# Patient Record
Sex: Male | Born: 1935 | Race: White | Hispanic: No | Marital: Married | Smoking: Former smoker
Health system: Southern US, Community
[De-identification: ages and names within clinical notes are randomized; demographics above are authoritative.]

## PROBLEM LIST (undated history)

## (undated) DIAGNOSIS — R569 Unspecified convulsions: Secondary | ICD-10-CM

## (undated) DIAGNOSIS — I10 Essential (primary) hypertension: Secondary | ICD-10-CM

## (undated) DIAGNOSIS — K5731 Diverticulosis of large intestine without perforation or abscess with bleeding: Secondary | ICD-10-CM

## (undated) DIAGNOSIS — C4490 Unspecified malignant neoplasm of skin, unspecified: Secondary | ICD-10-CM

## (undated) DIAGNOSIS — K5909 Other constipation: Secondary | ICD-10-CM

## (undated) DIAGNOSIS — N200 Calculus of kidney: Secondary | ICD-10-CM

## (undated) DIAGNOSIS — G454 Transient global amnesia: Secondary | ICD-10-CM

## (undated) DIAGNOSIS — K649 Unspecified hemorrhoids: Secondary | ICD-10-CM

## (undated) DIAGNOSIS — J189 Pneumonia, unspecified organism: Secondary | ICD-10-CM

## (undated) DIAGNOSIS — N400 Enlarged prostate without lower urinary tract symptoms: Secondary | ICD-10-CM

## (undated) DIAGNOSIS — F0390 Unspecified dementia without behavioral disturbance: Secondary | ICD-10-CM

## (undated) DIAGNOSIS — R350 Frequency of micturition: Secondary | ICD-10-CM

## (undated) DIAGNOSIS — F419 Anxiety disorder, unspecified: Secondary | ICD-10-CM

## (undated) DIAGNOSIS — I2699 Other pulmonary embolism without acute cor pulmonale: Secondary | ICD-10-CM

## (undated) HISTORY — PX: BLADDER SURGERY: SHX569

## (undated) HISTORY — PX: HEMORRHOID SURGERY: SHX153

## (undated) HISTORY — PX: SKIN CANCER EXCISION: SHX779

---

## 1973-10-16 HISTORY — PX: SUPRAPUBIC PROSTATECTOMY: SUR1056

## 1999-02-22 ENCOUNTER — Ambulatory Visit (HOSPITAL_COMMUNITY): Admission: RE | Admit: 1999-02-22 | Discharge: 1999-02-22 | Payer: Self-pay | Admitting: General Surgery

## 1999-02-22 ENCOUNTER — Encounter: Payer: Self-pay | Admitting: General Surgery

## 2001-02-15 ENCOUNTER — Encounter: Payer: Self-pay | Admitting: General Surgery

## 2001-02-15 ENCOUNTER — Encounter: Admission: RE | Admit: 2001-02-15 | Discharge: 2001-02-15 | Payer: Self-pay | Admitting: General Surgery

## 2001-02-28 ENCOUNTER — Inpatient Hospital Stay (HOSPITAL_COMMUNITY): Admission: RE | Admit: 2001-02-28 | Discharge: 2001-03-01 | Payer: Self-pay | Admitting: General Surgery

## 2001-06-22 ENCOUNTER — Inpatient Hospital Stay (HOSPITAL_COMMUNITY): Admission: EM | Admit: 2001-06-22 | Discharge: 2001-06-24 | Payer: Self-pay | Admitting: Urology

## 2001-06-22 ENCOUNTER — Encounter: Payer: Self-pay | Admitting: Urology

## 2008-02-16 ENCOUNTER — Emergency Department (HOSPITAL_COMMUNITY): Admission: EM | Admit: 2008-02-16 | Discharge: 2008-02-16 | Payer: Self-pay | Admitting: Emergency Medicine

## 2011-03-03 NOTE — Op Note (Signed)
[facility]  Patient:    [name], [name]                        MRN: [id number] Proc. Date: 02/28/01 Adm. Date:  [id number] Attending:  Janalyn Rouse                           Operative Report  PREOPERATIVE DIAGNOSES: 1. Left-sided spigelian hernia. 2. Chronic anal stricture.  POSTOPERATIVE DIAGNOSES: 1. Left-sided spigelian hernia. 2. Chronic anal stricture.  OPERATION: 1. Repair of spigelian hernia. 2. Left lateral internal sphincterotomy.  SURGEON:  Rose Phi. Maple Hudson, M.D.  ASSISTANT:  Andrey Spearman, M.D.  ANESTHESIA:  General.  DESCRIPTION OF PROCEDURE:  After suitable general anesthesia was induced, the patient was placed in a supine positive and abdomen prepped and draped in the usual fashion.  The bulge in the left side of his abdomen had been previously marked and with him prepped and draped, we made a transverse incision overlying this marked area.  I dissected through the subcutaneous tissue down to the external oblique fascia.  I then incised the lateral portion of the rectus fascia and then the external oblique fascia and then under this, we found the clear-cut defect representing the spigelian hernia.  It was not incarcerated.  Hernia was reduced, and he had good fascial and muscle margins which I then repaired with interrupted 2-0 Prolene sutures.  We then closed the external oblique fascia over this with 2-0 Prolene and closed the anterior fascia of the rectus muscle with similar sutures.  Then 3-0 Vicryl was used in the subcutaneous tissue and staples for the skin.  We then put him up in the lithotomy position and prepped and draped out the perineum.  He had a very tight anal sphincter and with some time and gentle dilation, I was able to get the little finger in followed by the index finger and then was able to get the large bullet rectal retractor in.  With him dilated like this I then, using a #11 knife blade, did a  lateral internal sphincterotomy.  Pressure controlled the bleeding.  I was able actually to put two fingers in following this.  I think we helped relieve the stricture.  A small dressing was applied, and the patient was transferred to the recovery room in satisfactory condition having tolerated the procedure well. DD:  02/28/01 TD:  02/28/01 Job: [id number]

## 2011-03-03 NOTE — H&P (Signed)
[facility]  Patient:    [name], [name] Visit Number: [id number] MRN: [id number]          Service Type: MED Location: 3W 0343 02 Attending Physician:  Londell Moh Dictated by:   Jamison Neighbor, M.D. Admit Date:  06/22/2001   CC:         Miguel Aschoff, M.D.   History and Physical  ADMITTING DIAGNOSES: 1. Dehydration. 2. Nausea. 3. Possible kidney stone.  HISTORY:  This 77 year old male passed his first kidney stone Wednesday of this past week.  The patient passed this without much difficulty, but has had some problems with intermittent nausea and pain since that time.  The patient presented to the [facility] outpatient clinic at [facility], where he was seen by Dr. Anner Crete.  She felt that the patient was dehydrated and had some flank pain and was concerned that perhaps the patient had another stone.  She obtained a KUB which did not show a stone.  She discussed this with the patients attending physician, Dr. Miguel Aschoff, who felt that the patient should be admitted by urology.  We made arrangements for the patient to be a direct admit to the [facility] for IV fluids and further evaluation.  At the patients initial assessment he did complain of mild nausea.  He had no hypertension and his urine, _________ , while microscopically bloody, was grossly normal.  PAST MEDICAL HISTORY:  Quite unremarkable.  The patient does have some chronic constipation and takes Miralax regularly.  He also takes Ultram p.r.n. for pain.  ALLERGIES:  He has an allergy to MORPHINE, _________ , INDOCIN, CODEINE, ATIVAN, and a medication that he called OMNICEN, which may be a penicillin.  Remarkable for hospitalization at age 88 for 13 days with pneumonia.  He had tonsils removed in 1946, hemorrhoids in 1969, suprapubic prostatectomy by Dr. Retia Passe, Sr. in 1975, eye surgery by Dr. Cecilie Kicks in 1989, history of a diverticulitis in 1994.   He underwent sphincterotomy in 1995.  He was admitted for an episode of transient global amnesia in 1996 and earlier this year, he had an abdominal hernia repair, as well as an internal sphincterotomy performed.  PHYSICAL EXAMINATION:  GENERAL:  On examination he is well-developed, well-nourished male in no acute distress.  HEENT:  Normocephalic, atraumatic.  Cranial nerves 2-12 appear grossly intact.  NECK:  Supple, no adenopathy or thyromegaly.  LUNGS:  Clear.  HEART:  Heart had a regular rate and rhythm.  No murmurs, thrills, gallops, rubs, or heaves.  ABDOMEN:  Soft, nontender, with no palpable masses, rebound, or guarding.  GENITALIA:  Testicles are normal size, shape, consistency.  EXTREMITIES:  No cyanosis, clubbing, or edema.  NEUROLOGIC:  Appears grossly intact.  LABORATORY:  CT scan has been ordered.  Laboratory studies have been ordered.  IMPRESSION: 1. Possible kidney stones. 2. Microscopic hematuria secondary to past kidney stone. 3. Residual flank pain and nausea secondary to passage of stone. Dictated by:   Jamison Neighbor, M.D. Attending Physician:  Londell Moh DD:  06/23/01 TD:  06/23/01 Job: [id number]

## 2011-03-03 NOTE — Discharge Summary (Signed)
[facility]  Patient:    [name], STILLE Visit Number: [id number] MRN: [id number]          Service Type: MED Location: 3W 0343 02 Attending Physician:  Londell Moh Dictated by:   Jamison Neighbor, M.D. Admit Date:  06/22/2001 Discharge Date: 06/24/2001   CC:         Charles A. Tenny Craw, M.D., [facility]   Discharge Summary  SERVICE:  Urology  DISCHARGE DIAGNOSES: 1. Dehydration. 2. Nausea. 3. Ureteral calculus.  HISTORY:  This 77 year old male passed the first kidney stone on Wednesday of the week of admission.  The patient has had problems with intermittent nausea and pain since that time.  Patient was seen by Dr. Anner Crete at the [facility] who felt that he was dehydrated.  She spoke with his attending physician, Dr. Bing Neighbors. Ross, who thought that he should be admitted by urology.  The patient was a direct admission to the hospital for IV fluids and further evaluation.  Patient had no hypertension and did have some mild nausea at the time of admission.  His urine was grossly normal but microscopically, he did have positive rbcs.  HOSPITAL COURSE:  Patient was admitted and started on IV fluids.  A CT scan was obtained which did show a stone in the distal ureter.  Patient did not have a lot of pain and elected to try and pass the stone on his own.  The patient was sent home to try and pass the stone without intervention at this time.  He does know that if he does not pass the stone in the next week or two, he will require ureteroscopic examination.  The patient was completely asymptomatic.  FOLLOWUP:  The patient will return to the office in one to two weeks time for followup.Dictated by:   Jamison Neighbor, M.D. Attending Physician:  Londell Moh DD:  07/02/01 TD:  07/02/01 Job: [id number]

## 2011-03-03 NOTE — Discharge Summary (Signed)
[facility]  Patient:    [name], [name]                        MRN: [id number] Adm. Date:  [id number] Disc. Date: [id number] Attending:  Janalyn Rouse                           Discharge Summary  HISTORY OF PRESENT ILLNESS:  This 77 year old male had a long history of chronic anal fissures with strictures secondary to hemorrhoidectomy done a long time ago.  On periodic occasions we have had to do sphincterotomies.  In addition, he has developed what appears to be a left spigelian hernia.  We have scheduled him now for repair of that and sphincterotomy.  There were no other physical findings.  His laboratory data was all within normal limits.  HOSPITAL COURSE:  Shortly after admission, he was taken to the operating room where a repair of a spigelian hernia on the left side was carried out and a lateral internal sphincterotomy.  Postoperatively, he did very well and was discharged the following day as an outpatient.  DISCHARGE DIAGNOSES: 1. Spigelian hernia. 2. Chronic anal stricture.  PROCEDURES: 1. Spigelian hernia repair. 2. Lateral internal sphincterotomy.  COMPLICATIONS:  None.  CONDITION ON DISCHARGE:  Improved.  DISCHARGE MEDICATIONS:  None.  DIET:  Regular.  ACTIVITY:  Limited.  FOLLOWUP:  To be seen in one week in the office. DD:  03/14/01 TD:  03/14/01 Job: [id number]

## 2012-09-19 ENCOUNTER — Emergency Department (HOSPITAL_COMMUNITY)
Admission: EM | Admit: 2012-09-19 | Discharge: 2012-09-19 | Discharge status: Against Medical Advice | Payer: Medicare Other | Attending: Emergency Medicine | Admitting: Emergency Medicine

## 2012-09-19 ENCOUNTER — Encounter (HOSPITAL_COMMUNITY): Payer: Self-pay | Admitting: *Deleted

## 2012-09-19 DIAGNOSIS — K6289 Other specified diseases of anus and rectum: Secondary | ICD-10-CM | POA: Insufficient documentation

## 2012-09-19 NOTE — ED Notes (Signed)
Pt signed discharge papers to accompany wife home.

## 2012-09-19 NOTE — ED Notes (Signed)
Pt c/o rectal pain that started "a good while ago but its getting worse and worse lately." pt denies blood in stool, reports pain with BM.

## 2012-09-27 ENCOUNTER — Emergency Department (HOSPITAL_COMMUNITY)
Admission: EM | Admit: 2012-09-27 | Discharge: 2012-09-27 | Disposition: A | Discharge status: Home / Self Care | Payer: Medicare Other | Attending: Emergency Medicine | Admitting: Emergency Medicine

## 2012-09-27 ENCOUNTER — Emergency Department (HOSPITAL_COMMUNITY): Payer: Medicare Other

## 2012-09-27 ENCOUNTER — Encounter (HOSPITAL_COMMUNITY): Payer: Self-pay | Admitting: Emergency Medicine

## 2012-09-27 DIAGNOSIS — R300 Dysuria: Secondary | ICD-10-CM | POA: Insufficient documentation

## 2012-09-27 DIAGNOSIS — Z79899 Other long term (current) drug therapy: Secondary | ICD-10-CM | POA: Insufficient documentation

## 2012-09-27 DIAGNOSIS — N390 Urinary tract infection, site not specified: Secondary | ICD-10-CM | POA: Insufficient documentation

## 2012-09-27 DIAGNOSIS — H9190 Unspecified hearing loss, unspecified ear: Secondary | ICD-10-CM | POA: Insufficient documentation

## 2012-09-27 DIAGNOSIS — R110 Nausea: Secondary | ICD-10-CM | POA: Insufficient documentation

## 2012-09-27 DIAGNOSIS — I10 Essential (primary) hypertension: Secondary | ICD-10-CM | POA: Insufficient documentation

## 2012-09-27 DIAGNOSIS — Z87448 Personal history of other diseases of urinary system: Secondary | ICD-10-CM | POA: Insufficient documentation

## 2012-09-27 DIAGNOSIS — F411 Generalized anxiety disorder: Secondary | ICD-10-CM | POA: Insufficient documentation

## 2012-09-27 HISTORY — DX: Essential (primary) hypertension: I10

## 2012-09-27 HISTORY — DX: Anxiety disorder, unspecified: F41.9

## 2012-09-27 LAB — URINALYSIS, ROUTINE W REFLEX MICROSCOPIC
Nitrite: NEGATIVE
Specific Gravity, Urine: 1.018 (ref 1.005–1.030)
Urobilinogen, UA: 0.2 mg/dL (ref 0.0–1.0)

## 2012-09-27 LAB — OCCULT BLOOD, POC DEVICE: Fecal Occult Bld: NEGATIVE

## 2012-09-27 LAB — COMPREHENSIVE METABOLIC PANEL
ALT: 15 U/L (ref 0–53)
Albumin: 3.5 g/dL (ref 3.5–5.2)
Alkaline Phosphatase: 34 U/L — ABNORMAL LOW (ref 39–117)
Potassium: 3.6 mEq/L (ref 3.5–5.1)
Sodium: 138 mEq/L (ref 135–145)
Total Protein: 5.9 g/dL — ABNORMAL LOW (ref 6.0–8.3)

## 2012-09-27 LAB — CBC WITH DIFFERENTIAL/PLATELET
Basophils Absolute: 0 10*3/uL (ref 0.0–0.1)
Basophils Relative: 0 % (ref 0–1)
Hemoglobin: 15 g/dL (ref 13.0–17.0)
MCHC: 33.1 g/dL (ref 30.0–36.0)
Neutro Abs: 6.6 10*3/uL (ref 1.7–7.7)
Neutrophils Relative %: 82 % — ABNORMAL HIGH (ref 43–77)
RDW: 14.5 % (ref 11.5–15.5)

## 2012-09-27 LAB — URINE MICROSCOPIC-ADD ON

## 2012-09-27 LAB — GLUCOSE, CAPILLARY: Glucose-Capillary: 87 mg/dL (ref 70–99)

## 2012-09-27 MED ORDER — SODIUM CHLORIDE 0.9 % IV BOLUS (SEPSIS)
1000.0000 mL | Freq: Once | INTRAVENOUS | Status: AC
  Administered 2012-09-27: 1000 mL via INTRAVENOUS

## 2012-09-27 MED ORDER — CEFUROXIME AXETIL 500 MG PO TABS: 500.0000 mg | ORAL_TABLET | Freq: Two times a day (BID) | ORAL | Status: DC

## 2012-09-27 MED ORDER — CEFTRIAXONE SODIUM 1 G IJ SOLR
1.0000 g | INTRAMUSCULAR | Status: DC
  Administered 2012-09-27: 1 g via INTRAVENOUS
  Filled 2012-09-27: qty 10

## 2012-09-27 NOTE — ED Notes (Signed)
Per EMS, patients states generalized weakness for a couple of days-"just doesn't feel well"

## 2012-09-27 NOTE — ED Notes (Signed)
CBG registered 87 on ED Glucometer 

## 2012-09-27 NOTE — ED Notes (Signed)
Pt aware of the need for a urine sample. Urinal at bedside. 

## 2012-09-27 NOTE — ED Provider Notes (Signed)
Complains of generalized weakness and "I feel sick" since 6 PM yesterday he denies chest pain denies cough denies fever denies abdominal pain denies pain anywhere no treatment prior to coming here bowel movements normal nothing makes symptoms better or worse. On exam pleasant cooperative Glasgow Coma Score 15 nontoxic-appearing HEENT exam mucous membranes dry. Eyes extraocular muscles intact no facial asymmetry neck supple no bruit lungs clear auscultation heart regular rate and no murmurs abdomen nondistended nontender all 4 extremities are redness or tenderness neurovascular intact neurologic cranial nerves II through XII grossly intact Glasgow Coma Score 15 moves all extremities gait normal. Becomes lightheaded upon standing  Doug Sou, MD 09/27/12 1139

## 2012-09-27 NOTE — ED Provider Notes (Signed)
History     CSN: [id number]  Arrival date & time 09/27/12  1039   First MD Initiated Contact with Patient 09/27/12 1052      Chief Complaint  Patient presents with  . Weakness    (Consider location/radiation/quality/duration/timing/severity/associated sxs/prior treatment) Patient is a 78 y.o. male presenting with weakness. The history is provided by the patient. No language interpreter was used.  Weakness The primary symptoms include nausea. Primary symptoms do not include headaches, loss of consciousness, seizures, dizziness, visual change, fever or vomiting.  Additional symptoms include weakness and hearing loss. Additional symptoms do not include pain or lower back pain. Medical issues do not include cancer or diabetes.   78 yo male with pmh of prostate problems, anxiety, and hypertension here via EMS from home with c/o generalized weakness since yesterday when he felt nauseated for a short while.  States that he thinks he has pneumonia.  Denies fever, SOB or cough, pain, blood in stool.   He takes care of his wife who had pagets disease and demnetia.  The neighbor came over to take care of her so he could come to the hospital.  He has a daughter in [place] who will come as soon as she can. He has an unsteady gait moving from EMS stretcher but does not use cane or walker at home.  Remote hx of pneumonia at 79 yo.   Normally has difficulty urinating.    Past Medical History  Diagnosis Date  . Hypertension   . Anxiety     No past surgical history on file.  No family history on file.  History  Substance Use Topics  . Smoking status: Not on file  . Smokeless tobacco: Not on file  . Alcohol Use: No      Review of Systems  Constitutional: Negative for fever.  HENT: Positive for hearing loss. Negative for congestion, sore throat, rhinorrhea, sneezing, trouble swallowing, neck pain and postnasal drip.   Eyes: Negative.   Respiratory: Negative.  Negative for cough and  shortness of breath.   Cardiovascular: Negative.  Negative for chest pain and leg swelling.  Gastrointestinal: Positive for nausea. Negative for vomiting, abdominal pain and blood in stool.  Genitourinary: Positive for difficulty urinating. Negative for dysuria, hematuria and testicular pain.  Neurological: Positive for weakness. Negative for dizziness, seizures, loss of consciousness, syncope, light-headedness and headaches.  Psychiatric/Behavioral: Negative.   All other systems reviewed and are negative.    Allergies  Review of patient's allergies indicates no known allergies.  Home Medications   Current Outpatient Rx  Name  Route  Sig  Dispense  Refill  . ALPRAZOLAM 0.5 MG PO TABS   Oral   Take 0.5 mg by mouth at bedtime as needed. ANXIETY/SLEEP         . AMLODIPINE BESYLATE 5 MG PO TABS   Oral   Take 5 mg by mouth daily.         Marland Kitchen LOSARTAN POTASSIUM 100 MG PO TABS   Oral   Take 100 mg by mouth daily.         Marland Kitchen TAMSULOSIN HCL 0.4 MG PO CAPS   Oral   Take 0.4 mg by mouth daily.         . TELMISARTAN 80 MG PO TABS   Oral   Take 80 mg by mouth daily.         . TRAMADOL HCL 50 MG PO TABS   Oral   Take 100 mg by mouth  daily.           BP 164/75  Pulse 69  Temp 98 F (36.7 C) (Oral)  Resp 18  SpO2 98%  Physical Exam  Nursing note and vitals reviewed. Constitutional: He is oriented to person, place, and time. He appears well-developed and well-nourished.  HENT:  Head: Normocephalic.  Eyes: Conjunctivae normal and EOM are normal. Pupils are equal, round, and reactive to light.  Neck: Normal range of motion. Neck supple. No tracheal deviation present. No thyromegaly present.  Cardiovascular: Normal rate.   Pulmonary/Chest: Effort normal and breath sounds normal. No respiratory distress. He has no wheezes. He has no rales.  Abdominal: Soft. Bowel sounds are normal. He exhibits no distension. There is no tenderness.  Musculoskeletal: Normal range of  motion.  Neurological: He is alert and oriented to person, place, and time.  Skin: Skin is warm and dry.  Psychiatric: He has a normal mood and affect.    ED Course  Procedures (including critical care time)   Labs Reviewed  GLUCOSE, CAPILLARY  CBC WITH DIFFERENTIAL  URINALYSIS, ROUTINE W REFLEX MICROSCOPIC  COMPREHENSIVE METABOLIC PANEL   No results found.   No diagnosis found.    MDM   Date: 09/27/2012  Rate: 69  Rhythm: normal sinus rhythm  QRS Axis: right  Intervals: normal  ST/T Wave abnormalities: normal  Conduction Disutrbances:first-degree A-V block   Narrative Interpretation:   Old EKG Reviewed: changes noted   Labs Reviewed  CBC WITH DIFFERENTIAL - Abnormal; Notable for the following:    Neutrophils Relative 82 (*)     Lymphocytes Relative 10 (*)     All other components within normal limits  COMPREHENSIVE METABOLIC PANEL - Abnormal; Notable for the following:    Total Protein 5.9 (*)     Alkaline Phosphatase 34 (*)     GFR calc non Af Amer 73 (*)     GFR calc Af Amer 85 (*)     All other components within normal limits  GLUCOSE, CAPILLARY  TROPONIN I  URINALYSIS, ROUTINE W REFLEX MICROSCOPIC  OCCULT BLOOD X 1 CARD TO LAB, STOOL   78 yo male with c/o "feeling weak".  + UTI with 7-10 and hematuria. Urine culture pending.  1gm IV rocephen in the er.  rx for ceftin.  He will follow up with Dr. Tenny Craw this week.  He understands to return to ER for fever, nausea and vomiting.   EKG and troponin unremarkable.  Chest x-ray unremarkable and reviewed by myself.  WBC 8.   Shared visit with Dr. Rennis Chris.   Labs Reviewed  CBC WITH DIFFERENTIAL - Abnormal; Notable for the following:    Neutrophils Relative 82 (*)     Lymphocytes Relative 10 (*)     All other components within normal limits  URINALYSIS, ROUTINE W REFLEX MICROSCOPIC - Abnormal; Notable for the following:    APPearance CLOUDY (*)     Hgb urine dipstick LARGE (*)     Ketones, ur TRACE (*)      Leukocytes, UA SMALL (*)     All other components within normal limits  COMPREHENSIVE METABOLIC PANEL - Abnormal; Notable for the following:    Total Protein 5.9 (*)     Alkaline Phosphatase 34 (*)     GFR calc non Af Amer 73 (*)     GFR calc Af Amer 85 (*)     All other components within normal limits  URINE MICROSCOPIC-ADD ON - Abnormal; Notable for the following:  Squamous Epithelial / LPF FEW (*)     Bacteria, UA FEW (*)     All other components within normal limits  GLUCOSE, CAPILLARY  TROPONIN I  OCCULT BLOOD, POC DEVICE  OCCULT BLOOD X 1 CARD TO LAB, STOOL  URINE CULTURE          Remi Haggard, NP 09/27/12 1903

## 2012-09-27 NOTE — ED Notes (Signed)
[facility] called  

## 2012-09-28 LAB — URINE CULTURE: Colony Count: 25000

## 2012-10-16 DIAGNOSIS — K5731 Diverticulosis of large intestine without perforation or abscess with bleeding: Secondary | ICD-10-CM

## 2012-10-16 HISTORY — DX: Diverticulosis of large intestine without perforation or abscess with bleeding: K57.31

## 2012-10-31 NOTE — ED Provider Notes (Signed)
Medical screening examination/treatment/procedure(s) were conducted as a shared visit with non-physician practitioner(s) and myself.  I personally evaluated the patient during the encounter  Doug Sou, MD 10/31/12 [phone]

## 2013-01-26 ENCOUNTER — Inpatient Hospital Stay (HOSPITAL_COMMUNITY)
Admission: EM | Admit: 2013-01-26 | Discharge: 2013-01-30 | DRG: 378 | Disposition: A | Discharge status: Home Health | Payer: Medicare Other | Attending: Internal Medicine | Admitting: Internal Medicine

## 2013-01-26 ENCOUNTER — Encounter (HOSPITAL_COMMUNITY): Payer: Self-pay | Admitting: Emergency Medicine

## 2013-01-26 DIAGNOSIS — L8991 Pressure ulcer of unspecified site, stage 1: Secondary | ICD-10-CM | POA: Diagnosis present

## 2013-01-26 DIAGNOSIS — D375 Neoplasm of uncertain behavior of rectum: Secondary | ICD-10-CM | POA: Diagnosis present

## 2013-01-26 DIAGNOSIS — D371 Neoplasm of uncertain behavior of stomach: Secondary | ICD-10-CM | POA: Diagnosis present

## 2013-01-26 DIAGNOSIS — K624 Stenosis of anus and rectum: Secondary | ICD-10-CM

## 2013-01-26 DIAGNOSIS — D62 Acute posthemorrhagic anemia: Secondary | ICD-10-CM | POA: Diagnosis present

## 2013-01-26 DIAGNOSIS — F0390 Unspecified dementia without behavioral disturbance: Secondary | ICD-10-CM | POA: Diagnosis present

## 2013-01-26 DIAGNOSIS — K5731 Diverticulosis of large intestine without perforation or abscess with bleeding: Secondary | ICD-10-CM

## 2013-01-26 DIAGNOSIS — L89899 Pressure ulcer of other site, unspecified stage: Secondary | ICD-10-CM | POA: Diagnosis present

## 2013-01-26 DIAGNOSIS — R5381 Other malaise: Secondary | ICD-10-CM | POA: Diagnosis present

## 2013-01-26 DIAGNOSIS — K5733 Diverticulitis of large intestine without perforation or abscess with bleeding: Principal | ICD-10-CM | POA: Diagnosis present

## 2013-01-26 DIAGNOSIS — K5900 Constipation, unspecified: Secondary | ICD-10-CM | POA: Diagnosis present

## 2013-01-26 DIAGNOSIS — I10 Essential (primary) hypertension: Secondary | ICD-10-CM | POA: Diagnosis present

## 2013-01-26 DIAGNOSIS — N138 Other obstructive and reflux uropathy: Secondary | ICD-10-CM | POA: Diagnosis present

## 2013-01-26 DIAGNOSIS — L89109 Pressure ulcer of unspecified part of back, unspecified stage: Secondary | ICD-10-CM | POA: Diagnosis present

## 2013-01-26 DIAGNOSIS — H9190 Unspecified hearing loss, unspecified ear: Secondary | ICD-10-CM | POA: Diagnosis present

## 2013-01-26 DIAGNOSIS — K922 Gastrointestinal hemorrhage, unspecified: Secondary | ICD-10-CM

## 2013-01-26 DIAGNOSIS — N401 Enlarged prostate with lower urinary tract symptoms: Secondary | ICD-10-CM | POA: Diagnosis present

## 2013-01-26 DIAGNOSIS — K5909 Other constipation: Secondary | ICD-10-CM | POA: Diagnosis present

## 2013-01-26 DIAGNOSIS — Z681 Body mass index (BMI) 19 or less, adult: Secondary | ICD-10-CM

## 2013-01-26 DIAGNOSIS — I9589 Other hypotension: Secondary | ICD-10-CM | POA: Diagnosis present

## 2013-01-26 HISTORY — DX: Benign prostatic hyperplasia without lower urinary tract symptoms: N40.0

## 2013-01-26 HISTORY — DX: Other constipation: K59.09

## 2013-01-26 LAB — CBC
HCT: 27.9 % — ABNORMAL LOW (ref 39.0–52.0)
Hemoglobin: 8.9 g/dL — ABNORMAL LOW (ref 13.0–17.0)
Hemoglobin: 9.4 g/dL — ABNORMAL LOW (ref 13.0–17.0)
MCH: 28.9 pg (ref 26.0–34.0)
MCH: 28.3 pg (ref 26.0–34.0)
MCH: 28.5 pg (ref 26.0–34.0)
MCHC: 32.2 g/dL (ref 30.0–36.0)
MCV: 90.6 fL (ref 78.0–100.0)
MCV: 87.5 fL (ref 78.0–100.0)
Platelets: 136 10*3/uL — ABNORMAL LOW (ref 150–400)
Platelets: 126 10*3/uL — ABNORMAL LOW (ref 150–400)
RBC: 3.08 MIL/uL — ABNORMAL LOW (ref 4.22–5.81)
RBC: 3.12 MIL/uL — ABNORMAL LOW (ref 4.22–5.81)
RDW: 15.7 % — ABNORMAL HIGH (ref 11.5–15.5)
RDW: 15.8 % — ABNORMAL HIGH (ref 11.5–15.5)
WBC: 7.6 10*3/uL (ref 4.0–10.5)
WBC: 8.1 10*3/uL (ref 4.0–10.5)

## 2013-01-26 LAB — COMPREHENSIVE METABOLIC PANEL
ALT: 8 U/L (ref 0–53)
AST: 13 U/L (ref 0–37)
CO2: 27 mEq/L (ref 19–32)
Calcium: 8.2 mg/dL — ABNORMAL LOW (ref 8.4–10.5)
Chloride: 110 mEq/L (ref 96–112)
Creatinine, Ser: 1.07 mg/dL (ref 0.50–1.35)
GFR calc Af Amer: 69 mL/min — ABNORMAL LOW (ref >90)
GFR calc non Af Amer: 59 mL/min — ABNORMAL LOW (ref >90)
Glucose, Bld: 113 mg/dL — ABNORMAL HIGH (ref 70–99)
Total Bilirubin: 0.2 mg/dL — ABNORMAL LOW (ref 0.3–1.2)

## 2013-01-26 LAB — PREPARE RBC (CROSSMATCH)

## 2013-01-26 LAB — MRSA PCR SCREENING: MRSA by PCR: NEGATIVE

## 2013-01-26 LAB — TSH: TSH: 1.081 u[IU]/mL (ref 0.350–4.500)

## 2013-01-26 MED ORDER — ONDANSETRON HCL 4 MG/2ML IJ SOLN: 4.0000 mg | Freq: Four times a day (QID) | INTRAMUSCULAR | Status: DC | PRN

## 2013-01-26 MED ORDER — ONDANSETRON HCL 4 MG PO TABS: 4.0000 mg | ORAL_TABLET | Freq: Four times a day (QID) | ORAL | Status: DC | PRN

## 2013-01-26 MED ORDER — DEXTROSE-NACL 5-0.9 % IV SOLN
INTRAVENOUS | Status: DC
  Administered 2013-01-26 – 2013-01-30 (×4): via INTRAVENOUS

## 2013-01-26 MED ORDER — TAMSULOSIN HCL 0.4 MG PO CAPS
0.4000 mg | ORAL_CAPSULE | Freq: Every day | ORAL | Status: DC
  Administered 2013-01-26 – 2013-01-30 (×5): 0.4 mg via ORAL
  Filled 2013-01-26 (×5): qty 1

## 2013-01-26 MED ORDER — LACTATED RINGERS IV BOLUS (SEPSIS)
1000.0000 mL | Freq: Once | INTRAVENOUS | Status: AC
  Administered 2013-01-26: 1000 mL via INTRAVENOUS

## 2013-01-26 MED ORDER — IRBESARTAN 300 MG PO TABS
300.0000 mg | ORAL_TABLET | Freq: Every day | ORAL | Status: DC
  Administered 2013-01-26 – 2013-01-27 (×2): 300 mg via ORAL
  Filled 2013-01-26 (×2): qty 1

## 2013-01-26 MED ORDER — TRAMADOL HCL 50 MG PO TABS
50.0000 mg | ORAL_TABLET | Freq: Three times a day (TID) | ORAL | Status: DC | PRN
Start: 2013-01-26 — End: 2013-01-30
  Administered 2013-01-26 – 2013-01-29 (×4): 50 mg via ORAL
  Filled 2013-01-26 (×5): qty 1

## 2013-01-26 MED ORDER — SODIUM CHLORIDE 0.9 % IJ SOLN
3.0000 mL | Freq: Two times a day (BID) | INTRAMUSCULAR | Status: DC
  Administered 2013-01-26 – 2013-01-27 (×2): 3 mL via INTRAVENOUS
  Administered 2013-01-27: 09:00:00 via INTRAVENOUS
  Administered 2013-01-28 – 2013-01-29 (×2): 3 mL via INTRAVENOUS

## 2013-01-26 MED ORDER — LOSARTAN POTASSIUM 50 MG PO TABS
100.0000 mg | ORAL_TABLET | Freq: Every day | ORAL | Status: DC
  Administered 2013-01-26 – 2013-01-30 (×5): 100 mg via ORAL
  Filled 2013-01-26 (×5): qty 2

## 2013-01-26 MED ORDER — AMLODIPINE BESYLATE 5 MG PO TABS
5.0000 mg | ORAL_TABLET | Freq: Every day | ORAL | Status: DC
  Administered 2013-01-26: 5 mg via ORAL
  Filled 2013-01-26 (×2): qty 1

## 2013-01-26 MED ORDER — ALPRAZOLAM 0.5 MG PO TABS
0.5000 mg | ORAL_TABLET | Freq: Three times a day (TID) | ORAL | Status: DC | PRN
  Administered 2013-01-26 – 2013-01-29 (×4): 0.5 mg via ORAL
  Filled 2013-01-26 (×4): qty 1

## 2013-01-26 NOTE — Consult Note (Signed)
Consult for [facility]  Reason for Consult:Hematochezia and Anemia Referring Physician: [facility]  Donia Ast HPI: This is an 79 year old male with complaints of hematochezia.  The bleeding was painless and he denied any issues with melena.  His HGB dropped from the 15 range in 09/2012 down to 8.9 on admission.  The bleeding started at 8 PM yesterday and it continues to this time.  In fact, he reports having a bloody bowel movement upon his arrival into his room a few minutes ago.  A few years ago he reports having a similar episode, but he does not recall the work up or any other details associated with the bleeding event.  In the past he reports having a negative colonoscopy, but he cannot recall the time or indication for the procedure.  Past Medical History  Diagnosis Date  . Hypertension   . Anxiety   . BPH (benign prostatic hypertrophy)   . Constipation, chronic     Past Surgical History  Procedure Laterality Date  . Prostate surgery    . Tonsilectomy/adenoidectomy with myringotomy      No family history on file.  Social History:  reports that he has never smoked. He has never used smokeless tobacco. He reports that he does not drink alcohol or use illicit drugs.  Allergies:  Allergies  Allergen Reactions  . Morphine And Related Nausea And Vomiting    Medications:  Scheduled: . amLODipine  5 mg Oral Daily  . irbesartan  300 mg Oral Daily  . losartan  100 mg Oral Daily  . sodium chloride  3 mL Intravenous Q12H  . tamsulosin  0.4 mg Oral Daily   Continuous: . dextrose 5 % and 0.9% NaCl      Results for orders placed during the hospital encounter of 01/26/13 (from the past 24 hour(s))  CBC     Status: Abnormal   Collection Time    01/26/13  3:30 AM      Result Value Range   WBC 7.6  4.0 - 10.5 K/uL   RBC 3.08 (*) 4.22 - 5.81 MIL/uL   Hemoglobin 8.9 (*) 13.0 - 17.0 g/dL   HCT 16.1 (*) 09.6 - 04.5 %   MCV 90.6  78.0 - 100.0 fL   MCH 28.9  26.0 - 34.0  pg   MCHC 31.9  30.0 - 36.0 g/dL   RDW 40.9  81.1 - 91.4 %   Platelets 136 (*) 150 - 400 K/uL  COMPREHENSIVE METABOLIC PANEL     Status: Abnormal   Collection Time    01/26/13  3:30 AM      Result Value Range   Sodium 143  135 - 145 mEq/L   Potassium 4.3  3.5 - 5.1 mEq/L   Chloride 110  96 - 112 mEq/L   CO2 27  19 - 32 mEq/L   Glucose, Bld 113 (*) 70 - 99 mg/dL   BUN 30 (*) 6 - 23 mg/dL   Creatinine, Ser 7.82  0.50 - 1.35 mg/dL   Calcium 8.2 (*) 8.4 - 10.5 mg/dL   Total Protein 4.9 (*) 6.0 - 8.3 g/dL   Albumin 2.9 (*) 3.5 - 5.2 g/dL   AST 13  0 - 37 U/L   ALT 8  0 - 53 U/L   Alkaline Phosphatase 33 (*) 39 - 117 U/L   Total Bilirubin 0.2 (*) 0.3 - 1.2 mg/dL   GFR calc non Af Amer 59 (*) >90 mL/min   GFR  calc Af Amer 69 (*) >90 mL/min  TYPE AND SCREEN     Status: None   Collection Time    01/26/13  3:30 AM      Result Value Range   ABO/RH(D) A POS     Antibody Screen NEG     Sample Expiration 01/29/2013     Unit Number [id number]     Blood Component Type RED CELLS,LR     Unit division 00     Status of Unit ALLOCATED     Transfusion Status OK TO TRANSFUSE     Crossmatch Result Compatible     Unit Number [id number]     Blood Component Type RED CELLS,LR     Unit division 00     Status of Unit ISSUED     Transfusion Status OK TO TRANSFUSE     Crossmatch Result Compatible    ABO/RH     Status: None   Collection Time    01/26/13  3:37 AM      Result Value Range   ABO/RH(D) A POS    PREPARE RBC (CROSSMATCH)     Status: None   Collection Time    01/26/13  5:00 AM      Result Value Range   Order Confirmation ORDER PROCESSED BY BLOOD BANK       No results found.  ROS:  As stated above in the HPI otherwise negative.  Blood pressure 163/74, pulse 64, temperature 98 F (36.7 C), temperature source Oral, resp. rate 24, height 5\' 11"  (1.803 m), weight 133 lb 6.1 oz (60.5 kg), SpO2 100.00%.    PE: Gen: NAD, Alert and Oriented HEENT:  [place]/AT, EOMI Neck: Supple, no  LAD Lungs: CTA Bilaterally CV: RRR without M/G/R ABM: Soft, NTND, +BS Ext: No C/C/E  Assessment/Plan: 1) Probable diverticular bleed. 2) Constipation. 3) HTN.   The patient's clinical presentation is consistent with a diverticular bleed.  I do not feel he needs an endoscopic evaluation at this time.  In 2002 his CT scan did reveal sigmoid diverticula.  Plan: 1) Follow HGB. 2) Transfuse if necessary.  Madiline Saffran D 01/26/2013, 9:02 AM

## 2013-01-26 NOTE — Progress Notes (Signed)
Subjective: Pt recently transferred to Dr Alphonsus Sias are in our practice in early FEB. Excellent eval by Dr Conley Rolls is appreciated. We are glad to take over care  Still passing blood, painlessly. No abd pain. No nausea. BP has been stable No CP or SOB. Not yet seen by GI. Hgb better after 1 unit transfusion   FROM OUR RECORDS: Past History Past Medical History (reviewed - no changes required): unstable gait  pneumonia  hemorrhoids ??  BPH rectal pain  Dementia Hypertension Recurrent UTI Colonoscopy History:        Results:  Has had 30 yrs ago   Td Vaccine: Done (05/15/1992 1:30:42 PM) Pneumovax: 2012 (10/17/2010 1:53:46 PM) Zostavax: Unsure (10/17/2010 1:53:46 PM)  Surgical History (reviewed - no changes required): hemorrhoidectomy ?? bladder surgery for bladder diverticulum 1975 suprapubic prostatectomy for BPH 1975  Family History (reviewed - no changes required): siblings: stomach CA, DM  Social History (reviewed - no changes required): married to helene Babe. retired Electronics engineer. college education  COLACE 50 MG CAPS (DOCUSATE SODIUM) take 1 daily  ALIGN 4 MG CAPS (PROBIOTIC PRODUCT) take 1 cap daily        ANUSOL-HC SUPP (HYDROCORTISONE ACETATE SUPP) BID        ALPRAZOLAM 0.5 MG TABS (ALPRAZOLAM) take 1 tab qhs prn  ANUSOL-HC 2.5 % CREA (HYDROCORTISONE) apply BID prn        LOSARTAN POTASSIUM 100 MG TABS (LOSARTAN POTASSIUM) take 1 tab daily  AMLODIPINE BESYLATE 5 MG TABS (AMLODIPINE BESYLATE) take 1 tab daily  FLOMAX 0.4 MG CAPS (TAMSULOSIN HCL) take 1 cap qhs  Objective: Vital signs in last 24 hours: Temp:  [97.5 F (36.4 C)-98.2 F (36.8 C)] 98 F (36.7 C) (04/13 0841) Pulse Rate:  [63-73] 64 (04/13 0841) Resp:  [14-24] 24 (04/13 0720) BP: (105-163)/(56-74) 163/74 mmHg (04/13 0841) SpO2:  [99 %-100 %] 100 % (04/13 0841) Weight:  [60.5 kg (133 lb 6.1 oz)] 60.5 kg (133 lb 6.1 oz) (04/13 0841)  Intake/Output from previous day: 04/12 0701 - 04/13 0700 In:  162.5 [Blood:162.5] Out: -  Intake/Output this shift: Total I/O In: 150 [Blood:150] Out: 201 [Urine:200; Stool:1]  Elderly WM sitting up in no distress. Sclera anicteric, face symmetric, neck supple. Lung clear no wheeze, no accessory ms in use. Ht regular, with murmur. abd soft non distended, sl overactive BS's. Awake, alert, clear speech  Lab Results   Recent Labs  01/26/13 0330  WBC 7.6  RBC 3.08*  HGB 8.9*  HCT 27.9*  MCV 90.6  MCH 28.9  RDW 14.5  PLT 136*    Recent Labs  01/26/13 0330  NA 143  K 4.3  CL 110  CO2 27  GLUCOSE 113*  BUN 30*  CREATININE 1.07  CALCIUM 8.2*    Studies/Results: No results found.  Scheduled Meds: . amLODipine  5 mg Oral Daily  . irbesartan  300 mg Oral Daily  . losartan  100 mg Oral Daily  . sodium chloride  3 mL Intravenous Q12H  . tamsulosin  0.4 mg Oral Daily   Continuous Infusions: . dextrose 5 % and 0.9% NaCl     PRN Meds:ALPRAZolam, ondansetron (ZOFRAN) IV, ondansetron  Assessment/Plan: Patient Active Problem List  Diagnosis  . Lower GI bleed: suspect this is diverticular. GI to see. hemodynamicaly stable but still bleeding. No pain.   Marland Kitchen HTN (hypertension): BP is holding OK   Dementia: seems fairly mild on brief exam. Paranoia noted in our records CODE STATUS: living will noted in  our records. FULL CODE per discussion with Dr. Conley Rolls      LOS: 0 days   Julian Hy 01/26/2013, 8:48 AM

## 2013-01-26 NOTE — ED Notes (Signed)
Attempted to call report, RN unable, to call back in about 10 minutes.

## 2013-01-26 NOTE — ED Notes (Signed)
Blood completed, no S/S of reaction.

## 2013-01-26 NOTE — Progress Notes (Signed)
Per Aundra Millet at [facility], Pt is in the independent living section of their facility.  Pt will not need CSW assistance unless he requires a higher level of care, such as SNF, upon d/c.  Please re-consult CSW, should CSW needs arise.  Providence Crosby, LCSWA Clinical Social Work [phone]

## 2013-01-26 NOTE — ED Notes (Signed)
[id number]<AV> Expected date:<BR> Expected time:<BR> Means of arrival:<BR> Comments:<BR> EMS/79 yo with rectal bleeding for the past 7 hours

## 2013-01-26 NOTE — ED Notes (Signed)
Spoke with pt's daughter, Sheppard Evens. She stated that the pt had a recent episode similar to this a few months ago and it was ruled as a hemorrhoid bleed. Daughter believes it may have been more. Also stated that pt has a new PCP, Ilean Skill with [facility] is faxing over info from the pt's recent physical. Asked daughter about pt's rxn with morphine. She stated that the pt gets very ill when he takes it. Contact info is as follows: C [phone] and H [phone].

## 2013-01-26 NOTE — Progress Notes (Signed)
Patient continues to have very bloody and black stools and starting to have blood clots in stool as well. Hgb dropped from 9.4 after 1st unit of blood today to 8.9 at 1434. Also, patient reported that he sticks something ("flusher") up his rectum at home to help himself have a bowel movement.  Paged Dr. Evlyn Kanner with this information. Given order for 2 units PRBCs, SCDS, place coude catheter, and for PT/OT eval and treat. Erskin Burnet RN

## 2013-01-26 NOTE — H&P (Signed)
[facility] History and Physical  [name]:[id number] DOB: [birth year]    PCP:   No primary provider on file.   Chief Complaint: Rectal bleeding.  HPI: [name] is an 79 y.o. male with hx of HTN, anxiety, chronic constipation, presents to the ER with several episodes of BRBPR.  He denied black stool.  He has no abdominal cramps or pain, nausea, vomiting, shortness of breath or chest pain.   He had another episode 2 years ago, but he didn't get colonoscopy at that time. His last lower endoscopy was many years ago and he didn't recall if anything was found. Evaluation in the ER showed Hb of 8.9 g/DL, BUN 30, normal renal fx tests, and Platelet count of 136K.  Hospitalist was asked to admit him for GI bleed, likely lower GI bleed.  He is getting one unit of PRBC.  Rewiew of Systems:  Constitutional: Negative for malaise, fever and chills. No significant weight loss or weight gain Eyes: Negative for eye pain, redness and discharge, diplopia, visual changes, or flashes of light. ENMT: Negative for ear pain, hoarseness, nasal congestion, sinus pressure and sore throat. No headaches; tinnitus, drooling, or problem swallowing. Cardiovascular: Negative for chest pain, palpitations, diaphoresis, dyspnea and peripheral edema. ; No orthopnea, PND Respiratory: Negative for cough, hemoptysis, wheezing and stridor. No pleuritic chestpain. Gastrointestinal: Negative for nausea, vomiting, diarrhea, constipation, abdominal pain, melena,  hematemesis, jaundice and rectal bleeding.    Genitourinary: Negative for frequency, dysuria, incontinence,flank pain and hematuria; Musculoskeletal: Negative for back pain and neck pain. Negative for swelling and trauma.;  Skin: . Negative for pruritus, rash, abrasions, bruising and skin lesion.; ulcerations Neuro: Negative for headache, lightheadedness and neck stiffness. Negative for weakness, altered level of consciousness , altered mental status,  extremity weakness, burning feet, involuntary movement, seizure and syncope.  Psych: negative for anxiety, depression, insomnia, tearfulness, panic attacks, hallucinations, paranoia, suicidal or homicidal ideation    Past Medical History  Diagnosis Date  . Hypertension   . Anxiety   . BPH (benign prostatic hypertrophy)   . Constipation, chronic     Past Surgical History  Procedure Laterality Date  . Prostate surgery    . Tonsilectomy/adenoidectomy with myringotomy      Medications:  HOME MEDS: Prior to Admission medications   Medication Sig Start Date End Date Taking? Authorizing Provider  ALPRAZolam Prudy Feeler) 0.5 MG tablet Take 0.5 mg by mouth at bedtime as needed. ANXIETY/SLEEP    Historical Provider, MD  amLODipine (NORVASC) 5 MG tablet Take 5 mg by mouth daily.    Historical Provider, MD  cefUROXime (CEFTIN) 500 MG tablet Take 1 tablet (500 mg total) by mouth 2 (two) times daily. 09/27/12   Remi Haggard, NP  losartan (COZAAR) 100 MG tablet Take 100 mg by mouth daily.    Historical Provider, MD  Tamsulosin HCl (FLOMAX) 0.4 MG CAPS Take 0.4 mg by mouth daily.    Historical Provider, MD  telmisartan (MICARDIS) 80 MG tablet Take 80 mg by mouth daily.    Historical Provider, MD  traMADol (ULTRAM) 50 MG tablet Take 100 mg by mouth daily.    Historical Provider, MD     Allergies:  Allergies  Allergen Reactions  . Morphine And Related     Social History:   reports that he has never smoked. He has never used smokeless tobacco. He reports that he does not drink alcohol or use illicit drugs.  Family History: No family history on file.   Physical Exam:  Filed Vitals:   01/26/13 0334 01/26/13 0505 01/26/13 0520  BP: 105/56 130/56 109/57  Pulse: 73 66 63  Temp: 97.9 F (36.6 C) 97.7 F (36.5 C) 97.5 F (36.4 C)  TempSrc: Oral Oral Oral  Resp: 20 15 14   SpO2: 99%     Blood pressure 109/57, pulse 63, temperature 97.5 F (36.4 C), temperature source Oral, resp. rate 14,  SpO2 99.00%.  GEN:  Pleasant patient lying in the stretcher in no acute distress; cooperative with exam. PSYCH:  alert and oriented x4; does not appear anxious or depressed; affect is appropriate. HEENT: Mucous membranes pink and anicteric; PERRLA; EOM intact; no cervical lymphadenopathy nor thyromegaly or carotid bruit; no JVD; There were no stridor. Neck is very supple. Breasts:: Not examined CHEST WALL: No tenderness CHEST: Normal respiration, clear to auscultation bilaterally.  HEART: Regular rate and rhythm.  There are no murmur, rub, or gallops.   BACK: No kyphosis or scoliosis; no CVA tenderness ABDOMEN: soft and non-tender; no masses, no organomegaly, normal abdominal bowel sounds; no pannus; no intertriginous candida. There is no rebound and no distention. Rectal Exam: Not done EXTREMITIES: No bone or joint deformity; age-appropriate arthropathy of the hands and knees; no edema; no ulcerations.  There is no calf tenderness. Genitalia: not examined PULSES: 2+ and symmetric SKIN: Normal hydration no rash or ulceration CNS: Cranial nerves 2-12 grossly intact no focal lateralizing neurologic deficit.  Speech is fluent; uvula elevated with phonation, facial symmetry and tongue midline. DTR are normal bilaterally, cerebella exam is intact, barbinski is negative and strengths are equaled bilaterally.  No sensory loss.   Labs on Admission:  Basic Metabolic Panel:  Recent Labs Lab 01/26/13 0330  NA 143  K 4.3  CL 110  CO2 27  GLUCOSE 113*  BUN 30*  CREATININE 1.07  CALCIUM 8.2*   Liver Function Tests:  Recent Labs Lab 01/26/13 0330  AST 13  ALT 8  ALKPHOS 33*  BILITOT 0.2*  PROT 4.9*  ALBUMIN 2.9*   No results found for this basename: LIPASE, AMYLASE,  in the last 168 hours No results found for this basename: AMMONIA,  in the last 168 hours CBC:  Recent Labs Lab 01/26/13 0330  WBC 7.6  HGB 8.9*  HCT 27.9*  MCV 90.6  PLT 136*   Cardiac Enzymes: No results  found for this basename: CKTOTAL, CKMB, CKMBINDEX, TROPONINI,  in the last 168 hours  CBG: No results found for this basename: GLUCAP,  in the last 168 hours   Radiological Exams on Admission: No results found.   Assessment/Plan Present on Admission:  . Lower GI bleed . HTN (hypertension) . Chronic constipation  PLAN:  Will admit him for lower GI bleed.  He likely will need lower endoscopy to see the source of his bleeding. This could be diverticular bleed, AVM, or even bleeding from malignancy given he had a 35# weight loss and given his advanced age.  He has been able to maintain hemodynamic stability with good blood pressure, so I have continue with his BP meds.  Will check his H/H every 6 hours.  He was type and cross and is getting one unit of PRBC.   I discussed his code status with him and his wife, and he would like to be a FULL CODE.  We will honor his wishes.  Thank you for allowing me to participate in the care of this very nice patient.  Other plans as per orders.  Code Status: FULL CODE.  Houston Siren, MD. [facility] Pager [phone] 7pm to 7am.  01/26/2013, 5:59 AM

## 2013-01-26 NOTE — ED Notes (Signed)
Per EMS, pt called for EMS at 2030 last night for bright red blood in the toilet and on the sheets. Decided not to come in at that time saying the bleeding had stopped. Called EMS back at 0300 for the same. Pt has hx of chronic constipation and takes rectal suppositories. Pt c/o dizziness when walking. Pt acts "sorta confused" which appeared to be his baseline. Pt's wife and [facility] staff never clarified if he has dementia.

## 2013-01-26 NOTE — ED Provider Notes (Signed)
History     CSN: [id number]  Arrival date & time 01/26/13  0330   First MD Initiated Contact with Patient 01/26/13 [phone]      Chief Complaint  Patient presents with  . Rectal Bleeding    HPI [name] is a 79 y.o. male with a past medical history significant for chronic constipation and hemorrhoids status post hemorrhoidectomy who presents with rectal bleeding. Patient had a couple episodes of bloody stools last night when the toilet and also in his bed. Patient was transported by EMS, complaining about some dizziness prior to transport, he said this was constant, has resolved now was worse at home, is worse when he was ambulating, he denies any chest pain or shortness of breath. Symptoms of bleeding had not occurred like this before, there is severe, intermittent, no alleviating or exacerbating factors. Patient has no rectal pain. No abdominal pain, no nausea vomiting or diarrhea.   Past Medical History  Diagnosis Date  . Hypertension   . Anxiety   . BPH (benign prostatic hypertrophy)   . Constipation, chronic     Past Surgical History  Procedure Laterality Date  . Prostate surgery    . Tonsilectomy/adenoidectomy with myringotomy      No family history on file.  History  Substance Use Topics  . Smoking status: Never Smoker   . Smokeless tobacco: Never Used  . Alcohol Use: No      Review of Systems At least 10pt or greater review of systems completed and are negative except where specified in the HPI.  Allergies  Morphine and related  Home Medications   Current Outpatient Rx  Name  Route  Sig  Dispense  Refill  . ALPRAZolam (XANAX) 0.5 MG tablet   Oral   Take 0.5 mg by mouth at bedtime as needed. ANXIETY/SLEEP         . amLODipine (NORVASC) 5 MG tablet   Oral   Take 5 mg by mouth daily.         . cefUROXime (CEFTIN) 500 MG tablet   Oral   Take 1 tablet (500 mg total) by mouth 2 (two) times daily.   20 tablet   0   . losartan (COZAAR) 100 MG  tablet   Oral   Take 100 mg by mouth daily.         . Tamsulosin HCl (FLOMAX) 0.4 MG CAPS   Oral   Take 0.4 mg by mouth daily.         Marland Kitchen telmisartan (MICARDIS) 80 MG tablet   Oral   Take 80 mg by mouth daily.         . traMADol (ULTRAM) 50 MG tablet   Oral   Take 100 mg by mouth daily.           BP 105/56  Pulse 73  Temp(Src) 97.9 F (36.6 C) (Oral)  Resp 20  SpO2 99%  Physical Exam  Nursing notes reviewed.  Electronic medical record reviewed. VITAL SIGNS:   Filed Vitals:   01/26/13 0520 01/26/13 0620 01/26/13 0621 01/26/13 0720  BP: 109/57 124/60  146/58  Pulse: 63 70  71  Temp: 97.5 F (36.4 C) 97.8 F (36.6 C) 97.8 F (36.6 C) 98.2 F (36.8 C)  TempSrc: Oral Oral Oral Oral  Resp: 14 22  24   SpO2:       CONSTITUTIONAL: Awake, oriented, pale, appears non-toxic, hard of hearing HENT: Atraumatic, normocephalic, oral mucosa pink and moist, airway patent. Nares patent  without drainage. External ears normal. EYES: Conjunctiva clear, EOMI, PERRLA NECK: Trachea midline, non-tender, supple CARDIOVASCULAR: Normal heart rate, Normal rhythm, No murmurs, rubs, gallops PULMONARY/CHEST: Clear to auscultation, no rhonchi, wheezes, or rales. Symmetrical breath sounds. Non-tender. ABDOMINAL: Non-distended, soft, non-tender - no rebound or guarding.  BS normal. RECTAL: Normal sphincter tone, gross dark red blood. NEUROLOGIC: Non-focal, moving all four extremities, no gross sensory or motor deficits. EXTREMITIES: No clubbing, cyanosis, or edema SKIN: Warm, Dry, No erythema, No rash  ED Course  Procedures (including critical care time)  Date: 01/26/2013  Rate: 69  Rhythm: normal sinus rhythm  QRS Axis: normal  Intervals: normal  ST/T Wave abnormalities: Q waves in anterior leads  Conduction Disutrbances: none  Narrative Interpretation: unremarkable - first degree AV block, likely age indeterminate anterior infarct, nothing new on this EKG is acute  Labs Reviewed   CBC - Abnormal; Notable for the following:    RBC 3.08 (*)    Hemoglobin 8.9 (*)    HCT 27.9 (*)    Platelets 136 (*)    All other components within normal limits  COMPREHENSIVE METABOLIC PANEL - Abnormal; Notable for the following:    Glucose, Bld 113 (*)    BUN 30 (*)    Calcium 8.2 (*)    Total Protein 4.9 (*)    Albumin 2.9 (*)    Alkaline Phosphatase 33 (*)    Total Bilirubin 0.2 (*)    GFR calc non Af Amer 59 (*)    GFR calc Af Amer 69 (*)    All other components within normal limits  TROPONIN I  TYPE AND SCREEN  ABO/RH  PREPARE RBC (CROSSMATCH)   No results found.   1. Lower GI bleed   2. Chronic constipation   3. HTN (hypertension)       MDM  [name] is a 79 y.o. male [name] with rectal bleeding. Patient had some soft blood pressures initially, is not a beta blocker, was never tachycardic, given a 1 L fluid bolus with lactated Ringer's, type and screen ordered, CBC found the patient's hemoglobin to be 8.9, I think that is active bleeding this will likely drift lower and I do think a transfusion is indicated at this time, I believe his hemoglobin will fall below 8 and we should not wait until it does - because the patient has been dizzy and has had soft blood pressures on admission.  LFTs c/w perhaps malnourishment? - decreased protein, albumin.  Calcium corrects with albumin to 9.1.  Patient's abdominal exam is benign, I don't see a rush to obtaining a CT at this time, patient may have a diverticular bleed however I do not think he's got diverticulitis, patient will need colonoscopy anyway - decision further imaging to the inpatient treatment team.  Pt stable for admission.  01/26/2013 5:56 AM Discussed with Dr. Conley Rolls for admission. Discussed with Dr. Elnoria Howard of GI who will consult.  Nurse was contacted by the daughter, patient recently switched primary care physicians to Dr. Sharon Mt - contacted [facility], Dr. Evlyn Kanner will see the  patient.       Edward Skene, MD 01/26/13 [phone]

## 2013-01-26 NOTE — Consult Note (Addendum)
Re:   [name] DOB:   02-Aug-1921 MRN:   [id number]  ASSESSMENT AND PLAN: 1.  GI bleed - presumably lower GI - presumably diverticular source.  (AVM or malignancy would other, though lower, possibilities.)  Seen and discussed with Dr. Elnoria Howard.  [facility] will assume consult tomorrow.  Discussed with Dr. Rinaldo Cloud.  I discussed options of treatment with daughter, Denice Bors.  Options: 1) Do nothing but support, 2) Radiology intervention, but I don't think that he is bleeding that briskly.  Could consider blood pool scan to try to identify the source, 3) Colonoscopy - low yield, but if it could prevent surgery may be beneficial, 4) Surgery - probably subtotal colectomy, unless a specific bleeding location was identified.  This is a very high risk operation in an older gentleman with failing health.  The daughter is reasonable, one of her sons is a neurologist and she wants to talk to him.  She is an only child.  I will move the patient to the step down ICU for tonight, start a second IV, and have blood available.  He is starting his 2nd and 3rd unit of blood.  I will also give some FFP.  2.  History of chronic constipation 3.  Hypertension 4.  BPH 5.  History of weight loss  Though since moving to [place], his weight has stabilized. 6.  Heart murmur - not previously characterized. 7.  Thrombocytopenia  Plts - 126,000 - 01/26/2013  Consumption vs chronic source. 8.  Was on aspirin 325 mg daily (possibly taking more according to his daughter) 61.  Very hard of hearing 10.  Probable early dementia 77.  Gait imbalance  Chief Complaint  Patient presents with  . Rectal Bleeding   REFERRING PHYSICIAN:  Dr. Rinaldo Cloud  HISTORY OF PRESENT ILLNESS: [name] is a 79 y.o. (DOB: April 18, 1921)  white  male whose primary care physician is Dr. Link Snuffer and was admitted to [facility] for hematochezia.  The patient had blood in his stools about 2 years ago.  He was at home and never sought medical  attention for this.  About 2 months ago he had some blood in his stools, but never sought medical attention for this.  Then last night, about 10 PM he had a bloody bowel movement and was [facility] to [facility] ER.  He thinks his last colonoscopy was >20 years ago.  He has no history of stomach disease.  No history of liver disease.  No history of gall bladder disease.  No history of pancreas disease.    He did have a bladder diverticula/prostate surgery around 1975 by Dr. Elige Radon.  He has a second left lower transverse scar on his abdomen which is for unknown reasons.   Past Medical History  Diagnosis Date  . Hypertension   . Anxiety   . BPH (benign prostatic hypertrophy)   . Constipation, chronic       Past Surgical History  Procedure Laterality Date  . Prostate surgery    . Tonsilectomy/adenoidectomy with myringotomy        Current Facility-Administered Medications  Medication Dose Route Frequency Provider Last Rate Last Dose  . ALPRAZolam Prudy Feeler) tablet 0.5 mg  0.5 mg Oral TID PRN Houston Siren, MD      . amLODipine (NORVASC) tablet 5 mg  5 mg Oral Daily Houston Siren, MD   5 mg at 01/26/13 0951  . dextrose 5 %-0.9 % sodium chloride infusion   Intravenous Continuous Theron Arista  Conley Rolls, MD 50 mL/hr at 01/26/13 0950    . irbesartan (AVAPRO) tablet 300 mg  300 mg Oral Daily Houston Siren, MD   300 mg at 01/26/13 0951  . losartan (COZAAR) tablet 100 mg  100 mg Oral Daily Houston Siren, MD   100 mg at 01/26/13 0951  . ondansetron (ZOFRAN) tablet 4 mg  4 mg Oral Q6H PRN Houston Siren, MD       Or  . ondansetron [facility]) injection 4 mg  4 mg Intravenous Q6H PRN Houston Siren, MD      . sodium chloride 0.9 % injection 3 mL  3 mL Intravenous Q12H Houston Siren, MD      . tamsulosin [facility]) capsule 0.4 mg  0.4 mg Oral Daily Houston Siren, MD   0.4 mg at 01/26/13 0951      Allergies  Allergen Reactions  . Morphine And Related Nausea And Vomiting    REVIEW OF SYSTEMS: Skin:  No history of rash.  No history of abnormal  moles. Infection:    No history of MRSA. Neurologic:  Early dementia.  His daughter did his taxes for him for the first time this past year.  He's not as organized or remembering things as well.  He has poor hearing and got hearing aids just 3 days ago. Cardiac:  Hypertension.  No history of heart disease.  No history of prior cardiac catheterization.  No history of seeing a cardiologist. Pulmonary:  Does not smoke cigarettes.  No asthma or bronchitis.  No OSA/CPAP.  Endocrine:  No diabetes. No thyroid disease. Gastrointestinal:  See HPI Urologic:  Suprapubic prostatectomy - 1975 and history of surgery for bladder diverticulum Musculoskeletal:  Unsteady gait.  Cause unclear. Hematologic:  On aspirin Psycho-social:  The patient is oriented.   He has poor hearing, which limits some communication.  SOCIAL and FAMILY HISTORY: Married, but wife has alzheimer's disease. They have lived at [facility] since Dec 2013. Daughter, Denice Bors, is in the room.  She is from [place], [place].  She is an only child.  Her phone: [phone].  She has a son who is a Insurance account manager. [I spoke to [employer], neurologist, in [place].  [phone].  He is Mr. Vanness grandson.]  PHYSICAL EXAM: BP 128/51  Pulse 72  Temp(Src) 98.5 F (36.9 C) (Oral)  Resp 18  Ht 5\' 11"  (1.803 m)  Wt 133 lb 6.1 oz (60.5 kg)  BMI 18.61 kg/m2  SpO2 100%  General: Older WM who is alert.  He is hard of hearing. HEENT: Normal. Pupils equal. Neck: Supple. No mass.  No thyroid mass. Lymph Nodes:  No supraclavicular or cervical nodes. Lungs: Clear to auscultation and symmetric breath sounds. Heart:  RRR. He has a 3/6 systolic murmur. Abdomen: Soft. No mass. No tenderness. No hernia. Normal bowel sounds.  He has a lower midline scar and a left lower transverse abdominal scar. Rectal: Not done. Extremities:  Good strength and ROM  in upper and lower extremities. Neurologic:  Grossly intact to motor and sensory function. Psychiatric:   Behavior is normal.   DATA REVIEWED: Reviewed notes in Epic. Discussed with Drs. Hung/South.  Ovidio Kin, MD,  [facility] [address] [place]  [address]   [place], [place]    [address] Phone:  [phone] FAX:  [phone]

## 2013-01-27 DIAGNOSIS — K922 Gastrointestinal hemorrhage, unspecified: Secondary | ICD-10-CM

## 2013-01-27 LAB — CBC WITH DIFFERENTIAL/PLATELET
Basophils Relative: 0 % (ref 0–1)
HCT: 31.8 % — ABNORMAL LOW (ref 39.0–52.0)
Hemoglobin: 10.6 g/dL — ABNORMAL LOW (ref 13.0–17.0)
Lymphocytes Relative: 18 % (ref 12–46)
Lymphs Abs: 1.4 10*3/uL (ref 0.7–4.0)
Monocytes Absolute: 0.6 10*3/uL (ref 0.1–1.0)
Monocytes Relative: 8 % (ref 3–12)
Neutro Abs: 5.5 10*3/uL (ref 1.7–7.7)
Neutrophils Relative %: 73 % (ref 43–77)
RBC: 3.69 MIL/uL — ABNORMAL LOW (ref 4.22–5.81)
WBC: 7.5 10*3/uL (ref 4.0–10.5)

## 2013-01-27 LAB — PROTIME-INR
INR: 1.06 (ref 0.00–1.49)
Prothrombin Time: 13.7 seconds (ref 11.6–15.2)

## 2013-01-27 LAB — BASIC METABOLIC PANEL
CO2: 30 mEq/L (ref 19–32)
Chloride: 107 mEq/L (ref 96–112)
Creatinine, Ser: 0.87 mg/dL (ref 0.50–1.35)
GFR calc Af Amer: 86 mL/min — ABNORMAL LOW (ref >90)
Sodium: 143 mEq/L (ref 135–145)

## 2013-01-27 LAB — HEMOGLOBIN AND HEMATOCRIT, BLOOD: Hemoglobin: 10.7 g/dL — ABNORMAL LOW (ref 13.0–17.0)

## 2013-01-27 MED ORDER — PEG-KCL-NACL-NASULF-NA ASC-C 100 G PO SOLR: 1.0000 | Freq: Once | ORAL | Status: DC

## 2013-01-27 MED ORDER — AMLODIPINE BESYLATE 5 MG PO TABS
5.0000 mg | ORAL_TABLET | Freq: Every day | ORAL | Status: AC
  Administered 2013-01-27: 5 mg via ORAL
  Filled 2013-01-27: qty 1

## 2013-01-27 MED ORDER — BIOTENE DRY MOUTH MT LIQD
15.0000 mL | Freq: Two times a day (BID) | OROMUCOSAL | Status: DC
Start: 2013-01-27 — End: 2013-01-30
  Administered 2013-01-27 – 2013-01-29 (×5): 15 mL via OROMUCOSAL

## 2013-01-27 MED ORDER — CHLORHEXIDINE GLUCONATE 0.12 % MT SOLN
15.0000 mL | Freq: Two times a day (BID) | OROMUCOSAL | Status: DC
  Administered 2013-01-28 – 2013-01-30 (×4): 15 mL via OROMUCOSAL
  Filled 2013-01-27 (×7): qty 15

## 2013-01-27 MED ORDER — LOSARTAN POTASSIUM 50 MG PO TABS
100.0000 mg | ORAL_TABLET | Freq: Every day | ORAL | Status: AC
  Administered 2013-01-27: 100 mg via ORAL
  Filled 2013-01-27: qty 2

## 2013-01-27 MED ORDER — PEG-KCL-NACL-NASULF-NA ASC-C 100 G PO SOLR
0.5000 | Freq: Once | ORAL | Status: DC
  Filled 2013-01-27: qty 1

## 2013-01-27 MED ORDER — PEG-KCL-NACL-NASULF-NA ASC-C 100 G PO SOLR
0.5000 | Freq: Once | ORAL | Status: AC
  Administered 2013-01-28: 50 g via ORAL
  Filled 2013-01-27 (×2): qty 1

## 2013-01-27 MED ORDER — AMLODIPINE BESYLATE 10 MG PO TABS
10.0000 mg | ORAL_TABLET | Freq: Every day | ORAL | Status: DC
  Administered 2013-01-27 – 2013-01-29 (×3): 10 mg via ORAL
  Filled 2013-01-27 (×3): qty 1

## 2013-01-27 MED ORDER — ADULT MULTIVITAMIN W/MINERALS CH
1.0000 | ORAL_TABLET | Freq: Every day | ORAL | Status: DC
  Administered 2013-01-27 – 2013-01-30 (×4): 1 via ORAL
  Filled 2013-01-27 (×4): qty 1

## 2013-01-27 MED ORDER — BOOST / RESOURCE BREEZE PO LIQD
1.0000 | Freq: Two times a day (BID) | ORAL | Status: DC
  Administered 2013-01-27: 1 via ORAL

## 2013-01-27 NOTE — Progress Notes (Signed)
CARE MANAGEMENT NOTE 01/27/2013  Patient:  [name], [name]   Account Number:  [id number]  Date Initiated:  01/27/2013  Documentation initiated by:  DAVIS,RHONDA  Subjective/Objective Assessment:   pt with history and recent occurance of brrb, dropped in hgb by two grams, dropped pressure, multiple medical problems     Action/Plan:   home   Anticipated DC Date:  01/30/2013   Anticipated DC Plan:  HOME/SELF CARE  In-house referral  NA      DC Planning Services  NA      [facility] Choice  NA   Choice offered to / List presented to:  NA   DME arranged  NA      DME agency  NA     HH arranged  NA      HH agency  NA   Status of service:  In process, will continue to follow Medicare Important Message given?  NA - LOS <3 / Initial given by admissions (If response is "NO", the following Medicare IM given date fields will be blank) Date Medicare IM given:   Date Additional Medicare IM given:    Discharge Disposition:    Per UR Regulation:  Reviewed for med. necessity/level of care/duration of stay  If discussed at Long Length of Stay Meetings, dates discussed:    Comments:  [id number] Earlene Plater, RN, BSN, CCM:  CHART REVIEWED AND UPDATED.  Next chart review due on [id number]. NO DISCHARGE NEEDS PRESENT AT THIS TIME. CASE MANAGEMENT [phone]

## 2013-01-27 NOTE — Evaluation (Signed)
Occupational Therapy Evaluation Patient Details Name: [name] MRN: [id number] DOB: Oct 14, 1921 Today's Date: 01/27/2013 Time: 4098-1191 OT Time Calculation (min): 24 min  OT Assessment / Plan / Recommendation Clinical Impression  Pt admitted with GIB.  Moving well but with generalized weakness.  Will follow. Anticipate pt will be able to return home with 24 hour care.    OT Assessment  Patient needs continued OT Services    Follow Up Recommendations  Supervision/Assistance - 24 hour;Home health OT    Barriers to Discharge      Equipment Recommendations  None recommended by OT    Recommendations for Other Services    Frequency  Min 3X/week    Precautions / Restrictions Precautions Precautions: Fall Restrictions Weight Bearing Restrictions: No   Pertinent Vitals/Pain No pain    ADL  Eating/Feeding: Independent Where Assessed - Eating/Feeding: Chair Grooming: Wash/dry hands;Min guard Where Assessed - Grooming: Unsupported standing Upper Body Bathing: Supervision/safety Where Assessed - Upper Body Bathing: Unsupported sitting Lower Body Bathing: Minimal assistance Where Assessed - Lower Body Bathing: Unsupported sitting Upper Body Dressing: Supervision/safety Where Assessed - Upper Body Dressing: Unsupported sitting Lower Body Dressing: Minimal assistance Where Assessed - Lower Body Dressing: Unsupported sitting;Supported sit to stand Toilet Transfer: Minimal assistance (to recliner) Equipment Used: Gait belt;Rolling walker Transfers/Ambulation Related to ADLs: min assist with RW ADL Comments: Able to easily access feet.    OT Diagnosis: Generalized weakness  OT Problem List: Decreased activity tolerance;Impaired balance (sitting and/or standing) OT Treatment Interventions: Self-care/ADL training;Energy conservation;DME and/or AE instruction;Patient/family education   OT Goals Acute Rehab OT Goals OT Goal Formulation: With patient Time For Goal Achievement:  02/03/13 Potential to Achieve Goals: Good ADL Goals Pt Will Perform Grooming: with supervision;Standing at sink (3 activities) ADL Goal: Grooming - Progress: Goal set today Pt Will Perform Lower Body Bathing: with supervision;Sit to stand from bed ADL Goal: Lower Body Bathing - Progress: Goal set today Pt Will Perform Lower Body Dressing: with supervision;Sit to stand from bed ADL Goal: Lower Body Dressing - Progress: Goal set today Pt Will Transfer to Toilet: with supervision;Ambulation;with DME ADL Goal: Toilet Transfer - Progress: Goal set today Pt Will Perform Toileting - Clothing Manipulation: with supervision;Standing ADL Goal: Toileting - Clothing Manipulation - Progress: Goal set today Miscellaneous OT Goals Miscellaneous OT Goal #1: Pt will generalize energy conservation techniques in ADL and mobility. OT Goal: Miscellaneous Goal #1 - Progress: Goal set today  Visit Information  Last OT Received On: 01/27/13 Assistance Needed: +1 PT/OT Co-Evaluation/Treatment: Yes    Subjective Data  Subjective: "It feels good to walk." Patient Stated Goal: Home   Prior Functioning     Home Living Lives With: Spouse (wife has dementia) Available Help at Discharge: Personal care attendant (4-5 hours a day, could have 24 hr) Type of Home: Apartment Home Access: Level entry Home Layout: One level Bathroom Shower/Tub: Health visitor: Handicapped height Bathroom Accessibility: Yes How Accessible: Accessible via walker Home Adaptive Equipment: Shower chair with back;Grab bars around toilet;Grab bars in shower;Walker - rolling Prior Function Level of Independence: Needs assistance Needs Assistance: Light Housekeeping;Meal Prep Meal Prep: Total Light Housekeeping: Total Vocation: Retired Comments: moved to [employer] 4 months ago, use a walker right before admission, was receiving PT with in house therapists Communication Communication: HOH Dominant Hand: Right          Vision/Perception Vision - History Baseline Vision: Wears glasses all the time   Cognition  Cognition Overall Cognitive Status: Appears within functional limits  for tasks assessed/performed Arousal/Alertness: Awake/alert Orientation Level: Appears intact for tasks assessed Behavior During Session: [facility] for tasks performed    Extremity/Trunk Assessment Right Upper Extremity Assessment RUE ROM/Strength/Tone: Within functional levels RUE Coordination: WFL - gross/fine motor Left Upper Extremity Assessment LUE ROM/Strength/Tone: Within functional levels LUE Coordination: WFL - gross/fine motor  Trunk Assessment Trunk Assessment: Kyphotic     Mobility Bed Mobility Bed Mobility: Supine to Sit Supine to Sit: 4: Min guard;HOB flat;With rails Details for Bed Mobility Assistance: Min/guard for safety of trunk getting to EOB.  Min cues for hand placement and technique.  Transfers Transfers: Sit to Stand;Stand to Sit Sit to Stand: 4: Min guard;With upper extremity assist;From bed Stand to Sit: 4: Min guard;With upper extremity assist;With armrests;To chair/3-in-1 Details for Transfer Assistance: Min/guard for safety with cues for hand placement and safety.      Exercise     Balance     End of Session OT - End of Session Activity Tolerance: Patient limited by fatigue Patient left: in chair;with call bell/phone within reach;with family/visitor present  GO     Evern Bio 01/27/2013, 8:49 AM [phone]

## 2013-01-27 NOTE — Evaluation (Signed)
Physical Therapy Evaluation Patient Details Name: [name] MRN: [id number] DOB: [birth year] Today's Date: 01/27/2013 Time: 5621-3086 PT Time Calculation (min): 24 min  PT Assessment / Plan / Recommendation Clinical Impression  Pt presents with lower GIB and is s/p 3 blood transfusions.  It is unclear at this time source of GIB, however they feel it is diverticular in nature.  Tolerated OOB and ambulation in hallway well with RW at min assist for noted imbalances and unsteadiness.  Pt will benefit from skilled PT in acute venue to address deficits.  PT recommends HHPT for follow up with 24/7 care/supervision.      PT Assessment  Patient needs continued PT services    Follow Up Recommendations  Home health PT    Does the patient have the potential to tolerate intense rehabilitation      Barriers to Discharge None      Equipment Recommendations  None recommended by PT    Recommendations for Other Services     Frequency Min 3X/week    Precautions / Restrictions Precautions Precautions: Fall Restrictions Weight Bearing Restrictions: No   Pertinent Vitals/Pain No stated pain      Mobility  Bed Mobility Bed Mobility: Supine to Sit Supine to Sit: 4: Min guard;HOB flat;With rails Details for Bed Mobility Assistance: Min/guard for safety of trunk getting to EOB.  Min cues for hand placement and technique.  Transfers Transfers: Sit to Stand;Stand to Sit Sit to Stand: 4: Min guard;With upper extremity assist;From bed Stand to Sit: 4: Min guard;With upper extremity assist;With armrests;To chair/3-in-1 Details for Transfer Assistance: Min/guard for safety with cues for hand placement and safety.  Ambulation/Gait Ambulation/Gait Assistance: 4: Min assist Ambulation Distance (Feet): 160 Feet Assistive device: Rolling walker Ambulation/Gait Assistance Details: Cues for maintaining position inside of RW and to maintain upright posture throughout.  Gait Pattern: Step-through  pattern;Decreased stride length;Trunk flexed Gait velocity: decreased Stairs: No Wheelchair Mobility Wheelchair Mobility: No    Exercises     PT Diagnosis: Difficulty walking;Generalized weakness;Acute pain  PT Problem List: Decreased strength;Decreased activity tolerance;Decreased balance;Decreased mobility;Decreased knowledge of use of DME PT Treatment Interventions: DME instruction;Gait training;Functional mobility training;Therapeutic activities;Balance training;Therapeutic exercise;Patient/family education   PT Goals Acute Rehab PT Goals PT Goal Formulation: With patient/family Time For Goal Achievement: 02/03/13 Potential to Achieve Goals: Good Pt will go Supine/Side to Sit: with supervision PT Goal: Supine/Side to Sit - Progress: Goal set today Pt will go Sit to Supine/Side: with supervision PT Goal: Sit to Supine/Side - Progress: Goal set today Pt will go Sit to Stand: with supervision PT Goal: Sit to Stand - Progress: Goal set today Pt will go Stand to Sit: with supervision PT Goal: Stand to Sit - Progress: Goal set today Pt will Ambulate: 51 - 150 feet;with supervision;with least restrictive assistive device PT Goal: Ambulate - Progress: Goal set today Pt will Perform Home Exercise Program: with supervision, verbal cues required/provided PT Goal: Perform Home Exercise Program - Progress: Goal set today  Visit Information  Last PT Received On: 01/27/13 Assistance Needed: +1    Subjective Data  Subjective: I'm hungry.  Patient Stated Goal: to get back home to my wife.    Prior Functioning  Home Living Lives With: Spouse (wife has dementia) Available Help at Discharge: Personal care attendant (4-5 hours a day, could have 24 hr) Type of Home: Apartment Home Access: Level entry Home Layout: One level Bathroom Shower/Tub: Health visitor: Handicapped height Bathroom Accessibility: Yes How Accessible: Accessible via walker  Home Adaptive Equipment:  Shower chair with back;Grab bars around toilet;Grab bars in shower;Walker - rolling Prior Function Level of Independence: Needs assistance Needs Assistance: Light Housekeeping;Meal Prep Meal Prep: Total Light Housekeeping: Total Vocation: Retired Comments: moved to [place] 4 months ago, use a walker right before admission, was receiving PT with in house therapists Communication Communication: HOH Dominant Hand: Right    Cognition  Cognition Overall Cognitive Status: Appears within functional limits for tasks assessed/performed Arousal/Alertness: Awake/alert Orientation Level: Appears intact for tasks assessed Behavior During Session: [facility] for tasks performed    Extremity/Trunk Assessment Right Upper Extremity Assessment RUE ROM/Strength/Tone: Within functional levels RUE Coordination: WFL - gross/fine motor Left Upper Extremity Assessment LUE ROM/Strength/Tone: Within functional levels LUE Coordination: WFL - gross/fine motor Right Lower Extremity Assessment RLE ROM/Strength/Tone: Deficits RLE ROM/Strength/Tone Deficits: Grossly WFL per functional assessment RLE Sensation: WFL - Light Touch Left Lower Extremity Assessment LLE ROM/Strength/Tone: Deficits LLE ROM/Strength/Tone Deficits: Grossly WFL per functional assessment.  Trunk Assessment Trunk Assessment: Kyphotic   Balance    End of Session PT - End of Session Equipment Utilized During Treatment: Gait belt Activity Tolerance: Patient limited by fatigue Patient left: in chair;with call bell/phone within reach;with family/visitor present Nurse Communication: Mobility status  GP     Vista Deck 01/27/2013, 8:51 AM

## 2013-01-27 NOTE — Progress Notes (Signed)
Pt BP elevated in 160-170s, with MAPs over 100.  On call MD for [facility]  notified and extra dose 100mg  coazar ordered and given. Next BPs were 130-160s range.

## 2013-01-27 NOTE — Progress Notes (Signed)
Patient ID: [name], male   DOB: 02-Apr-1921, 79 y.o.   MRN: [id number] [facility] Surgery Progress Note:   * No surgery found *  Subjective: Mental status is effected by lack of hearing aid Objective: Vital signs in last 24 hours: Temp:  [94.4 F (34.7 C)-98.9 F (37.2 C)] 98.6 F (37 C) (04/14 0800) Pulse Rate:  [61-81] 73 (04/14 0900) Resp:  [13-26] 26 (04/14 0900) BP: (118-186)/(51-88) 154/56 mmHg (04/14 0900) SpO2:  [97 %-100 %] 99 % (04/14 0900) Weight:  [134 lb 0.6 oz (60.8 kg)] 134 lb 0.6 oz (60.8 kg) (04/13 2140)  Intake/Output from previous day: 04/13 0701 - 04/14 0700 In: 4131.3 [P.O.:1323; I.V.:1408.3; Blood:1400] Out: 3630 [Urine:3630] Intake/Output this shift: Total I/O In: 100 [I.V.:100] Out: 1000 [Urine:1000]  Physical Exam: Work of breathing is normal.  No current bleeding per rectum.    Lab Results:  Results for orders placed during the hospital encounter of 01/26/13 (from the past 48 hour(s))  CBC     Status: Abnormal   Collection Time    01/26/13  3:30 AM      Result Value Range   WBC 7.6  4.0 - 10.5 K/uL   RBC 3.08 (*) 4.22 - 5.81 MIL/uL   Hemoglobin 8.9 (*) 13.0 - 17.0 g/dL   HCT 40.9 (*) 81.1 - 91.4 %   MCV 90.6  78.0 - 100.0 fL   MCH 28.9  26.0 - 34.0 pg   MCHC 31.9  30.0 - 36.0 g/dL   RDW 78.2  95.6 - 21.3 %   Platelets 136 (*) 150 - 400 K/uL  COMPREHENSIVE METABOLIC PANEL     Status: Abnormal   Collection Time    01/26/13  3:30 AM      Result Value Range   Sodium 143  135 - 145 mEq/L   Potassium 4.3  3.5 - 5.1 mEq/L   Chloride 110  96 - 112 mEq/L   CO2 27  19 - 32 mEq/L   Glucose, Bld 113 (*) 70 - 99 mg/dL   BUN 30 (*) 6 - 23 mg/dL   Creatinine, Ser 0.86  0.50 - 1.35 mg/dL   Calcium 8.2 (*) 8.4 - 10.5 mg/dL   Total Protein 4.9 (*) 6.0 - 8.3 g/dL   Albumin 2.9 (*) 3.5 - 5.2 g/dL   AST 13  0 - 37 U/L   ALT 8  0 - 53 U/L   Alkaline Phosphatase 33 (*) 39 - 117 U/L   Total Bilirubin 0.2 (*) 0.3 - 1.2 mg/dL   GFR calc non Af  Amer 59 (*) >90 mL/min   GFR calc Af Amer 69 (*) >90 mL/min   Comment:            The eGFR has been calculated     using the CKD EPI equation.     This calculation has not been     validated in all clinical     situations.     eGFR's persistently     <90 mL/min signify     possible Chronic Kidney Disease.  TYPE AND SCREEN     Status: None   Collection Time    01/26/13  3:30 AM      Result Value Range   ABO/RH(D) A POS     Antibody Screen NEG     Sample Expiration 01/29/2013     Unit Number [id number]     Blood Component Type RED CELLS,LR     Unit division  00     Status of Unit ISSUED,FINAL     Transfusion Status OK TO TRANSFUSE     Crossmatch Result Compatible     Unit Number [id number]     Blood Component Type RED CELLS,LR     Unit division 00     Status of Unit ISSUED,FINAL     Transfusion Status OK TO TRANSFUSE     Crossmatch Result Compatible     Unit Number [id number]     Blood Component Type RED CELLS,LR     Unit division 00     Status of Unit ALLOCATED     Transfusion Status OK TO TRANSFUSE     Crossmatch Result Compatible     Unit Number [id number]     Blood Component Type RED CELLS,LR     Unit division 00     Status of Unit ISSUED,FINAL     Transfusion Status OK TO TRANSFUSE     Crossmatch Result Compatible     Unit Number [id number]     Blood Component Type RED CELLS,LR     Unit division 00     Status of Unit ALLOCATED     Transfusion Status OK TO TRANSFUSE     Crossmatch Result Compatible    ABO/RH     Status: None   Collection Time    01/26/13  3:37 AM      Result Value Range   ABO/RH(D) A POS    PREPARE RBC (CROSSMATCH)     Status: None   Collection Time    01/26/13  5:00 AM      Result Value Range   Order Confirmation ORDER PROCESSED BY BLOOD BANK    TROPONIN I     Status: None   Collection Time    01/26/13  9:05 AM      Result Value Range   Troponin I <0.30  <0.30 ng/mL   Comment:            Due to the release kinetics of  cTnI,     a negative result within the first hours     of the onset of symptoms does not rule out     myocardial infarction with certainty.     If myocardial infarction is still suspected,     repeat the test at appropriate intervals.  TSH     Status: None   Collection Time    01/26/13  9:05 AM      Result Value Range   TSH 1.081  0.350 - 4.500 uIU/mL  CBC     Status: Abnormal   Collection Time    01/26/13  9:05 AM      Result Value Range   WBC 7.0  4.0 - 10.5 K/uL   RBC 3.32 (*) 4.22 - 5.81 MIL/uL   Hemoglobin 9.4 (*) 13.0 - 17.0 g/dL   HCT 34.7 (*) 42.5 - 95.6 %   MCV 88.0  78.0 - 100.0 fL   MCH 28.3  26.0 - 34.0 pg   MCHC 32.2  30.0 - 36.0 g/dL   RDW 38.7 (*) 56.4 - 33.2 %   Platelets 137 (*) 150 - 400 K/uL  CBC     Status: Abnormal   Collection Time    01/26/13  2:34 PM      Result Value Range   WBC 8.1  4.0 - 10.5 K/uL   RBC 3.12 (*) 4.22 - 5.81 MIL/uL   Hemoglobin 8.9 (*) 13.0 - 17.0 g/dL   HCT  27.3 (*) 39.0 - 52.0 %   MCV 87.5  78.0 - 100.0 fL   MCH 28.5  26.0 - 34.0 pg   MCHC 32.6  30.0 - 36.0 g/dL   RDW 09.8 (*) 11.9 - 14.7 %   Platelets 126 (*) 150 - 400 K/uL  PREPARE RBC (CROSSMATCH)     Status: None   Collection Time    01/26/13  5:00 PM      Result Value Range   Order Confirmation ORDER PROCESSED BY BLOOD BANK    MRSA PCR SCREENING     Status: None   Collection Time    01/26/13  9:37 PM      Result Value Range   MRSA by PCR NEGATIVE  NEGATIVE   Comment:            The GeneXpert MRSA Assay (FDA     approved for NASAL specimens     only), is one component of a     comprehensive MRSA colonization     surveillance program. It is not     intended to diagnose MRSA     infection nor to guide or     monitor treatment for     MRSA infections.  PREPARE FRESH FROZEN PLASMA     Status: None   Collection Time    01/26/13 10:00 PM      Result Value Range   Unit Number [id number]     Blood Component Type THAWED PLASMA     Unit division 00     Status of  Unit ISSUED     Transfusion Status OK TO TRANSFUSE     Unit Number [id number]     Blood Component Type THAWED PLASMA     Unit division 00     Status of Unit ISSUED     Transfusion Status OK TO TRANSFUSE    PREPARE RBC (CROSSMATCH)     Status: None   Collection Time    01/26/13 10:00 PM      Result Value Range   Order Confirmation ORDER PROCESSED BY BLOOD BANK    HEMOGLOBIN AND HEMATOCRIT, BLOOD     Status: Abnormal   Collection Time    01/27/13  1:23 AM      Result Value Range   Hemoglobin 10.7 (*) 13.0 - 17.0 g/dL   Comment: POST TRANSFUSION SPECIMEN   HCT 32.3 (*) 39.0 - 52.0 %  CBC WITH DIFFERENTIAL     Status: Abnormal   Collection Time    01/27/13  8:00 AM      Result Value Range   WBC 7.5  4.0 - 10.5 K/uL   RBC 3.69 (*) 4.22 - 5.81 MIL/uL   Hemoglobin 10.6 (*) 13.0 - 17.0 g/dL   HCT 24.4 (*) 01.0 - 27.2 %   MCV 86.2  78.0 - 100.0 fL   MCH 28.7  26.0 - 34.0 pg   MCHC 33.3  30.0 - 36.0 g/dL   RDW 53.6 (*) 64.4 - 03.4 %   Platelets 127 (*) 150 - 400 K/uL   Neutrophils Relative 73  43 - 77 %   Neutro Abs 5.5  1.7 - 7.7 K/uL   Lymphocytes Relative 18  12 - 46 %   Lymphs Abs 1.4  0.7 - 4.0 K/uL   Monocytes Relative 8  3 - 12 %   Monocytes Absolute 0.6  0.1 - 1.0 K/uL   Eosinophils Relative 1  0 - 5 %   Eosinophils Absolute 0.1  0.0 - 0.7 K/uL   Basophils Relative 0  0 - 1 %   Basophils Absolute 0.0  0.0 - 0.1 K/uL  APTT     Status: None   Collection Time    01/27/13  8:00 AM      Result Value Range   aPTT 30  24 - 37 seconds  PROTIME-INR     Status: None   Collection Time    01/27/13  8:00 AM      Result Value Range   Prothrombin Time 13.7  11.6 - 15.2 seconds   INR 1.06  0.00 - 1.49  BASIC METABOLIC PANEL     Status: Abnormal   Collection Time    01/27/13  8:00 AM      Result Value Range   Sodium 143  135 - 145 mEq/L   Potassium 3.4 (*) 3.5 - 5.1 mEq/L   Comment: DELTA CHECK NOTED     REPEATED TO VERIFY   Chloride 107  96 - 112 mEq/L   CO2 30  19 - 32  mEq/L   Glucose, Bld 93  70 - 99 mg/dL   BUN 15  6 - 23 mg/dL   Comment: DELTA CHECK NOTED     REPEATED TO VERIFY   Creatinine, Ser 0.87  0.50 - 1.35 mg/dL   Calcium 8.7  8.4 - 82.9 mg/dL   GFR calc non Af Amer 74 (*) >90 mL/min   GFR calc Af Amer 86 (*) >90 mL/min   Comment:            The eGFR has been calculated     using the CKD EPI equation.     This calculation has not been     validated in all clinical     situations.     eGFR's persistently     <90 mL/min signify     possible Chronic Kidney Disease.    Radiology/Results: No results found.  Anti-infectives: Anti-infectives   None      Assessment/Plan: Problem List: Patient Active Problem List  Diagnosis  . Lower GI bleed  . HTN (hypertension)  . Chronic constipation    Will likely need colonoscopy to rule out colon cancer at some point.  * No surgery found *    LOS: 1 day   Matt B. Daphine Deutscher, MD, [facility] [phone] beeper [phone]  01/27/2013 12:24 PM

## 2013-01-27 NOTE — Progress Notes (Signed)
Subjective: Doing better since transfer here to the unit. No more BM"s. Feels well. No abd pain. Breathing well. no other complaints.    Objective: Vital signs in last 24 hours: Temp:  [94.4 F (34.7 C)-98.9 F (37.2 C)] 97.7 F (36.5 C) (04/14 0512) Pulse Rate:  [61-81] 61 (04/14 0600) Resp:  [13-22] 15 (04/14 0600) BP: (118-186)/(51-88) 166/71 mmHg (04/14 0600) SpO2:  [97 %-100 %] 98 % (04/14 0600) Weight:  [60.5 kg (133 lb 6.1 oz)-60.8 kg (134 lb 0.6 oz)] 60.8 kg (134 lb 0.6 oz) (04/13 2140)  Intake/Output from previous day: 04/13 0701 - 04/14 0700 In: 4131.3 [P.O.:1323; I.V.:1408.3; Blood:1400] Out: 3630 [Urine:3630] Intake/Output this shift:    Lying flat in no distress. Lungs clear, no wheeze, ht regular with murmur. abd soft NT hypoactive BS Awake, a bit confused  Lab Results   Recent Labs  01/26/13 0905 01/26/13 1434 01/27/13 0123  WBC 7.0 8.1  --   RBC 3.32* 3.12*  --   HGB 9.4* 8.9* 10.7*  HCT 29.2* 27.3* 32.3*  MCV 88.0 87.5  --   MCH 28.3 28.5  --   RDW 15.7* 15.8*  --   PLT 137* 126*  --     Recent Labs  01/26/13 0330  NA 143  K 4.3  CL 110  CO2 27  GLUCOSE 113*  BUN 30*  CREATININE 1.07  CALCIUM 8.2*    Studies/Results: No results found.  Scheduled Meds: . amLODipine  5 mg Oral Daily  . antiseptic oral rinse  15 mL Mouth Rinse q12n4p  . [START ON 01/28/2013] chlorhexidine  15 mL Mouth Rinse BID  . irbesartan  300 mg Oral Daily  . losartan  100 mg Oral Daily  . sodium chloride  3 mL Intravenous Q12H  . tamsulosin  0.4 mg Oral Daily   Continuous Infusions: . dextrose 5 % and 0.9% NaCl Stopped (01/26/13 1825)   PRN Meds:ALPRAZolam, ondansetron (ZOFRAN) IV, ondansetron, traMADol  Assessment/Plan:  Lower GI bleed: Doing better. Now at 10.7 with no more bleeding. Suspect diverticular source.  HTN (hypertension): BP is up some, increase Rx  Dementia: seems fairly mild on brief exam. Paranoia noted in our records  CODE STATUS: living  will noted in our records. FULL CODE per discussion with Dr. Conley Rolls    LOS: 1 day   Julian Hy 01/27/2013, 8:13 AM

## 2013-01-27 NOTE — Progress Notes (Signed)
INITIAL NUTRITION ASSESSMENT  DOCUMENTATION CODES Per approved criteria  -Not Applicable   INTERVENTION: Provide Resource Breeze BID until diet advanced Provide Ensure Complete BID when diet advances Provide Multivitamin with minerals daily Diet advancement per MD discretion   NUTRITION DIAGNOSIS: Unintentional wt loss related to medical condition as evidenced by 11% wt loss in less than 5 months.  Goal: Pt to meet >/= 90% of their estimated nutrition needs  Monitor:  PO intake Wt Labs  Reason for Assessment: MST  79 y.o. male  Admitting Dx: Lower GI bleed  ASSESSMENT: 79 y.o. male with hx of HTN, anxiety, chronic constipation, presents to the ER with several episodes of BRBPR Pt reports that his appetite is good/normal. Per pt, he usually weighs 155 lbs and does not know why he has lost weight. Pt reports that he usually eats 2 meals daily at home and denies changes in appetite. Pt reports that he likes milk and ice cream. Encouraged pt to monitor weight and increase food intake as tolerated to prevent further wt loss. Pt couldn't remember if he had any breakfast this morning but, requested coffee and lunch at time of visit stating he was hungry.  Height: Ht Readings from Last 1 Encounters:  01/26/13 5\' 11"  (1.803 m)    Weight: Wt Readings from Last 1 Encounters:  01/26/13 134 lb 0.6 oz (60.8 kg)    Ideal Body Weight: 172 lbs  % Ideal Body Weight: 78%  Wt Readings from Last 10 Encounters:  01/26/13 134 lb 0.6 oz (60.8 kg)  09/19/12 150 lb (68.04 kg)    Usual Body Weight: 155 lbs  % Usual Body Weight: 86%  BMI:  Body mass index is 18.7 kg/(m^2).  Estimated Nutritional Needs: Kcal: 1610-9604 Protein: 73-85 grams Fluid: 1.8 -2.1 L  Skin: Stage 1 pressure ulcer on sacrum; ecchymosis on arms  Diet Order: Clear Liquid  EDUCATION NEEDS: -No education needs identified at this time   Intake/Output Summary (Last 24 hours) at 01/27/13 1314 Last data  filed at 01/27/13 1300  Gross per 24 hour  Intake 3921.33 ml  Output   5630 ml  Net -1708.67 ml    Last BM: 4/13  Labs:   Recent Labs Lab 01/26/13 0330 01/27/13 0800  NA 143 143  K 4.3 3.4*  CL 110 107  CO2 27 30  BUN 30* 15  CREATININE 1.07 0.87  CALCIUM 8.2* 8.7  GLUCOSE 113* 93    CBG (last 3)  No results found for this basename: GLUCAP,  in the last 72 hours  Scheduled Meds: . amLODipine  10 mg Oral Daily  . antiseptic oral rinse  15 mL Mouth Rinse q12n4p  . [START ON 01/28/2013] chlorhexidine  15 mL Mouth Rinse BID  . losartan  100 mg Oral Daily  . sodium chloride  3 mL Intravenous Q12H  . tamsulosin  0.4 mg Oral Daily    Continuous Infusions: . dextrose 5 % and 0.9% NaCl Stopped (01/26/13 1825)    Past Medical History  Diagnosis Date  . Hypertension   . Anxiety   . BPH (benign prostatic hypertrophy)   . Constipation, chronic     Past Surgical History  Procedure Laterality Date  . Prostate surgery    . Tonsilectomy/adenoidectomy with myringotomy      Ian Malkin RD, LDN Inpatient Clinical Dietitian Pager: [phone] After Hours Pager: [phone]

## 2013-01-27 NOTE — Progress Notes (Signed)
Patient seen, examined, and I agree with the above documentation, including the assessment and plan. Pt without further BM today and therefore bleeding has slowed, and possibly stopped.  Presumed diverticular in origin given painless nature and presentation Hgb responded to transfusion. For active rebleeding, would consider tagged scan.   Deferring colonoscopy for now.  Could be considered if bleeding stutters unabated

## 2013-01-27 NOTE — Progress Notes (Signed)
[facility] Progress Note  Subjective:  No further bleeding since yesterday afternoon when he was transferred to the ICU.  Colonoscopy would have been several years ago, if at all.  No complaints.  Objective:  Vital signs in last 24 hours: Temp:  [94.4 F (34.7 C)-98.9 F (37.2 C)] 97.7 F (36.5 C) (04/14 0512) Pulse Rate:  [61-81] 61 (04/14 0600) Resp:  [13-22] 15 (04/14 0600) BP: (118-186)/(51-88) 166/71 mmHg (04/14 0600) SpO2:  [97 %-100 %] 98 % (04/14 0600) Weight:  [133 lb 6.1 oz (60.5 kg)-134 lb 0.6 oz (60.8 kg)] 134 lb 0.6 oz (60.8 kg) (04/13 2140) Last BM Date: 01/26/13 General:   Alert, Well-developed, in NAD; HOH Heart:  Regular rate and rhythm; no murmurs Pulm:  CTAB.  No W/R/R. Abdomen:  Soft, nontender and nondistended. Normal bowel sounds, without guarding, and without rebound.   Extremities:  Without edema. Neurologic:  Alert and  oriented x4;  grossly normal neurologically. Psych:  Alert and cooperative. Normal mood and affect.  Intake/Output from previous day: 04/13 0701 - 04/14 0700 In: 4131.3 [P.O.:1323; I.V.:1408.3; Blood:1400] Out: 3630 [Urine:3630]  Lab Results:  Recent Labs  01/26/13 0330 01/26/13 0905 01/26/13 1434 01/27/13 0123  WBC 7.6 7.0 8.1  --   HGB 8.9* 9.4* 8.9* 10.7*  HCT 27.9* 29.2* 27.3* 32.3*  PLT 136* 137* 126*  --    BMET  Recent Labs  01/26/13 0330  NA 143  K 4.3  CL 110  CO2 27  GLUCOSE 113*  BUN 30*  CREATININE 1.07  CALCIUM 8.2*   LFT  Recent Labs  01/26/13 0330  PROT 4.9*  ALBUMIN 2.9*  AST 13  ALT 8  ALKPHOS 33*  BILITOT 0.2*   Assessment / Plan: -LGIB:  Likely diverticular.  Seems to be resolving.   -ABLA:  S/P 3 units of PRBC's.  Hgb responded appropriately.  *Monitor Hgb. *Will likely defer colonoscopy for now and just continue to monitor.  Bleeding scan should be ordered if he were to re-bleed acutely.    LOS: 1 day   Gabrielle Mester D.  01/27/2013, 8:07 AM  Pager number  [phone]

## 2013-01-28 ENCOUNTER — Encounter (HOSPITAL_COMMUNITY): Payer: Self-pay

## 2013-01-28 ENCOUNTER — Encounter (HOSPITAL_COMMUNITY)
Admission: EM | Disposition: A | Discharge status: Home Health | Payer: Self-pay | Source: Home / Self Care | Attending: Endocrinology

## 2013-01-28 DIAGNOSIS — K624 Stenosis of anus and rectum: Secondary | ICD-10-CM

## 2013-01-28 DIAGNOSIS — K5731 Diverticulosis of large intestine without perforation or abscess with bleeding: Secondary | ICD-10-CM

## 2013-01-28 HISTORY — PX: COLONOSCOPY: SHX5424

## 2013-01-28 LAB — PREPARE FRESH FROZEN PLASMA: Unit division: 0

## 2013-01-28 LAB — BASIC METABOLIC PANEL
Calcium: 8.8 mg/dL (ref 8.4–10.5)
Chloride: 109 mEq/L (ref 96–112)
Creatinine, Ser: 0.87 mg/dL (ref 0.50–1.35)
GFR calc Af Amer: 86 mL/min — ABNORMAL LOW (ref >90)
Sodium: 145 mEq/L (ref 135–145)

## 2013-01-28 LAB — CBC
MCH: 28.5 pg (ref 26.0–34.0)
MCV: 87.4 fL (ref 78.0–100.0)
Platelets: 150 10*3/uL (ref 150–400)
Platelets: 140 10*3/uL — ABNORMAL LOW (ref 150–400)
RDW: 15.5 % (ref 11.5–15.5)
RDW: 15.3 % (ref 11.5–15.5)
WBC: 8.3 10*3/uL (ref 4.0–10.5)
WBC: 9.9 10*3/uL (ref 4.0–10.5)

## 2013-01-28 SURGERY — COLONOSCOPY
Anesthesia: Moderate Sedation

## 2013-01-28 MED ORDER — FENTANYL CITRATE 0.05 MG/ML IJ SOLN
INTRAMUSCULAR | Status: DC | PRN
  Administered 2013-01-28 (×2): 25 ug via INTRAVENOUS

## 2013-01-28 MED ORDER — TRAMADOL HCL 50 MG PO TABS
50.0000 mg | ORAL_TABLET | Freq: Once | ORAL | Status: AC
  Administered 2013-01-28: 50 mg via ORAL

## 2013-01-28 MED ORDER — MIDAZOLAM HCL 5 MG/5ML IJ SOLN
INTRAMUSCULAR | Status: DC | PRN
  Administered 2013-01-28: 1 mg via INTRAVENOUS
  Administered 2013-01-28 (×2): 2 mg via INTRAVENOUS
  Administered 2013-01-28: 1 mg via INTRAVENOUS

## 2013-01-28 MED ORDER — MIDAZOLAM HCL 10 MG/2ML IJ SOLN
INTRAMUSCULAR | Status: AC
  Filled 2013-01-28: qty 2

## 2013-01-28 MED ORDER — DIPHENHYDRAMINE HCL 50 MG/ML IJ SOLN
INTRAMUSCULAR | Status: AC
  Filled 2013-01-28: qty 1

## 2013-01-28 MED ORDER — MIDAZOLAM HCL 10 MG/2ML IJ SOLN
INTRAMUSCULAR | Status: AC
  Filled 2013-01-28: qty 4

## 2013-01-28 MED ORDER — FENTANYL CITRATE 0.05 MG/ML IJ SOLN
INTRAMUSCULAR | Status: AC
  Filled 2013-01-28: qty 4

## 2013-01-28 MED ORDER — FENTANYL CITRATE 0.05 MG/ML IJ SOLN
INTRAMUSCULAR | Status: AC
Start: 2013-01-28 — End: 2013-01-28
  Filled 2013-01-28: qty 2

## 2013-01-28 NOTE — Progress Notes (Signed)
Patient ID: [name], male   DOB: 1921/01/12, 79 y.o.   MRN: [id number] [facility] Surgery Progress Note:   * No surgery date entered *  Subjective: Mental status is clear.  Heading down for colonoscopy Objective: Vital signs in last 24 hours: Temp:  [97.4 F (36.3 C)-98.8 F (37.1 C)] 97.4 F (36.3 C) (04/15 0800) Pulse Rate:  [63-87] 63 (04/15 0400) Resp:  [15-26] 16 (04/15 0400) BP: (116-162)/(55-72) 116/60 mmHg (04/15 0400) SpO2:  [97 %-100 %] 98 % (04/15 0400)  Intake/Output from previous day: 04/14 0701 - 04/15 0700 In: 3310 [P.O.:2160; I.V.:1150] Out: 4500 [Urine:4500] Intake/Output this shift:    Physical Exam: Work of breathing is unchanged.  No abdominal pain.  Did has red blood per rectum with colonscopy prep.    Lab Results:  Results for orders placed during the hospital encounter of 01/26/13 (from the past 48 hour(s))  TROPONIN I     Status: None   Collection Time    01/26/13  9:05 AM      Result Value Range   Troponin I <0.30  <0.30 ng/mL   Comment:            Due to the release kinetics of cTnI,     a negative result within the first hours     of the onset of symptoms does not rule out     myocardial infarction with certainty.     If myocardial infarction is still suspected,     repeat the test at appropriate intervals.  TSH     Status: None   Collection Time    01/26/13  9:05 AM      Result Value Range   TSH 1.081  0.350 - 4.500 uIU/mL  CBC     Status: Abnormal   Collection Time    01/26/13  9:05 AM      Result Value Range   WBC 7.0  4.0 - 10.5 K/uL   RBC 3.32 (*) 4.22 - 5.81 MIL/uL   Hemoglobin 9.4 (*) 13.0 - 17.0 g/dL   HCT 56.2 (*) 13.0 - 86.5 %   MCV 88.0  78.0 - 100.0 fL   MCH 28.3  26.0 - 34.0 pg   MCHC 32.2  30.0 - 36.0 g/dL   RDW 78.4 (*) 69.6 - 29.5 %   Platelets 137 (*) 150 - 400 K/uL  CBC     Status: Abnormal   Collection Time    01/26/13  2:34 PM      Result Value Range   WBC 8.1  4.0 - 10.5 K/uL   RBC 3.12 (*) 4.22 -  5.81 MIL/uL   Hemoglobin 8.9 (*) 13.0 - 17.0 g/dL   HCT 28.4 (*) 13.2 - 44.0 %   MCV 87.5  78.0 - 100.0 fL   MCH 28.5  26.0 - 34.0 pg   MCHC 32.6  30.0 - 36.0 g/dL   RDW 10.2 (*) 72.5 - 36.6 %   Platelets 126 (*) 150 - 400 K/uL  PREPARE RBC (CROSSMATCH)     Status: None   Collection Time    01/26/13  5:00 PM      Result Value Range   Order Confirmation ORDER PROCESSED BY BLOOD BANK    MRSA PCR SCREENING     Status: None   Collection Time    01/26/13  9:37 PM      Result Value Range   MRSA by PCR NEGATIVE  NEGATIVE   Comment:  The GeneXpert MRSA Assay (FDA     approved for NASAL specimens     only), is one component of a     comprehensive MRSA colonization     surveillance program. It is not     intended to diagnose MRSA     infection nor to guide or     monitor treatment for     MRSA infections.  PREPARE FRESH FROZEN PLASMA     Status: None   Collection Time    01/26/13 10:00 PM      Result Value Range   Unit Number [id number]     Blood Component Type THAWED PLASMA     Unit division 00     Status of Unit ISSUED,FINAL     Transfusion Status OK TO TRANSFUSE     Unit Number [id number]     Blood Component Type THAWED PLASMA     Unit division 00     Status of Unit ISSUED,FINAL     Transfusion Status OK TO TRANSFUSE    PREPARE RBC (CROSSMATCH)     Status: None   Collection Time    01/26/13 10:00 PM      Result Value Range   Order Confirmation ORDER PROCESSED BY BLOOD BANK    HEMOGLOBIN AND HEMATOCRIT, BLOOD     Status: Abnormal   Collection Time    01/27/13  1:23 AM      Result Value Range   Hemoglobin 10.7 (*) 13.0 - 17.0 g/dL   Comment: POST TRANSFUSION SPECIMEN   HCT 32.3 (*) 39.0 - 52.0 %  CBC WITH DIFFERENTIAL     Status: Abnormal   Collection Time    01/27/13  8:00 AM      Result Value Range   WBC 7.5  4.0 - 10.5 K/uL   RBC 3.69 (*) 4.22 - 5.81 MIL/uL   Hemoglobin 10.6 (*) 13.0 - 17.0 g/dL   HCT 30.8 (*) 65.7 - 84.6 %   MCV 86.2  78.0 -  100.0 fL   MCH 28.7  26.0 - 34.0 pg   MCHC 33.3  30.0 - 36.0 g/dL   RDW 96.2 (*) 95.2 - 84.1 %   Platelets 127 (*) 150 - 400 K/uL   Neutrophils Relative 73  43 - 77 %   Neutro Abs 5.5  1.7 - 7.7 K/uL   Lymphocytes Relative 18  12 - 46 %   Lymphs Abs 1.4  0.7 - 4.0 K/uL   Monocytes Relative 8  3 - 12 %   Monocytes Absolute 0.6  0.1 - 1.0 K/uL   Eosinophils Relative 1  0 - 5 %   Eosinophils Absolute 0.1  0.0 - 0.7 K/uL   Basophils Relative 0  0 - 1 %   Basophils Absolute 0.0  0.0 - 0.1 K/uL  APTT     Status: None   Collection Time    01/27/13  8:00 AM      Result Value Range   aPTT 30  24 - 37 seconds  PROTIME-INR     Status: None   Collection Time    01/27/13  8:00 AM      Result Value Range   Prothrombin Time 13.7  11.6 - 15.2 seconds   INR 1.06  0.00 - 1.49  BASIC METABOLIC PANEL     Status: Abnormal   Collection Time    01/27/13  8:00 AM      Result Value Range   Sodium 143  135 - 145  mEq/L   Potassium 3.4 (*) 3.5 - 5.1 mEq/L   Comment: DELTA CHECK NOTED     REPEATED TO VERIFY   Chloride 107  96 - 112 mEq/L   CO2 30  19 - 32 mEq/L   Glucose, Bld 93  70 - 99 mg/dL   BUN 15  6 - 23 mg/dL   Comment: DELTA CHECK NOTED     REPEATED TO VERIFY   Creatinine, Ser 0.87  0.50 - 1.35 mg/dL   Calcium 8.7  8.4 - 24.4 mg/dL   GFR calc non Af Amer 74 (*) >90 mL/min   GFR calc Af Amer 86 (*) >90 mL/min   Comment:            The eGFR has been calculated     using the CKD EPI equation.     This calculation has not been     validated in all clinical     situations.     eGFR's persistently     <90 mL/min signify     possible Chronic Kidney Disease.    Radiology/Results: No results found.  Anti-infectives: Anti-infectives   None      Assessment/Plan: Problem List: Patient Active Problem List  Diagnosis  . Lower GI bleed  . HTN (hypertension)  . Chronic constipation    Await colonscopy.  Bleeding pattern may refect colon neoplasm.   * No surgery date entered *     LOS: 2 days   Matt B. Daphine Deutscher, MD, [facility] [phone] beeper [phone]  01/28/2013 8:42 AM

## 2013-01-28 NOTE — Progress Notes (Signed)
Subjective: 6 bloody BM"s over the night. Last was still red Had bowel prep overnight No abd pain   Objective: Vital signs in last 24 hours: Temp:  [98.6 F (37 C)-98.8 F (37.1 C)] 98.7 F (37.1 C) (04/14 1600) Pulse Rate:  [63-87] 63 (04/15 0400) Resp:  [15-26] 16 (04/15 0400) BP: (116-162)/(55-72) 116/60 mmHg (04/15 0400) SpO2:  [97 %-100 %] 98 % (04/15 0400)  Intake/Output from previous day: 04/14 0701 - 04/15 0700 In: 3310 [P.O.:2160; I.V.:1150] Out: 4500 [Urine:4500] Intake/Output this shift:    General: fatigued lying flat lungs clear. Ht IR IR abd nondistended. Good BS's alert awake speech clear  Lab Results   Recent Labs  01/26/13 1434 01/27/13 0123 01/27/13 0800  WBC 8.1  --  7.5  RBC 3.12*  --  3.69*  HGB 8.9* 10.7* 10.6*  HCT 27.3* 32.3* 31.8*  MCV 87.5  --  86.2  MCH 28.5  --  28.7  RDW 15.8*  --  15.8*  PLT 126*  --  127*    Recent Labs  01/26/13 0330 01/27/13 0800  NA 143 143  K 4.3 3.4*  CL 110 107  CO2 27 30  GLUCOSE 113* 93  BUN 30* 15  CREATININE 1.07 0.87  CALCIUM 8.2* 8.7    Studies/Results: No results found.  Scheduled Meds: . amLODipine  10 mg Oral Daily  . antiseptic oral rinse  15 mL Mouth Rinse q12n4p  . chlorhexidine  15 mL Mouth Rinse BID  . feeding supplement  1 Container Oral BID BM  . losartan  100 mg Oral Daily  . multivitamin with minerals  1 tablet Oral Daily  . peg 3350 powder  0.5 kit Oral Once  . sodium chloride  3 mL Intravenous Q12H  . tamsulosin  0.4 mg Oral Daily   Continuous Infusions: . dextrose 5 % and 0.9% NaCl 50 mL/hr at 01/28/13 0722   PRN Meds:ALPRAZolam, ondansetron (ZOFRAN) IV, ondansetron, traMADol  Assessment/Plan:  Lower GI bleed: some bleeding overnight HTN (hypertension):  BP better Dementia: seems fairly mild on brief exam. Paranoia noted in our records  CODE STATUS: living will noted in our records. FULL CODE per discussion with Dr. Conley Rolls     LOS: 2 days   Julian Hy 01/28/2013, 7:50 AM

## 2013-01-28 NOTE — Progress Notes (Signed)
Physical Therapy Treatment Patient Details Name: [name] MRN: [id number] DOB: 1921/06/02 Today's Date: 01/28/2013 Time: 4098-1191 PT Time Calculation (min): 27 min  PT Assessment / Plan / Recommendation Comments on Treatment Session  Pt. reports he has had too much going on today for walking . Has had procedure this AM but was fairly mobile getting to the [facility]. Pt. reports plan to go back to [place].    Follow Up Recommendations  Home health PT     Does the patient have the potential to tolerate intense rehabilitation     Barriers to Discharge        Equipment Recommendations  None recommended by PT    Recommendations for Other Services    Frequency Min 3X/week   Plan Frequency remains appropriate    Precautions / Restrictions Precautions Precautions: Fall Restrictions Weight Bearing Restrictions: No   Pertinent Vitals/Pain     Mobility  Bed Mobility Bed Mobility: Supine to Sit;Sit to Supine Supine to Sit: 4: Min guard;HOB flat;With rails Sit to Supine: 4: Min guard Details for Bed Mobility Assistance: Min/guard for safety of trunk getting to EOB.  Min cues for hand placement and technique.  pt is slightly impulsive. Transfers Sit to Stand: 4: Min guard;With upper extremity assist;From bed Stand to Sit: 4: Min guard;With upper extremity assist;With armrests;To chair/3-in-1 Details for Transfer Assistance: cues for using walker Ambulation/Gait Ambulation/Gait Assistance: Not tested (comment) Ambulation/Gait Assistance Details: Pt. declined to walk today due to having procedure done today.    Exercises     PT Diagnosis:    PT Problem List:   PT Treatment Interventions:     PT Goals Acute Rehab PT Goals Pt will go Supine/Side to Sit: with supervision PT Goal: Supine/Side to Sit - Progress: Progressing toward goal Pt will go Sit to Supine/Side: with supervision PT Goal: Sit to Supine/Side - Progress: Progressing toward goal Pt will go Sit to Stand: with  supervision PT Goal: Sit to Stand - Progress: Progressing toward goal Pt will go Stand to Sit: with supervision PT Goal: Stand to Sit - Progress: Progressing toward goal  Visit Information  Last PT Received On: 01/28/13 Assistance Needed: +1    Subjective Data  Subjective: I need my nurse. I had a lot of laxatives and I need to [facility].   Cognition  Cognition Overall Cognitive Status: Appears within functional limits for tasks assessed/performed Arousal/Alertness: Awake/alert Behavior During Session: [facility] for tasks performed    Balance  Balance Balance Assessed: Yes Static Standing Balance Static Standing - Balance Support: Bilateral upper extremity supported Static Standing - Level of Assistance: 5: Stand by assistance Static Standing - Comment/# of Minutes: standing for getting cleaned up from BM  End of Session PT - End of Session Activity Tolerance: Patient tolerated treatment well;Patient limited by fatigue Patient left: with call bell/phone within reach;in bed Nurse Communication: Mobility status   GP     Rada Hay 01/28/2013, 3:31 PM

## 2013-01-28 NOTE — Progress Notes (Addendum)
Subjective: Pt has remained stable over the day Discussed procedure results and findings and the overall case with Dr Rhea Belton from GI Vitals reviewed remotely.    Objective: Vital signs in last 24 hours: Temp:  [97.1 F (36.2 C)-97.8 F (36.6 C)] 97.1 F (36.2 C) (04/15 1200) Pulse Rate:  [57-87] 79 (04/15 1600) Resp:  [13-65] 19 (04/15 1600) BP: (116-194)/(52-114) 121/52 mmHg (04/15 1600) SpO2:  [97 %-100 %] 99 % (04/15 1600)  Intake/Output from previous day: 04/14 0701 - 04/15 0700 In: 3310 [P.O.:2160; I.V.:1150] Out: 4500 [Urine:4500] Intake/Output this shift: Total I/O In: 431.7 [I.V.:431.7] Out: -     Lab Results   Recent Labs  01/27/13 0800 01/28/13 0834  WBC 7.5 8.3  RBC 3.69* 3.82*  HGB 10.6* 10.9*  HCT 31.8* 33.4*  MCV 86.2 87.4  MCH 28.7 28.5  RDW 15.8* 15.5  PLT 127* 150    Recent Labs  01/27/13 0800 01/28/13 0834  NA 143 145  K 3.4* 3.5  CL 107 109  CO2 30 31  GLUCOSE 93 100*  BUN 15 13  CREATININE 0.87 0.87  CALCIUM 8.7 8.8    Studies/Results: No results found.  Scheduled Meds: . amLODipine  10 mg Oral Daily  . antiseptic oral rinse  15 mL Mouth Rinse q12n4p  . chlorhexidine  15 mL Mouth Rinse BID  . feeding supplement  1 Container Oral BID BM  . losartan  100 mg Oral Daily  . multivitamin with minerals  1 tablet Oral Daily  . peg 3350 powder  0.5 kit Oral Once  . sodium chloride  3 mL Intravenous Q12H  . tamsulosin  0.4 mg Oral Daily   Continuous Infusions: . dextrose 5 % and 0.9% NaCl 50 mL/hr at 01/28/13 0722   PRN Meds:ALPRAZolam, ondansetron (ZOFRAN) IV, ondansetron, traMADol  Assessment/Plan: LOWER GI BLEED: No active bleeding on scope. Hemodynamically stable.HGG better this AM Will send to tele.Will wait until tomorrow to advance diet Check another CBC this PM   LOS: 2 days   [name] [name] 01/28/2013, 5:19 PM

## 2013-01-28 NOTE — Interval H&P Note (Signed)
History and Physical Interval Note: After discussion with Dr. Daphine Deutscher, patient, and daughter ([name]), we decided to pursue colonoscopy to exclude another source for recent hematochezia.  Working diagnosis remains diverticular hemorrhage, but this procedure should help exclude another source. The nature of the procedure, as well as the risks, benefits, and alternatives were carefully and thoroughly reviewed with the patient. Ample time for discussion and questions allowed. The patient understood, was satisfied, and agreed to proceed.     01/28/2013 9:56 AM  [name]  has presented today for surgery, with the diagnosis of LGIB  The various methods of treatment have been discussed with the patient and family. After consideration of risks, benefits and other options for treatment, the patient has consented to  Procedure(s): COLONOSCOPY (N/A) as a surgical intervention .  The patient's history has been reviewed, patient examined, no change in status, stable for surgery.  I have reviewed the patient's chart and labs.  Questions were answered to the patient's satisfaction.     [name]

## 2013-01-28 NOTE — Op Note (Addendum)
[facility] [address] [place], [address]   COLONOSCOPY PROCEDURE REPORT  PATIENT: [name], Edward  MR#: [id number] BIRTHDATE: 02-04-1921 , 89  yrs. old GENDER: Male ENDOSCOPIST: Beverley Fiedler, MD REFERRED XB:JYNWGN, Matthew PROCEDURE DATE:  01/28/2013 PROCEDURE:   Colonoscopy, diagnostic ASA CLASS:   Class III INDICATIONS:hematochezia MEDICATIONS: These medications were titrated to patient response per physician's verbal order, Fentanyl 50 mcg IV, and Versed 6 mg IV  DESCRIPTION OF PROCEDURE:   After the risks benefits and alternatives of the procedure were thoroughly explained, informed consent was obtained.  A digital rectal exam revealed stenosis of the anal canal.   The Colonoscope [id number]  endoscope was introduced through the anus and advanced to the cecum, which was identified by both the appendix and ileocecal valve. No adverse events experienced.   The quality of the prep was good, using MoviPrep The instrument was then slowly withdrawn as the colon was fully examined.   COLON FINDINGS: There was moderate to severe (most severe in sigmoid) diverticulosis noted in the ascending colon, descending colon, and sigmoid colon with associated angulation.  No bleeding was noted from the diverticulosis despite irrigation and lavage. Dilute old blood was noted mostly in the left colon.  No evidence for active bleeding.   A sessile polyp measuring 5 mm in size was found in the transverse colon.   Polypectomy was not attempted given recent active bleeding.  The colonic mucosa appeared otherwise normal throughout the entire examined colon.   Anal stenosis, mild.  The pediatric colonoscopy was able to be introduced without resistance.  At the end of the case digital dilation was performed.  Retroflexed views revealed no abnormalities.        The scope was withdrawn and the procedure completed.  COMPLICATIONS: There were no complications.    ENDOSCOPIC  IMPRESSION: 1.   There was moderate diverticulosis noted in the ascending colon, descending colon, and sigmoid colon; not active bleeding 2.   Sessile polyp measuring 5 mm in size was found in the transverse colon; polypectomy not attempted due to recent bleeding 3.   The colonic mucosa appeared normal throughout the entire examined colon 4.   Mild anal canal stenosis  RECOMMENDATIONS: 1.  Continue observation.  If rebleeding would consider tagged RBC study + angiography for embolization if positive. 2.  Monitor Hgb/HCT, transfuse if necessary 3.  Keep stools soft given stenosis of anal canal.  Dilation with anesthesia could be performed if clinically symptomatic.   eSigned:  Beverley Fiedler, MD 01/28/2013 11:21 AM Revised: 01/28/2013 11:21 AM  cc: The Patient   PATIENT NAME:  Edward, [name] MR#: [id number]

## 2013-01-28 NOTE — Progress Notes (Addendum)
Occupational Therapy Treatment Patient Details Name: [name] MRN: [id number] DOB: 1921-09-21 Today's Date: 01/28/2013 Time: 4098-1191 OT Time Calculation (min): 8 min  OT Assessment / Plan / Recommendation Comments on Treatment Session      Follow Up Recommendations  Supervision/Assistance - 24 hour;Home health OT    Barriers to Discharge       Equipment Recommendations  None recommended by OT    Recommendations for Other Services    Frequency Min 3X/week   Plan      Precautions / Restrictions Restrictions Weight Bearing Restrictions: No   Pertinent Vitals/Pain No c/o pain.  VSS    ADL  Toilet Transfer: Min Pension scheme manager Method: Stand Wellsite geologist: Bedside commode Transfers/Ambulation Related to ADLs: spt with RW--min cues for safety.   ADL Comments: Pt had procedure (colonoscopy) this am.  Assisted with toilet hygiene today--in case there was blood after procedure.    Performed partial bath on commode.  Pt anxious to get back to bed--did stand one more time to change dressing on buttocks as it was soiled.  Pt very HOH.  States the PCA did home management things.  He was independent with ADLs.   OT Diagnosis:    OT Problem List:   OT Treatment Interventions:     OT Goals ADL Goals Pt Will Transfer to Toilet: with supervision;Ambulation;with DME ADL Goal: Toilet Transfer - Progress: Progressing toward goals  Visit Information  Last OT Received On: 01/28/13 Assistance Needed: +1 PT/OT Co-Evaluation/Treatment: Yes    Subjective Data      Prior Functioning       Cognition  Cognition Overall Cognitive Status: Appears within functional limits for tasks assessed/performed Arousal/Alertness: Awake/alert Behavior During Session: [facility] for tasks performed    Mobility  Transfers Sit to Stand: 4: Min guard;With upper extremity assist;From bed Stand to Sit: 4: Min guard;With upper extremity assist;With armrests;To chair/3-in-1 Details  for Transfer Assistance: cues for using walker    Exercises      Balance     End of Session OT - End of Session Activity Tolerance: Patient limited by fatigue Patient left: in bed;with call bell/phone within reach  GO     Zaakirah Kistner 01/28/2013, 1:59 PM Marica Otter, OTR/L [phone] 01/28/2013

## 2013-01-29 ENCOUNTER — Encounter (HOSPITAL_COMMUNITY): Payer: Self-pay | Admitting: Internal Medicine

## 2013-01-29 DIAGNOSIS — K5900 Constipation, unspecified: Secondary | ICD-10-CM

## 2013-01-29 DIAGNOSIS — K922 Gastrointestinal hemorrhage, unspecified: Secondary | ICD-10-CM

## 2013-01-29 LAB — COMPREHENSIVE METABOLIC PANEL
Alkaline Phosphatase: 39 U/L (ref 39–117)
BUN: 12 mg/dL (ref 6–23)
Chloride: 109 mEq/L (ref 96–112)
Creatinine, Ser: 0.84 mg/dL (ref 0.50–1.35)
GFR calc Af Amer: 87 mL/min — ABNORMAL LOW (ref >90)
Glucose, Bld: 91 mg/dL (ref 70–99)
Potassium: 3.4 mEq/L — ABNORMAL LOW (ref 3.5–5.1)
Total Bilirubin: 0.4 mg/dL (ref 0.3–1.2)
Total Protein: 4.6 g/dL — ABNORMAL LOW (ref 6.0–8.3)

## 2013-01-29 LAB — CBC
HCT: 27.8 % — ABNORMAL LOW (ref 39.0–52.0)
Hemoglobin: 9 g/dL — ABNORMAL LOW (ref 13.0–17.0)
Hemoglobin: 9.8 g/dL — ABNORMAL LOW (ref 13.0–17.0)
MCHC: 32.4 g/dL (ref 30.0–36.0)
MCV: 87.4 fL (ref 78.0–100.0)
RBC: 3.46 MIL/uL — ABNORMAL LOW (ref 4.22–5.81)

## 2013-01-29 MED ORDER — POLYETHYLENE GLYCOL 3350 17 G PO PACK
17.0000 g | PACK | Freq: Every day | ORAL | Status: DC | PRN
  Filled 2013-01-29: qty 1

## 2013-01-29 MED ORDER — DOCUSATE SODIUM 100 MG PO CAPS
100.0000 mg | ORAL_CAPSULE | Freq: Every day | ORAL | Status: DC
  Administered 2013-01-29 – 2013-01-30 (×2): 100 mg via ORAL
  Filled 2013-01-29 (×2): qty 1

## 2013-01-29 MED ORDER — HYDROCORTISONE 2.5 % RE CREA
TOPICAL_CREAM | Freq: Two times a day (BID) | RECTAL | Status: DC | PRN
Start: 2013-01-29 — End: 2013-01-30
  Administered 2013-01-29: 10:00:00 via RECTAL
  Filled 2013-01-29: qty 28.35

## 2013-01-29 MED ORDER — AMLODIPINE BESYLATE 5 MG PO TABS
5.0000 mg | ORAL_TABLET | Freq: Every day | ORAL | Status: DC
  Filled 2013-01-29: qty 1

## 2013-01-29 MED ORDER — SENNA 8.6 MG PO TABS
1.0000 | ORAL_TABLET | Freq: Every day | ORAL | Status: DC
  Administered 2013-01-29: 8.6 mg via ORAL
  Filled 2013-01-29: qty 1

## 2013-01-29 MED ORDER — ENSURE COMPLETE PO LIQD
237.0000 mL | Freq: Two times a day (BID) | ORAL | Status: DC
  Administered 2013-01-29 – 2013-01-30 (×3): 237 mL via ORAL

## 2013-01-29 NOTE — Clinical Documentation Improvement (Signed)
Anemia Blood Loss Clarification  THIS DOCUMENT IS NOT A PERMANENT PART OF THE MEDICAL RECORD  RESPOND TO THE THIS QUERY, FOLLOW THE INSTRUCTIONS BELOW:  1. If needed, update documentation for the patient's encounter via the notes activity.  2. Access this query again and click edit on the In [employer].  3. After updating, or not, click F2 to complete all highlighted (required) fields concerning your review. Select "additional documentation in the medical record" OR "no additional documentation provided".  4. Click Sign note button.  5. The deficiency will fall out of your In Basket *Please let us know if you are not able to complete this workflow by phone or e-mail (listed below).        01/29/13  Dear Dr. [facility]  In an effort to better capture your patient's severity of illness, reflect appropriate length of stay and utilization of resources, a review of the patient medical record has revealed the following indicators.    Based on your clinical judgment, please clarify and document in a progress note and/or discharge summary the clinical condition associated with the following supporting information:  In responding to this query please exercise your independent judgment.  The fact that a query is asked, does not imply that any particular answer is desired or expected.  01/29/13 Per progr notes "Anemia" and abnormal labs values noted. For accurate Dx specificity & severity can noted "Anemia" & abn labs values be further specified w/ clinical cond being eval'd, mon'd & tx'd. Thank you  Possible Clinical Conditions?  " Expected Acute Blood Loss Anemia " Acute Blood Loss Anemia " Acute on chronic blood loss anemia " Chronic blood loss anemia " Precipitous drop in Hematocrit  " Other Condition________________ " Cannot Clinically Determine  Supporting Information: Risk Factors:  See Note Below:  Signs and Symptoms: 01/29/13 Progr Note per Surg PA-C.Marland KitchenMarland Kitchen"Lower GI  Bleed: Diverticulosis noted on CSP but no active bleed noted, Hgb dropped 2 gm since yesterday .1. IVF, pain control, liquid diet 2. No source identified, if Hgb continues to fall will need Tagged RBC scan & angiography for embolization if positive"  Diagnostics: 01/26/13 on adm; HGB  8.9 (*)    HCT  27.9 (*)  01/28/13  1744    01/29/13   0458  HGB       10.4*                  9.0*  HCT        31.2*                27.8*  Treatments: Transfusion: 01/26/13 Prbc's transfused, cont'd IV fluids,  Serial H&H monitoring ordered & GI consult w/ colonscopy noted.  Medications:  Reviewed: additional documentation in the medical record  Thank You,  Clinical Documentation Clarification Query left for your review.  If you have any questions, please contact me. Thank you, Toribio Harbour, RN, BSN, CCDS Certified Clinical Documentation Specialist Pager: [phone]  Health Information Management [place]    Note changed to reflect accurately [name]

## 2013-01-29 NOTE — Progress Notes (Addendum)
Subjective: Does not recall that I am his PCP Does not recall whether he got his hearing aids in the past 2 weeks or not, hard of hearing today Is fixated on this pain around his rectum and continually brings this subject up.  Continually asks if I am his surgeon, which I remind him I am not.  No BMs since yesterday AM.   Objective: Vital signs in last 24 hours: Temp:  [97.1 F (36.2 C)-98.4 F (36.9 C)] 97.7 F (36.5 C) (04/16 0654) Pulse Rate:  [57-79] 60 (04/16 0654) Resp:  [13-65] 20 (04/16 0654) BP: (118-194)/(52-114) 118/57 mmHg (04/16 0654) SpO2:  [98 %-100 %] 99 % (04/16 0654) Weight:  [126 lb 15.8 oz (57.6 kg)] 126 lb 15.8 oz (57.6 kg) (04/15 1700)  Intake/Output from previous day: 04/15 0701 - 04/16 0700 In: 781.7 [I.V.:781.7] Out: 1025 [Urine:1025] Intake/Output this shift: Total I/O In: 430 [I.V.:430] Out: -   Gen: elderly male in NAD.  HEENT: hard of hearing. MMM. Trachea midline. EOMI.  Lungs: CTAB. No wheezes or rales  Cardio: RRR, 2/6 SEM  Abd: nontender, no masses, normal BS  Ext: 5/5 UE strength and full ROM, 4/5 LE strength and full ROM w/o pain  Neuro: no focal deficits. Impaired short term memory.   Lab Results   Recent Labs  01/28/13 1744 01/29/13 0458  WBC 9.9 8.8  RBC 3.60* 3.18*  HGB 10.4* 9.0*  HCT 31.2* 27.8*  MCV 86.7 87.4  MCH 28.9 28.3  RDW 15.3 15.1  PLT 140* 142*    Recent Labs  01/28/13 0834 01/29/13 0458  NA 145 141  K 3.5 3.4*  CL 109 109  CO2 31 29  GLUCOSE 100* 91  BUN 13 12  CREATININE 0.87 0.84  CALCIUM 8.8 8.3*    Studies/Results: No results found.  Scheduled Meds: . amLODipine  10 mg Oral Daily  . antiseptic oral rinse  15 mL Mouth Rinse q12n4p  . chlorhexidine  15 mL Mouth Rinse BID  . docusate sodium  100 mg Oral Daily  . feeding supplement  1 Container Oral BID BM  . losartan  100 mg Oral Daily  . multivitamin with minerals  1 tablet Oral Daily  . senna  1 tablet Oral Daily  . sodium chloride  3  mL Intravenous Q12H  . tamsulosin  0.4 mg Oral Daily   Continuous Infusions: . dextrose 5 % and 0.9% NaCl 50 mL/hr at 01/29/13 0736   PRN Meds:ALPRAZolam, hydrocortisone, ondansetron (ZOFRAN) IV, ondansetron, polyethylene glycol, traMADol  Assessment/Plan: // Lower GI bleed w/ acute blood loss anemia: diverticulosis noted on colonoscopy done by Dr Rhea Belton but no active bleeding noted. Hgb dropped 2 gm in past 24 hours. 0 BMs in past 24 hours. Hgb still at goal of > 8. Will recheck CBC this afternoon. GI and Gen Surg still following and appreciate their recs. Continuing liquid diet while his Hgb remains unstable.  // HTN (hypertension):  BP running 110-160s over past 24 hours w/o bradycardia . Continue current management.  //Dementia: short term memory loss present on exam today // Constipation - placing on senna, colace and prn miralax // hemorrhoids - prn anusol cream starting today // PPX - SCDs  // FEN - full liquid diet at this time  //CODE STATUS: living will noted in our records. FULL CODE per discussion with Dr. Conley Rolls  //Ethics: daughter [name] is the HCPOA. Cell [phone] and home [phone]    LOS: 3 days   [name] 01/29/2013,  7:43 AM

## 2013-01-29 NOTE — Progress Notes (Signed)
Occupational Therapy Treatment Patient Details Name: [name] MRN: [id number] DOB: [birth year] Today's Date: 01/29/2013 Time: 8295-6213 OT Time Calculation (min): 18 min  OT Assessment / Plan / Recommendation Comments on Treatment Session Pt did well but does need cueing for safety with hand placement./not to pull up on RW to stand. Discussed all recommendations with pt and daughter who was present.     Follow Up Recommendations  Home health OT;Supervision/Assistance - 24 hour    Barriers to Discharge       Equipment Recommendations  3 in 1 bedside comode    Recommendations for Other Services    Frequency Min 2X/week   Plan Discharge plan remains appropriate    Precautions / Restrictions Precautions Precautions: Fall Restrictions Weight Bearing Restrictions: No        ADL  Grooming: Performed;Wash/dry hands;Teeth care;Min guard Where Assessed - Grooming: Unsupported standing Lower Body Dressing: Supervision/safety;Simulated (simulated to don/doff socks only with supervision) Where Assessed - Lower Body Dressing: Supported sitting Toilet Transfer: Performed;Min guard Statistician Method:  (with RW into bathroom) Acupuncturist: Comfort height toilet;Grab bars Toileting - Clothing Manipulation and Hygiene: Performed;Min guard Where Assessed - Engineer, mining and Hygiene: Sit to stand from 3-in-1 or toilet Equipment Used: Rolling walker ADL Comments: Daughter present in room. Discussed safety considerations for home including using shower stall to do shower rather than stepping over tub initially. He has a shower seat available for shower. Also discussed that since pt tried to pull up on RW to stand from toilet, he may benefit from more UE support at toilet. Daughter isnt sure if there is a grab bar at the toilet at his apt so she will check. She states to go ahead and order a 3in1 to riase toilet up higher as his toilet is a standard and  to  have armrests. Reinforced safety recommendations with pt several times as he is hard of hearing. Emphasized not to pull up on RW to stand.     OT Diagnosis:    OT Problem List:   OT Treatment Interventions:     OT Goals ADL Goals ADL Goal: Grooming - Progress: Progressing toward goals ADL Goal: Lower Body Dressing - Progress: Progressing toward goals ADL Goal: Toilet Transfer - Progress: Progressing toward goals ADL Goal: Toileting - Clothing Manipulation - Progress: Progressing toward goals  Visit Information  Last OT Received On: 01/29/13 Assistance Needed: +1    Subjective Data  Subjective: Is my dental floss here? Patient Stated Goal: agreeable to OT; none stated   Prior Functioning       Cognition  Cognition Arousal/Alertness: Awake/alert Overall Cognitive Status: Within Functional Limits for tasks assessed    Mobility  Transfers Transfers: Sit to Stand;Stand to Sit Sit to Stand: 4: Min guard;With upper extremity assist;From toilet Stand to Sit: 4: Min guard;With upper extremity assist;To chair/3-in-1 Details for Transfer Assistance: min verbal cues for UE placement on grab bar not RW to stand. And to reach back for armrests on recliner to sit.    Exercises      Balance     End of Session OT - End of Session Activity Tolerance: Patient tolerated treatment well Patient left: in chair;with call bell/phone within reach;with chair alarm set;with family/visitor present  GO     Lennox Laity [phone] 01/29/2013, 12:02 PM

## 2013-01-29 NOTE — Progress Notes (Signed)
1 Day Post-Op  Subjective: Pt feels pretty good, denies continued bleeding from rectum since yesterday.  Pt feels good-ambulating through halls.  Tolerating clears and supplements.  Having Bm's and flatus.    Objective: Vital signs in last 24 hours: Temp:  [97.4 F (36.3 C)-98.4 F (36.9 C)] 97.7 F (36.5 C) (04/16 0654) Pulse Rate:  [60-79] 60 (04/16 0654) Resp:  [19-20] 20 (04/16 0654) BP: (118-122)/(52-68) 118/57 mmHg (04/16 0654) SpO2:  [98 %-100 %] 99 % (04/16 0654) Weight:  [126 lb 15.8 oz (57.6 kg)] 126 lb 15.8 oz (57.6 kg) (04/15 1700) Last BM Date: 01/28/13  Intake/Output from previous day: 04/15 0701 - 04/16 0700 In: 781.7 [I.V.:781.7] Out: 1025 [Urine:1025] Intake/Output this shift: Total I/O In: 750 [P.O.:320; I.V.:430] Out: -   PE: Gen:  Alert, NAD, pleasant, HOH, a bit confused Abd: concave, soft, NT/ND, +BS, no HSM   Lab Results:   Recent Labs  01/28/13 1744 01/29/13 0458  WBC 9.9 8.8  HGB 10.4* 9.0*  HCT 31.2* 27.8*  PLT 140* 142*   BMET  Recent Labs  01/28/13 0834 01/29/13 0458  NA 145 141  K 3.5 3.4*  CL 109 109  CO2 31 29  GLUCOSE 100* 91  BUN 13 12  CREATININE 0.87 0.84  CALCIUM 8.8 8.3*   PT/INR  Recent Labs  01/27/13 0800  LABPROT 13.7  INR 1.06   CMP     Component Value Date/Time   NA 141 01/29/2013 0458   K 3.4* 01/29/2013 0458   CL 109 01/29/2013 0458   CO2 29 01/29/2013 0458   GLUCOSE 91 01/29/2013 0458   BUN 12 01/29/2013 0458   CREATININE 0.84 01/29/2013 0458   CALCIUM 8.3* 01/29/2013 0458   PROT 4.6* 01/29/2013 0458   ALBUMIN 2.6* 01/29/2013 0458   AST 12 01/29/2013 0458   ALT 9 01/29/2013 0458   ALKPHOS 39 01/29/2013 0458   BILITOT 0.4 01/29/2013 0458   GFRNONAA 75* 01/29/2013 0458   GFRAA 87* 01/29/2013 0458   Lipase  No results found for this basename: lipase       Studies/Results: No results found.  Anti-infectives: Anti-infectives   None       Assessment/Plan Lower GI Bleed:  Diverticulosis noted  on CSP but no active bleed noted, Hgb dropped 2 gm since yesterday 1.  IVF, pain control, liquid diet 2.  No source identified, if Hgb continues to fall will need Tagged RBC scan & angiography for embolization if positive 3.  Still concerned that he may have some thing in his small bowel bleeding ?cancer?, watch Hgb trends 4.  Will f/u with you  HTN Dementia Constipation Hemorrhoids VTE - scd's    LOS: 3 days    [name], [name] 01/29/2013, 12:57 PM Pager: [phone]

## 2013-01-29 NOTE — Progress Notes (Signed)
Report from [place], [place]. Pt resting in bed, dtr at bedside. Pt denies pain, no c/o at present. Pt assisted oob to chair w/ one assist. Callbell and bedside table in reach. Foley w/ clear, yellow urine. Denies any needs at this time.

## 2013-01-30 DIAGNOSIS — I10 Essential (primary) hypertension: Secondary | ICD-10-CM

## 2013-01-30 DIAGNOSIS — K624 Stenosis of anus and rectum: Secondary | ICD-10-CM

## 2013-01-30 DIAGNOSIS — K922 Gastrointestinal hemorrhage, unspecified: Secondary | ICD-10-CM

## 2013-01-30 LAB — CBC
Hemoglobin: 9 g/dL — ABNORMAL LOW (ref 13.0–17.0)
MCH: 28.8 pg (ref 26.0–34.0)
MCV: 87.9 fL (ref 78.0–100.0)
RBC: 3.13 MIL/uL — ABNORMAL LOW (ref 4.22–5.81)

## 2013-01-30 LAB — TYPE AND SCREEN
Antibody Screen: NEGATIVE
Unit division: 0
Unit division: 0
Unit division: 0

## 2013-01-30 LAB — BASIC METABOLIC PANEL
CO2: 30 mEq/L (ref 19–32)
Chloride: 107 mEq/L (ref 96–112)
Glucose, Bld: 92 mg/dL (ref 70–99)
Potassium: 3.4 mEq/L — ABNORMAL LOW (ref 3.5–5.1)
Sodium: 140 mEq/L (ref 135–145)

## 2013-01-30 MED ORDER — AMLODIPINE BESYLATE 2.5 MG PO TABS: 2.5000 mg | ORAL_TABLET | Freq: Every day | ORAL | Status: DC

## 2013-01-30 MED ORDER — POLYETHYLENE GLYCOL 3350 17 G PO PACK: 17.0000 g | PACK | ORAL | Status: DC

## 2013-01-30 MED ORDER — AMLODIPINE BESYLATE 2.5 MG PO TABS
2.5000 mg | ORAL_TABLET | Freq: Every day | ORAL | Status: DC
Start: 2013-01-30 — End: 2013-01-30
  Administered 2013-01-30: 2.5 mg via ORAL
  Filled 2013-01-30: qty 1

## 2013-01-30 MED ORDER — BISACODYL 10 MG RE SUPP: 10.0000 mg | RECTAL | Status: DC | PRN

## 2013-01-30 MED ORDER — ENSURE COMPLETE PO LIQD: 237.0000 mL | Freq: Two times a day (BID) | ORAL | Status: DC

## 2013-01-30 NOTE — Progress Notes (Signed)
2 Days Post-Op  Subjective: Pt feels great.  Tolerating diet, normal urinating and bm's.  Ambulating well.  No pain or rectal bleeding.  Objective: Vital signs in last 24 hours: Temp:  [97.6 F (36.4 C)-98 F (36.7 C)] 98 F (36.7 C) (04/17 0637) Pulse Rate:  [64-65] 65 (04/17 0637) Resp:  [20] 20 (04/17 0637) BP: (99-124)/(47-61) 115/58 mmHg (04/17 0637) SpO2:  [98 %-100 %] 99 % (04/17 0637) Last BM Date: 01/29/13  Intake/Output from previous day: 04/16 0701 - 04/17 0700 In: 1840 [P.O.:1040; I.V.:800] Out: 725 [Urine:725] Intake/Output this shift:    PE: Gen:  Alert, NAD, pleasant Abd: Soft, NT/ND, +BS, no HSM   Lab Results:   Recent Labs  01/29/13 1646 01/30/13 0505  WBC 10.5 8.2  HGB 9.8* 9.0*  HCT 30.6* 27.5*  PLT 136* 134*   BMET  Recent Labs  01/29/13 0458 01/30/13 0505  NA 141 140  K 3.4* 3.4*  CL 109 107  CO2 29 30  GLUCOSE 91 92  BUN 12 13  CREATININE 0.84 0.89  CALCIUM 8.3* 8.4   PT/INR No results found for this basename: LABPROT, INR,  in the last 72 hours CMP     Component Value Date/Time   NA 140 01/30/2013 0505   K 3.4* 01/30/2013 0505   CL 107 01/30/2013 0505   CO2 30 01/30/2013 0505   GLUCOSE 92 01/30/2013 0505   BUN 13 01/30/2013 0505   CREATININE 0.89 01/30/2013 0505   CALCIUM 8.4 01/30/2013 0505   PROT 4.6* 01/29/2013 0458   ALBUMIN 2.6* 01/29/2013 0458   AST 12 01/29/2013 0458   ALT 9 01/29/2013 0458   ALKPHOS 39 01/29/2013 0458   BILITOT 0.4 01/29/2013 0458   GFRNONAA 74* 01/30/2013 0505   GFRAA 85* 01/30/2013 0505   Lipase  No results found for this basename: lipase       Studies/Results: No results found.  Anti-infectives: Anti-infectives   None       Assessment/Plan Lower GI Bleed: Diverticulosis noted on CSP but no active bleed noted, appears to be more stable, but dropped 0.8gm from yesterday 1. IVF, pain control, regular diet 2. No source identified, if Hgb were to worsen may likely need Tagged RBC scan &  angiography for embolization if positive  3. Still concerned that he may have some thing in his small bowel bleeding ?cancer?, watch Hgb trends  4. Daughter is wanting to send him back to assisted living, so they will continue to monitor Hgb through home health 5. Plan was for d/c today  HTN  Dementia  Constipation  Hemorrhoids  VTE - scd's     LOS: 4 days    DORT, Darianne Muralles 01/30/2013, 10:33 AM Pager: [phone]

## 2013-01-30 NOTE — Progress Notes (Signed)
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

## 2013-01-30 NOTE — Discharge Summary (Signed)
Physician Discharge Summary    [name]  MR#: [id number]  DOB:1921-09-19  Date of Admission: 01/26/2013 Date of Discharge: 01/30/2013  Attending Physician:Saamir Armstrong  Patient's PCP:No primary provider on file.  Consults: Dr Cameron Sprang, GI  [facility]   Discharge Diagnoses: Principal Problem:   Lower GI bleed Active Problems:   HTN (hypertension)   Chronic constipation   Diverticulosis of colon with hemorrhage   Stenosis, anal canal   Discharge Medications:   Medication List    STOP taking these medications       psyllium 95 % Pack  Commonly known as:  HYDROCIL/METAMUCIL      TAKE these medications       ALIGN 4 MG Caps  Take 1 capsule by mouth daily.     amLODipine 2.5 MG tablet  Commonly known as:  NORVASC  Take 1 tablet (2.5 mg total) by mouth daily.     aspirin EC 81 MG tablet  Take 81 mg by mouth daily.     bisacodyl 10 MG suppository  Commonly known as:  DULCOLAX  Place 1 suppository (10 mg total) rectally as needed for constipation (for severe constipation only). Do not use for > 1 week.     docusate sodium 50 MG capsule  Commonly known as:  COLACE  Take by mouth daily.     feeding supplement Liqd  Take 237 mLs by mouth 2 (two) times daily between meals.     hydrocortisone 2.5 % rectal cream  Commonly known as:  ANUSOL-HC  Place 1 application rectally 2 (two) times daily.     hydrocortisone 25 MG suppository  Commonly known as:  ANUSOL-HC  Place 25 mg rectally 2 (two) times daily as needed for hemorrhoids (rectal pain).     losartan 100 MG tablet  Commonly known as:  COZAAR  Take 100 mg by mouth daily.     multivitamin with minerals Tabs  Take 1 tablet by mouth daily.     polyethylene glycol packet  Commonly known as:  MIRALAX / GLYCOLAX  Take 17 g by mouth every other day.     tamsulosin 0.4 MG Caps  Commonly known as:  FLOMAX  Take 0.4 mg by mouth daily.     traMADol 50 MG tablet  Commonly known as:  ULTRAM  Take 50 mg by mouth  2 (two) times daily as needed for pain.        Hospital Procedures: Colonoscopy   History of Present Illness: Pt present for hematochezia   Hospital Course: Hematochezia - Pt was evaluated by GI and CCS. Colonoscopy performed that showed no active bleeding after extensive colonic washing, but did show anal stenosis and moderate diverticular disease. Pt's last bloody BM was during his bowel prep overnight 4/14-15. Since that time he has had a nice sized BM w/o blood. His Hgb has remained stable in the 9-10s x 48 hours. His vitals have remained stable, except for some slight hypotension after his norvasc was doubled from his home dose, which resolved after resuming home dose of norvasc. An extensive conversation was had w/ the patient and daughter. They understand that this could have been a diverticular bleed, but we cannot prove this at this time. They also understand that while he has not bleed in 2 days and the colonoscopy was reassuring in not finding any active bleeding, he still runs the risk of re-bleeding over the next 24-48 hours after discharge. They accept these risks and wish for him to be discharged home  and resume a full diet. They do not wish for surgical expansion of his anal stenosis or any further operations at this time. We will arrange close f/u to recheck Hgb next week at my office   Mild anal canal stenosis - Found during colonoscopy by Dr Rhea Belton. He was able to minimally dilate this stenosis digitally, but further dilation would require general anesthesia surgery. Daughter and patient defer on this at this time. This does explain his rectal pain. We will place patient on daily probiotics and colace w/ every other day miralax to try and ensure soft stools. Will also prescribe dulcolax supp prn in case he does suffer from severe episode of constipation.   HTN - noted to have some hypotension (99/47) on norvasc 10mg  daily, so will decrease this dose back to 2.5mg  daily and monitor  as outpatient.   Deconditioning - Will arrange HHPT   Sacral decub, stage 1 - present on admission. Will arrange for [facility] to assess the wound after discharge   Ethics - had discussion w/ HCPOA, daughter, who wishes to continue w/ full code at this time.     Day of Discharge Exam BP 115/58  Pulse 65  Temp(Src) 98 F (36.7 C) (Oral)  Resp 20  Ht 5\' 11"  (1.803 m)  Wt 126 lb 15.8 oz (57.6 kg)  BMI 17.72 kg/m2  SpO2 99%  Physical Exam: Gen: elderly male in NAD.  HEENT: hard of hearing. MMM. Trachea midline. EOMI.  Lungs: CTAB. No wheezes or rales  Cardio: RRR, 2/6 SEM  Abd: nontender, no masses, normal BS  Ext: 5/5 UE strength and full ROM, 4/5 LE strength and full ROM w/o pain  Neuro: no focal deficits. Impaired short term memory.    Discharge Labs:  Recent Labs  01/29/13 0458 01/30/13 0505  NA 141 140  K 3.4* 3.4*  CL 109 107  CO2 29 30  GLUCOSE 91 92  BUN 12 13  CREATININE 0.84 0.89  CALCIUM 8.3* 8.4    Recent Labs  01/29/13 0458  AST 12  ALT 9  ALKPHOS 39  BILITOT 0.4  PROT 4.6*  ALBUMIN 2.6*    Recent Labs  01/29/13 1646 01/30/13 0505  WBC 10.5 8.2  HGB 9.8* 9.0*  HCT 30.6* 27.5*  MCV 88.4 87.9  PLT 136* 134*   Lab Results  Component Value Date   INR 1.06 01/27/2013   No results found for this basename: CKTOTAL, CKMB, CKMBINDEX, TROPONINI,  in the last 72 hours No results found for this basename: TSH, T4TOTAL, FREET3, T3FREE, THYROIDAB,  in the last 72 hours No results found for this basename: VITAMINB12, FOLATE, FERRITIN, TIBC, IRON, RETICCTPCT,  in the last 72 hours  Discharge instructions:     Discharge Orders   Future Orders Complete By Expires     Call MD for:  As directed     Comments:      Return of rectal bleeding    Diet - low sodium heart healthy  As directed     Discharge instructions  As directed     Comments:      Arranging to have HH PT and SN assess for gait/strength training and sacral decub (stage I) wound care  respectively    Discharge wound care:  As directed     Comments:      Home health skilled nursing to come to home and assess care for the stage I sacral decub.    Driving Restrictions  As directed  Comments:      I recommend against driving if possible.    Increase activity slowly  As directed       01-Home or Self Care   Disposition: [facility] (prior residene)   Follow-up Appts: Follow-up with Dr. Link Snuffer at [facility] in 1 week.  My nurse Sherrilyn Rist will call today for appt arrangement. Please call our office at [phone] if you do not hear from [place] by 3 PM today, as we are closed tomorrow, April 18th for Good Friday.   Condition on Discharge: stable   Tests Needing Follow-up: will repeat CBC next week   Time spent in discharge (includes decision making & examination of pt):  45 minutes    Signed: Elynn Patteson 01/30/2013, 8:50 AM

## 2013-01-30 NOTE — Care Management (Signed)
[facility] accepting this patient for Home Health services following dischg.  Spoke with daughter concerning last episode  of home health services with this patient.  Pt has since changed primary care physician.  Thank You for this referral  Venia Minks, [phone]

## 2013-01-30 NOTE — Progress Notes (Signed)
[facility] called for DME/ 3;1 to be delivered to the patient's room prior to discharge home; [facility] RN,BSN,MHA

## 2013-02-06 ENCOUNTER — Emergency Department (HOSPITAL_COMMUNITY)
Admission: EM | Admit: 2013-02-06 | Discharge: 2013-02-06 | Disposition: A | Discharge status: Home / Self Care | Payer: Medicare Other | Attending: Emergency Medicine | Admitting: Emergency Medicine

## 2013-02-06 ENCOUNTER — Encounter (HOSPITAL_COMMUNITY): Payer: Self-pay | Admitting: Emergency Medicine

## 2013-02-06 DIAGNOSIS — Z9889 Other specified postprocedural states: Secondary | ICD-10-CM | POA: Insufficient documentation

## 2013-02-06 DIAGNOSIS — N400 Enlarged prostate without lower urinary tract symptoms: Secondary | ICD-10-CM | POA: Insufficient documentation

## 2013-02-06 DIAGNOSIS — I10 Essential (primary) hypertension: Secondary | ICD-10-CM | POA: Insufficient documentation

## 2013-02-06 DIAGNOSIS — Z79899 Other long term (current) drug therapy: Secondary | ICD-10-CM | POA: Insufficient documentation

## 2013-02-06 DIAGNOSIS — F411 Generalized anxiety disorder: Secondary | ICD-10-CM | POA: Insufficient documentation

## 2013-02-06 DIAGNOSIS — Z7982 Long term (current) use of aspirin: Secondary | ICD-10-CM | POA: Insufficient documentation

## 2013-02-06 DIAGNOSIS — N390 Urinary tract infection, site not specified: Secondary | ICD-10-CM

## 2013-02-06 DIAGNOSIS — K5900 Constipation, unspecified: Secondary | ICD-10-CM | POA: Insufficient documentation

## 2013-02-06 DIAGNOSIS — R319 Hematuria, unspecified: Secondary | ICD-10-CM | POA: Insufficient documentation

## 2013-02-06 LAB — URINALYSIS, ROUTINE W REFLEX MICROSCOPIC
Bilirubin Urine: NEGATIVE
Glucose, UA: NEGATIVE mg/dL
Protein, ur: 30 mg/dL — AB
Urobilinogen, UA: 0.2 mg/dL (ref 0.0–1.0)

## 2013-02-06 LAB — URINE MICROSCOPIC-ADD ON

## 2013-02-06 MED ORDER — CIPROFLOXACIN HCL 250 MG PO TABS: 250.0000 mg | ORAL_TABLET | Freq: Two times a day (BID) | ORAL | Status: DC

## 2013-02-06 NOTE — ED Provider Notes (Signed)
History     CSN: [id number]  Arrival date & time 02/06/13  1115   First MD Initiated Contact with Patient 02/06/13 1200      Chief Complaint  Patient presents with  . Urinary Frequency    (Consider location/radiation/quality/duration/timing/severity/associated sxs/prior treatment) Patient is a 79 y.o. male presenting with frequency. The history is provided by the patient (pt states he has had some blood in his urine.  sent here from rest home). No language interpreter was used.  Urinary Frequency This is a new problem. The current episode started yesterday. The problem occurs rarely. The problem has not changed since onset.Pertinent negatives include no chest pain, no abdominal pain and no headaches. Nothing aggravates the symptoms. Nothing relieves the symptoms.    Past Medical History  Diagnosis Date  . Hypertension   . Anxiety   . BPH (benign prostatic hypertrophy)   . Constipation, chronic     Past Surgical History  Procedure Laterality Date  . Prostate surgery    . Tonsilectomy/adenoidectomy with myringotomy    . Colonoscopy N/A 01/28/2013    Procedure: COLONOSCOPY;  Surgeon: Beverley Fiedler, MD;  Location: [facility];  Service: Gastroenterology;  Laterality: N/A;    History reviewed. No pertinent family history.  History  Substance Use Topics  . Smoking status: Never Smoker   . Smokeless tobacco: Never Used  . Alcohol Use: No      Review of Systems  Constitutional: Negative for appetite change and fatigue.  HENT: Negative for congestion, sinus pressure and ear discharge.   Eyes: Negative for discharge.  Respiratory: Negative for cough.   Cardiovascular: Negative for chest pain.  Gastrointestinal: Negative for abdominal pain and diarrhea.  Genitourinary: Positive for frequency and hematuria.  Musculoskeletal: Negative for back pain.  Skin: Negative for rash.  Neurological: Negative for seizures and headaches.  Psychiatric/Behavioral: Negative for  hallucinations.    Allergies  Morphine and related  Home Medications   Current Outpatient Rx  Name  Route  Sig  Dispense  Refill  . aspirin EC 81 MG tablet   Oral   Take 81 mg by mouth daily.         Marland Kitchen docusate sodium (COLACE) 50 MG capsule   Oral   Take by mouth daily.         . feeding supplement (ENSURE COMPLETE) LIQD   Oral   Take 237 mLs by mouth 2 (two) times daily between meals.         . hydrocortisone (ANUSOL-HC) 2.5 % rectal cream   Rectal   Place 1 application rectally 2 (two) times daily.         Marland Kitchen losartan (COZAAR) 100 MG tablet   Oral   Take 100 mg by mouth daily.         . Melatonin 5 MG TABS   Oral   Take 1 tablet by mouth at bedtime.         . Multiple Vitamin (MULTIVITAMIN WITH MINERALS) TABS   Oral   Take 1 tablet by mouth daily.         . polyethylene glycol (MIRALAX / GLYCOLAX) packet   Oral   Take 17 g by mouth every other day.   14 each   5   . Probiotic Product (ALIGN) 4 MG CAPS   Oral   Take 1 capsule by mouth daily.         . Tamsulosin HCl (FLOMAX) 0.4 MG CAPS   Oral   Take 0.4  mg by mouth daily.         . traMADol (ULTRAM) 50 MG tablet   Oral   Take 100 mg by mouth daily.            BP 109/55  Pulse 66  Temp(Src) 98.5 F (36.9 C) (Oral)  Resp 20  SpO2 99%  Physical Exam  Constitutional: He is oriented to person, place, and time. He appears well-developed.  HENT:  Head: Normocephalic.  Eyes: Conjunctivae and EOM are normal. No scleral icterus.  Neck: Neck supple. No thyromegaly present.  Cardiovascular: Normal rate and regular rhythm.  Exam reveals no gallop and no friction rub.   No murmur heard. Pulmonary/Chest: No stridor. He has no wheezes. He has no rales. He exhibits no tenderness.  Abdominal: He exhibits no distension. There is no tenderness. There is no rebound.  Musculoskeletal: Normal range of motion. He exhibits no edema.  Lymphadenopathy:    He has no cervical adenopathy.   Neurological: He is oriented to person, place, and time. Coordination normal.  Skin: No rash noted. No erythema.  Psychiatric: He has a normal mood and affect. His behavior is normal.    ED Course  Procedures (including critical care time)  Labs Reviewed  URINALYSIS, ROUTINE W REFLEX MICROSCOPIC   No results found.   No diagnosis found.    MDM          Benny Lennert, MD 02/11/13 [phone]

## 2013-02-06 NOTE — ED Notes (Signed)
YQM:[id number]<ON> Expected date:<BR> Expected time:<BR> Means of arrival:Ambulance<BR> Comments:<BR> ems

## 2013-02-06 NOTE — ED Notes (Signed)
Pt here via ems from [place] at [facility] staff stated he has UTI,pt stated that he saw some blood in urine denies burning or frenq

## 2013-02-06 NOTE — ED Notes (Signed)
Pt unable to void at this time will give fluids

## 2013-02-06 NOTE — ED Notes (Signed)
In and out attempted met with resitance and pain

## 2013-02-08 LAB — URINE CULTURE: Colony Count: >=100000

## 2013-02-09 ENCOUNTER — Telehealth (HOSPITAL_COMMUNITY): Payer: Self-pay | Admitting: Emergency Medicine

## 2013-02-09 NOTE — ED Notes (Signed)
Patient has +Urine culture. °

## 2013-02-09 NOTE — ED Notes (Signed)
+  Urine. Patient treated with Cipro. Sensitive to same. Per protocol MD. °

## 2013-08-25 ENCOUNTER — Ambulatory Visit: Payer: Self-pay | Admitting: Podiatry

## 2014-07-18 ENCOUNTER — Emergency Department (HOSPITAL_COMMUNITY)
Admission: EM | Admit: 2014-07-18 | Discharge: 2014-07-18 | Disposition: A | Discharge status: Home / Self Care | Payer: Medicare Other | Attending: Emergency Medicine | Admitting: Emergency Medicine

## 2014-07-18 ENCOUNTER — Encounter (HOSPITAL_COMMUNITY): Payer: Self-pay | Admitting: Emergency Medicine

## 2014-07-18 ENCOUNTER — Emergency Department (HOSPITAL_COMMUNITY)
Admission: EM | Admit: 2014-07-18 | Discharge: 2014-07-19 | Disposition: A | Discharge status: Home / Self Care | Payer: Medicare Other | Source: Home / Self Care | Attending: Emergency Medicine | Admitting: Emergency Medicine

## 2014-07-18 DIAGNOSIS — Z7982 Long term (current) use of aspirin: Secondary | ICD-10-CM | POA: Insufficient documentation

## 2014-07-18 DIAGNOSIS — R338 Other retention of urine: Secondary | ICD-10-CM

## 2014-07-18 DIAGNOSIS — Z792 Long term (current) use of antibiotics: Secondary | ICD-10-CM | POA: Diagnosis not present

## 2014-07-18 DIAGNOSIS — Z7952 Long term (current) use of systemic steroids: Secondary | ICD-10-CM | POA: Diagnosis not present

## 2014-07-18 DIAGNOSIS — I10 Essential (primary) hypertension: Secondary | ICD-10-CM | POA: Insufficient documentation

## 2014-07-18 DIAGNOSIS — Z79899 Other long term (current) drug therapy: Secondary | ICD-10-CM | POA: Insufficient documentation

## 2014-07-18 DIAGNOSIS — T839XXA Unspecified complication of genitourinary prosthetic device, implant and graft, initial encounter: Secondary | ICD-10-CM

## 2014-07-18 DIAGNOSIS — Z9889 Other specified postprocedural states: Secondary | ICD-10-CM | POA: Insufficient documentation

## 2014-07-18 DIAGNOSIS — Z8659 Personal history of other mental and behavioral disorders: Secondary | ICD-10-CM | POA: Insufficient documentation

## 2014-07-18 DIAGNOSIS — R339 Retention of urine, unspecified: Secondary | ICD-10-CM | POA: Insufficient documentation

## 2014-07-18 DIAGNOSIS — K6289 Other specified diseases of anus and rectum: Secondary | ICD-10-CM | POA: Diagnosis not present

## 2014-07-18 DIAGNOSIS — N401 Enlarged prostate with lower urinary tract symptoms: Secondary | ICD-10-CM

## 2014-07-18 DIAGNOSIS — K5900 Constipation, unspecified: Secondary | ICD-10-CM | POA: Diagnosis not present

## 2014-07-18 LAB — I-STAT CHEM 8, ED
BUN: 32 mg/dL — ABNORMAL HIGH (ref 6–23)
CHLORIDE: 106 meq/L (ref 96–112)
Calcium, Ion: 1.18 mmol/L (ref 1.13–1.30)
Creatinine, Ser: 1.3 mg/dL (ref 0.50–1.35)
Glucose, Bld: 93 mg/dL (ref 70–99)
HEMATOCRIT: 43 % (ref 39.0–52.0)
Hemoglobin: 14.6 g/dL (ref 13.0–17.0)
POTASSIUM: 4 meq/L (ref 3.7–5.3)
SODIUM: 140 meq/L (ref 137–147)
TCO2: 21 mmol/L (ref 0–100)

## 2014-07-18 LAB — URINALYSIS, ROUTINE W REFLEX MICROSCOPIC
BILIRUBIN URINE: NEGATIVE
Glucose, UA: NEGATIVE mg/dL
KETONES UR: NEGATIVE mg/dL
Leukocytes, UA: NEGATIVE
Nitrite: NEGATIVE
PROTEIN: NEGATIVE mg/dL
Specific Gravity, Urine: 1.025 (ref 1.005–1.030)
UROBILINOGEN UA: 0.2 mg/dL (ref 0.0–1.0)
pH: 5.5 (ref 5.0–8.0)

## 2014-07-18 LAB — URINE MICROSCOPIC-ADD ON

## 2014-07-18 MED ORDER — LIDOCAINE HCL 2 % EX GEL
1.0000 "application " | Freq: Once | CUTANEOUS | Status: AC
  Administered 2014-07-18: 1 via URETHRAL
  Filled 2014-07-18: qty 10

## 2014-07-18 MED ORDER — TAMSULOSIN HCL 0.4 MG PO CAPS: 0.4000 mg | ORAL_CAPSULE | Freq: Every day | ORAL | Status: DC

## 2014-07-18 MED ORDER — FINASTERIDE 5 MG PO TABS: 5.0000 mg | ORAL_TABLET | Freq: Every day | ORAL | Status: DC

## 2014-07-18 NOTE — ED Provider Notes (Signed)
CSN: [id number]     Arrival date & time 07/18/14  2245 History   First MD Initiated Contact with Patient 07/18/14 2307     Chief Complaint  Patient presents with  . Penis Pain     (Consider location/radiation/quality/duration/timing/severity/associated sxs/prior Treatment) HPI  Patient to the ED for evaluation of his foley catheter. He has dementia as well as his wife. They live at home with full time caregivers. He was seen by myself this evening around 2107. He says "I touched the tube I wasn't supposed to and it hurt. I do not hurt now." the caregiver says, " he went to the bathroom while I was cooking and I didn't have the chance to remind him not to touch the catheter tube. He did not have bleeding and did not pull it out completely.   The catheter was placed this evening by the Urologist Dr. Wynetta Emery ( on-call resident) for BPH. He was started on Finasteride and Flomax. Pt doing well and denies complaints. No bleeding  Past Medical History  Diagnosis Date  . Hypertension   . Anxiety   . BPH (benign prostatic hypertrophy)   . Constipation, chronic    Past Surgical History  Procedure Laterality Date  . Prostate surgery    . Tonsilectomy/adenoidectomy with myringotomy    . Colonoscopy N/A 01/28/2013    Procedure: COLONOSCOPY;  Surgeon: Jerene Bears, MD;  Location: [facility];  Service: Gastroenterology;  Laterality: N/A;   History reviewed. No pertinent family history. History  Substance Use Topics  . Smoking status: Never Smoker   . Smokeless tobacco: Never Used  . Alcohol Use: No    Review of Systems  All other systems reviewed and are negative.     Allergies  Morphine and related  Home Medications   Prior to Admission medications   Medication Sig Start Date End Date Taking? Authorizing Provider  finasteride (PROSCAR) 5 MG tablet Take 1 tablet (5 mg total) by mouth daily. 07/18/14  Yes Irena Gaydos Marilu Favre, PA-C  Tamsulosin HCl (FLOMAX) 0.4 MG CAPS Take 0.4 mg by  mouth daily.   Yes Historical Provider, MD  aspirin EC 81 MG tablet Take 81 mg by mouth daily.    Historical Provider, MD  ciprofloxacin (CIPRO) 250 MG tablet Take 1 tablet (250 mg total) by mouth every 12 (twelve) hours. 02/06/13   Julianne Rice, MD  docusate sodium (COLACE) 50 MG capsule Take by mouth daily.    Historical Provider, MD  feeding supplement (ENSURE COMPLETE) LIQD Take 237 mLs by mouth 2 (two) times daily between meals. 01/30/13   Velna Hatchet, MD  hydrocortisone (ANUSOL-HC) 2.5 % rectal cream Place 1 application rectally 2 (two) times daily.    Historical Provider, MD  losartan (COZAAR) 100 MG tablet Take 100 mg by mouth daily.    Historical Provider, MD  Melatonin 5 MG TABS Take 1 tablet by mouth at bedtime.    Historical Provider, MD  Multiple Vitamin (MULTIVITAMIN WITH MINERALS) TABS Take 1 tablet by mouth daily.    Historical Provider, MD  polyethylene glycol (MIRALAX / GLYCOLAX) packet Take 17 g by mouth every other day. 01/30/13   Velna Hatchet, MD  Probiotic Product (ALIGN) 4 MG CAPS Take 1 capsule by mouth daily.    Historical Provider, MD  tamsulosin (FLOMAX) 0.4 MG CAPS capsule Take 1 capsule (0.4 mg total) by mouth daily. 07/18/14   Axell Trigueros Marilu Favre, PA-C  traMADol (ULTRAM) 50 MG tablet Take 100 mg by mouth daily.  Historical Provider, MD   BP 165/87  Pulse 84  Temp(Src) 97.8 F (36.6 C) (Oral)  Resp 18  SpO2 99% Physical Exam  Nursing note and vitals reviewed. Constitutional: He appears well-developed and well-nourished. No distress.  HENT:  Head: Normocephalic and atraumatic.  Eyes: Pupils are equal, round, and reactive to light.  Neck: Normal range of motion. Neck supple.  Cardiovascular: Normal rate and regular rhythm.   Pulmonary/Chest: Effort normal.  Abdominal: Soft.  Genitourinary:  Foley catheter in place without signs of leaking, bleeding or blood clots. No tenderness to penis.  Neurological: He is alert.  Skin: Skin is warm and dry.    ED  Course  Procedures (including critical care time) Labs Review Labs Reviewed - No data to display  Imaging Review No results found.   EKG Interpretation None      MDM   Final diagnoses:  Foley catheter problem, initial encounter    00:10 Intake/Output ON Output (mL) - Urine: 40 mL (From irigation: clear, no blood clots noted with output, pt denies pain during irigation procedure.)  Urine Characteristics - Urinary Interventions: Bladder scan ; Bladder Scan Volume (mL): (<10) Oscar, RN  Caregiver will be sure to let all caregivers know to remind him everytime he goes to the restroom not to pull the tube.  Filed Vitals:   07/18/14 2245  BP: 165/87  Pulse: 84  Temp: 97.8 F (36.6 C)  Resp: [address], PA-C 07/19/14 0022

## 2014-07-18 NOTE — Consult Note (Signed)
Urology Consult   Physician requesting consult: Dr. Sharmon Leyden  Reason for consult: Urinary retention, unable to catheterize  History of Present Illness: [name] is a 80 y.o. male with dementia, BPH, and history of an unknown prostate surgery in the past who presents to the ED with no urine output for 24 hours. Bladder scan >1L. ER staff unable to pass a catheter due to resistance at the prostatic urethra. Urology was consulted at this point.  Patient is a difficult historian due to dementia. He is unable to relate any useful history. It appears he had a kidney stone that passed as of 2012. No mention of his prostate surgery at that point.   Past Medical History  Diagnosis Date  . Hypertension   . Anxiety   . BPH (benign prostatic hypertrophy)   . Constipation, chronic     Past Surgical History  Procedure Laterality Date  . Prostate surgery    . Tonsilectomy/adenoidectomy with myringotomy    . Colonoscopy N/A 01/28/2013    Procedure: COLONOSCOPY;  Surgeon: Jerene Bears, MD;  Location: [facility];  Service: Gastroenterology;  Laterality: N/A;    Medications:  Home meds:    Medication List    ASK your doctor about these medications       ALIGN 4 MG Caps  Take 1 capsule by mouth daily.     aspirin EC 81 MG tablet  Take 81 mg by mouth daily.     ciprofloxacin 250 MG tablet  Commonly known as:  CIPRO  Take 1 tablet (250 mg total) by mouth every 12 (twelve) hours.     docusate sodium 50 MG capsule  Commonly known as:  COLACE  Take by mouth daily.     feeding supplement (ENSURE COMPLETE) Liqd  Take 237 mLs by mouth 2 (two) times daily between meals.     hydrocortisone 2.5 % rectal cream  Commonly known as:  ANUSOL-HC  Place 1 application rectally 2 (two) times daily.     losartan 100 MG tablet  Commonly known as:  COZAAR  Take 100 mg by mouth daily.     Melatonin 5 MG Tabs  Take 1 tablet by mouth at bedtime.     multivitamin with minerals Tabs tablet   Take 1 tablet by mouth daily.     polyethylene glycol packet  Commonly known as:  MIRALAX / GLYCOLAX  Take 17 g by mouth every other day.     tamsulosin 0.4 MG Caps capsule  Commonly known as:  FLOMAX  Take 0.4 mg by mouth daily.     traMADol 50 MG tablet  Commonly known as:  ULTRAM  Take 100 mg by mouth daily.        Scheduled Meds: . lidocaine  1 application Urethral Once   Continuous Infusions:  PRN Meds:.  Allergies:  Allergies  Allergen Reactions  . Morphine And Related Nausea And Vomiting    No family history on file.  Social History:  reports that he has never smoked. He has never used smokeless tobacco. He reports that he does not drink alcohol or use illicit drugs.  ROS: A complete review of systems was performed.  All systems are negative except for pertinent findings as noted.  Physical Exam:  Vital signs in last 24 hours: Temp:  [97.9 F (36.6 C)] 97.9 F (36.6 C) (10/03 1846) Pulse Rate:  [80] 80 (10/03 1846) Resp:  [18] 18 (10/03 1846) BP: (157)/(91) 157/91 mmHg (10/03 1846) SpO2:  [100 %]  100 % (10/03 1846) Constitutional:  Alert and oriented, No acute distress Cardiovascular: Regular rate and rhythm, No JVD Respiratory: Normal respiratory effort, Lungs clear bilaterally GI: Abdomen is soft, nontender, nondistended, no abdominal masses Genitourinary: No CVAT. Normal uncircumcised male phallus, testes are descended bilaterally and non-tender and without masses, scrotum is normal in appearance without lesions or masses, perineum is normal on inspection. Lymphatic: No lymphadenopathy Neurologic: Grossly intact, no focal deficits Psychiatric: Normal mood and affect  Laboratory Data:  No results found for this basename: WBC, HGB, HCT, PLT,  in the last 72 hours  No results found for this basename: NA, K, CL, CO3, GLUCOSE, BUN, CALCIUM, CREATININE,  in the last 72 hours   No results found for this or any previous visit (from the past 24  hour(s)). No results found for this or any previous visit (from the past 240 hour(s)).  Renal Function: No results found for this basename: CREATININE,  in the last 168 hours The CrCl is unknown because both a height and weight (above a minimum accepted value) are required for this calculation.  Radiologic Imaging: No results found.  I independently reviewed the above imaging studies.  Procedure: Difficult foley catheter.   Patient was prepped and draped in the usual fashion. Viscious lidocaine was injected into the urethra. An 18Fr coude tip foley was placed with some resistance either at the bladder neck or the prostatic urethra. Clear yellow urine returned. The foley was secured in place.  Impression/Recommendation 24M with history of BPH s/p a "prostate surgery" which was presumably a TURP, but unable to obtain from the patient's history or records. He had 1L in his bladder on bladder scan. Foley was placed without significant difficulty.   -- recommend initiating finasteride 5mg  PO daily and continuing tamsulosin -- urine culture -- will defer dispo and management of possible post-obstructive diuresis to ED team to decide whether he needs to be admitted based on labs, which are not back yet. -- patient will need to follow up with [facility] in 7-10 days for trial of void

## 2014-07-18 NOTE — ED Notes (Signed)
Pt sent from [facility] for urinary retention.  Pt states that he has had to pee all day. Pt's caregiver states that when he tries, he only gets a little bit out and then states that the urge has passed.  Pt also feels like he has to have a BM.  Pt went to [facility] and they found that his bladder was enlarged.  Pt is not in any pain at the moment.

## 2014-07-18 NOTE — ED Provider Notes (Signed)
CSN: [id number]     Arrival date & time 07/18/14  1831 History   First MD Initiated Contact with Patient 07/18/14 1857     Chief Complaint  Patient presents with  . Urinary Retention     (Consider location/radiation/quality/duration/timing/severity/associated sxs/prior Treatment) HPI  Patient who is a [employer] v[employer]  to the ER with complaints or urinary retention. He has been brought in by his caregiver. He lives at home with full time home health, per the caregiver who is present. He has some dementia. His retention has persisted for 24 hours. He is unable to get more urine passed than a few drops. He has a history of a BPH but known past surgical prostate history. The patient is having suprapubic pain. Has not had nausea, vomiting, diarrhea, or weakness.Otherwise, the patient is acting at baseline.  Past Medical History  Diagnosis Date  . Hypertension   . Anxiety   . BPH (benign prostatic hypertrophy)   . Constipation, chronic    Past Surgical History  Procedure Laterality Date  . Prostate surgery    . Tonsilectomy/adenoidectomy with myringotomy    . Colonoscopy N/A 01/28/2013    Procedure: COLONOSCOPY;  Surgeon: Jerene Bears, MD;  Location: [facility];  Service: Gastroenterology;  Laterality: N/A;   No family history on file. History  Substance Use Topics  . Smoking status: Never Smoker   . Smokeless tobacco: Never Used  . Alcohol Use: No    Review of Systems  All other systems reviewed and are negative.     Allergies  Morphine and related  Home Medications   Prior to Admission medications   Medication Sig Start Date End Date Taking? Authorizing Provider  aspirin EC 81 MG tablet Take 81 mg by mouth daily.    Historical Provider, MD  ciprofloxacin (CIPRO) 250 MG tablet Take 1 tablet (250 mg total) by mouth every 12 (twelve) hours. 02/06/13   Julianne Rice, MD  docusate sodium (COLACE) 50 MG capsule Take by mouth daily.    Historical Provider, MD  feeding  supplement (ENSURE COMPLETE) LIQD Take 237 mLs by mouth 2 (two) times daily between meals. 01/30/13   Velna Hatchet, MD  finasteride (PROSCAR) 5 MG tablet Take 1 tablet (5 mg total) by mouth daily. 07/18/14   Damoni Causby Marilu Favre, PA-C  hydrocortisone (ANUSOL-HC) 2.5 % rectal cream Place 1 application rectally 2 (two) times daily.    Historical Provider, MD  losartan (COZAAR) 100 MG tablet Take 100 mg by mouth daily.    Historical Provider, MD  Melatonin 5 MG TABS Take 1 tablet by mouth at bedtime.    Historical Provider, MD  Multiple Vitamin (MULTIVITAMIN WITH MINERALS) TABS Take 1 tablet by mouth daily.    Historical Provider, MD  polyethylene glycol (MIRALAX / GLYCOLAX) packet Take 17 g by mouth every other day. 01/30/13   Velna Hatchet, MD  Probiotic Product (ALIGN) 4 MG CAPS Take 1 capsule by mouth daily.    Historical Provider, MD  tamsulosin (FLOMAX) 0.4 MG CAPS capsule Take 1 capsule (0.4 mg total) by mouth daily. 07/18/14   Rayelynn Loyal Marilu Favre, PA-C  Tamsulosin HCl (FLOMAX) 0.4 MG CAPS Take 0.4 mg by mouth daily.    Historical Provider, MD  traMADol (ULTRAM) 50 MG tablet Take 100 mg by mouth daily.     Historical Provider, MD   BP 157/91  Pulse 80  Temp(Src) 97.9 F (36.6 C) (Oral)  Resp 18  SpO2 100% Physical Exam  Nursing note and vitals reviewed.  Constitutional: He appears well-developed and well-nourished. No distress.  HENT:  Head: Normocephalic and atraumatic.  Eyes: Pupils are equal, round, and reactive to light.  Neck: Normal range of motion. Neck supple.  Cardiovascular: Normal rate and regular rhythm.   Pulmonary/Chest: Effort normal.  Abdominal: Soft. He exhibits distension. There is tenderness in the suprapubic area. There is no rigidity, no rebound and no guarding.  Neurological: He is alert.  Skin: Skin is warm and dry.    ED Course  Procedures (including critical care time) Labs Review Labs Reviewed  URINALYSIS, ROUTINE W REFLEX MICROSCOPIC - Abnormal; Notable for  the following:    Color, Urine AMBER (*)    APPearance CLOUDY (*)    Hgb urine dipstick LARGE (*)    All other components within normal limits  URINE MICROSCOPIC-ADD ON - Abnormal; Notable for the following:    Bacteria, UA FEW (*)    Casts HYALINE CASTS (*)    All other components within normal limits  I-STAT CHEM 8, ED - Abnormal; Notable for the following:    BUN 32 (*)    All other components within normal limits  URINE CULTURE    Imaging Review No results found.   EKG Interpretation None      MDM   Final diagnoses:  Urinary retention due to benign prostatic hyperplasia    Dr. Sabra Heck has seen patient as well. Pt allowed [place] to attempt to place foley catheter. Unfortunately, they were unable to drain the bladder. Dr. Wynetta Emery with Urology was kind enough to come to the ED to place foley catheter. He recommends starting him on Finasteride 5 mg daily and Flomax. Will need to follow-up with urology about Monday October 12. Catheter will need to stay in place for 7-10 days. I-stat chem 8 and urinalysis are pending.   BUN is elevated at 32. This will need to be rechecked at follow-up visit with [facility]. At this time, pt is ready for discharge.  80 y.o.Quin Hoop Goodrich's evaluation in the Emergency Department is complete. It has been determined that no acute conditions requiring further emergency intervention are present at this time. The patient/guardian have been advised of the diagnosis and plan. We have discussed signs and symptoms that warrant return to the ED, such as changes or worsening in symptoms.  Vital signs are stable at discharge. Filed Vitals:   07/18/14 1846  BP: 157/91  Pulse: 80  Temp: 97.9 F (36.6 C)  Resp: 18    Patient/guardian has voiced understanding and agreed to follow-up with the PCP or specialist.     Linus Mako, PA-C 07/18/14 2107

## 2014-07-18 NOTE — Discharge Instructions (Signed)
Acute Urinary Retention Acute urinary retention is the temporary inability to urinate. This is a common problem in older men. As men age their prostates become larger and block the flow of urine from the bladder. This is usually a problem that has come on gradually.  HOME CARE INSTRUCTIONS If you are sent home with a Foley catheter and a drainage system, you will need to discuss the best course of action with your health care provider. While the catheter is in, maintain a good intake of fluids. Keep the drainage bag emptied and lower than your catheter. This is so that contaminated urine will not flow back into your bladder, which could lead to a urinary tract infection. There are two main types of drainage bags. One is a large bag that usually is used at night. It has a good capacity that will allow you to sleep through the night without having to empty it. The second type is called a leg bag. It has a smaller capacity, so it needs to be emptied more frequently. However, the main advantage is that it can be attached by a leg strap and can go underneath your clothing, allowing you the freedom to move about or leave your home. Only take over-the-counter or prescription medicines for pain, discomfort, or fever as directed by your health care provider.  SEEK MEDICAL CARE IF:  You develop a low-grade fever.  You experience spasms or leakage of urine with the spasms. SEEK IMMEDIATE MEDICAL CARE IF:   You develop chills or fever.  Your catheter stops draining urine.  Your catheter falls out.  You start to develop increased bleeding that does not respond to rest and increased fluid intake. MAKE SURE YOU:  Understand these instructions.  Will watch your condition.  Will get help right away if you are not doing well or get worse. Document Released: 01/08/2001 Document Revised: 10/07/2013 Document Reviewed: 03/13/2013 [facility] Patient Information 2015 [place], [place]. This information is not  intended to replace advice given to you by your health care provider. Make sure you discuss any questions you have with your health care provider.  Benign Prostatic Hyperplasia An enlarged prostate (benign prostatic hyperplasia) is common in older men. You may experience the following:  Weak urine stream.  Dribbling.  Feeling like the bladder has not emptied completely.  Difficulty starting urination.  Getting up frequently at night to urinate.  Urinating more frequently during the day. HOME CARE INSTRUCTIONS  Monitor your prostatic hyperplasia for any changes. The following actions may help to alleviate any discomfort you are experiencing:  Give yourself time when you urinate.  Stay away from alcohol.  Avoid beverages containing caffeine, such as coffee, tea, and colas, because they can make the problem worse.  Avoid decongestants, antihistamines, and some prescription medicines that can make the problem worse.  Follow up with your health care provider for further treatment as recommended. SEEK MEDICAL CARE IF:  You are experiencing progressive difficulty voiding.  Your urine stream is progressively getting narrower.  You are awaking from sleep with the urge to void more frequently.  You are constantly feeling the need to void.  You experience loss of urine, especially in small amounts. SEEK IMMEDIATE MEDICAL CARE IF:   You develop increased pain with urination or are unable to urinate.  You develop severe abdominal pain, vomiting, a high fever, or fainting.  You develop back pain or blood in your urine. MAKE SURE YOU:   Understand these instructions.  Will watch your condition.  Will get help right away if you are not doing well or get worse. Document Released: 10/02/2005 Document Revised: 06/04/2013 Document Reviewed: 03/04/2013 [facility] Patient Information 2015 [place], [place]. This information is not intended to replace advice given to you by your health  care provider. Make sure you discuss any questions you have with your health care provider.

## 2014-07-18 NOTE — ED Notes (Signed)
Bladder scanner =  >than 1041mL

## 2014-07-18 NOTE — ED Notes (Signed)
MD at bedside. 

## 2014-07-18 NOTE — ED Notes (Addendum)
Pt ambulated to BR with his walker and back to room with only stand-by assist

## 2014-07-18 NOTE — ED Notes (Signed)
Pt arrived to the ED with a complaint of penis pain after pulling on the catheter that had been placed earlier tonight in the ED.  Small amount of blood observed on pt's pants

## 2014-07-18 NOTE — ED Notes (Signed)
Pt daughter [phone]) called from [place], RN verified identity for security purpose, DOB, full names and pt's address, updated her on pt's care, she requested to be notified on pt discharge.

## 2014-07-18 NOTE — ED Notes (Signed)
Bed: WA05 Expected date:  Expected time:  Means of arrival:  Comments: 

## 2014-07-18 NOTE — ED Notes (Signed)
Pt's caregiver educated on the importance of maintaining urinary catheter; this was after home caregiver had requested that the catheter be discontinued as the pt "will not let it stay in place." RN educated caregiver on the importance of maintaining high standards hygiene to counter the risk of UTI, also educated on the importance of frequent emptying the leg bag that the pt is being discharged with. Caregiver verbalized understanding this particular discharge instructions.

## 2014-07-18 NOTE — ED Provider Notes (Signed)
Elderly male who presents with urinary retention times one day, on exam the patient has a soft abdomen with a mass in the lower abdomen, greater than 1000 cc of urine in the urinary bladder, Foley catheter placed by urology as nursing unable to place even with coud catheter. Otherwise the patient appears stable for discharge.  Medical screening examination/treatment/procedure(s) were conducted as a shared visit with non-physician practitioner(s) and myself.  I personally evaluated the patient during the encounter.  Clinical Impression:   Final diagnoses:  Urinary retention due to benign prostatic hyperplasia         Johnna Acosta, MD 07/18/14 2333

## 2014-07-19 ENCOUNTER — Emergency Department (HOSPITAL_COMMUNITY)
Admission: EM | Admit: 2014-07-19 | Discharge: 2014-07-19 | Disposition: A | Discharge status: Home / Self Care | Payer: Medicare Other | Attending: Emergency Medicine | Admitting: Emergency Medicine

## 2014-07-19 ENCOUNTER — Encounter (HOSPITAL_COMMUNITY): Payer: Self-pay | Admitting: Emergency Medicine

## 2014-07-19 DIAGNOSIS — K5900 Constipation, unspecified: Secondary | ICD-10-CM

## 2014-07-19 DIAGNOSIS — N400 Enlarged prostate without lower urinary tract symptoms: Secondary | ICD-10-CM | POA: Diagnosis not present

## 2014-07-19 DIAGNOSIS — Z79899 Other long term (current) drug therapy: Secondary | ICD-10-CM | POA: Insufficient documentation

## 2014-07-19 DIAGNOSIS — F419 Anxiety disorder, unspecified: Secondary | ICD-10-CM | POA: Diagnosis not present

## 2014-07-19 DIAGNOSIS — K6289 Other specified diseases of anus and rectum: Secondary | ICD-10-CM | POA: Diagnosis present

## 2014-07-19 DIAGNOSIS — I10 Essential (primary) hypertension: Secondary | ICD-10-CM | POA: Insufficient documentation

## 2014-07-19 DIAGNOSIS — Z7982 Long term (current) use of aspirin: Secondary | ICD-10-CM | POA: Diagnosis not present

## 2014-07-19 DIAGNOSIS — Z7952 Long term (current) use of systemic steroids: Secondary | ICD-10-CM | POA: Insufficient documentation

## 2014-07-19 MED ORDER — DOCUSATE SODIUM 100 MG PO CAPS
100.0000 mg | ORAL_CAPSULE | Freq: Once | ORAL | Status: AC
  Administered 2014-07-19: 100 mg via ORAL
  Filled 2014-07-19: qty 1

## 2014-07-19 MED ORDER — BISACODYL 10 MG RE SUPP
10.0000 mg | Freq: Once | RECTAL | Status: AC
  Administered 2014-07-19: 10 mg via RECTAL
  Filled 2014-07-19: qty 1

## 2014-07-19 NOTE — ED Notes (Signed)
Pt sts did not get meds filled from yesterday per EMS

## 2014-07-19 NOTE — Consult Note (Signed)
Patient discussed with Dr. Wynetta Emery. I agree with assessment and plan.

## 2014-07-19 NOTE — ED Notes (Signed)
Pt has stool in rectum that needs to come out, causing pain.  Pt very anxious and keeps saying he needs to go home to his wife she is alone there.

## 2014-07-19 NOTE — ED Notes (Signed)
lidocaine cream and sitz baths for rectal fissure.  Daughter is on phone [phone]

## 2014-07-19 NOTE — ED Notes (Signed)
[name] -Daughter

## 2014-07-19 NOTE — Discharge Instructions (Signed)
Foley Catheter Care °A Foley catheter is a soft, flexible tube that is placed into the bladder to drain urine. A Foley catheter may be inserted if: °· You leak urine or are not able to control when you urinate (urinary incontinence). °· You are not able to urinate when you need to (urinary retention). °· You had prostate surgery or surgery on the genitals. °· You have certain medical conditions, such as multiple sclerosis, dementia, or a spinal cord injury. °If you are going home with a Foley catheter in place, follow the instructions below. °TAKING CARE OF THE CATHETER °1. Wash your hands with soap and water. °2. Using mild soap and warm water on a clean washcloth: °· Clean the area on your body closest to the catheter insertion site using a circular motion, moving away from the catheter. Never wipe toward the catheter because this could sweep bacteria up into the urethra and cause infection. °· Remove all traces of soap. Pat the area dry with a clean towel. For males, reposition the foreskin. °3. Attach the catheter to your leg so there is no tension on the catheter. Use adhesive tape or a leg strap. If you are using adhesive tape, remove any sticky residue left behind by the previous tape you used. °4. Keep the drainage bag below the level of the bladder, but keep it off the floor. °5. Check throughout the day to be sure the catheter is working and urine is draining freely. Make sure the tubing does not become kinked. °6. Do not pull on the catheter or try to remove it. Pulling could damage internal tissues. °TAKING CARE OF THE DRAINAGE BAGS °You will be given two drainage bags to take home. One is a large overnight drainage bag, and the other is a smaller leg bag that fits underneath clothing. You may wear the overnight bag at any time, but you should never wear the smaller leg bag at night. Follow the instructions below for how to empty, change, and clean your drainage bags. °Emptying the Drainage Bag °You must  empty your drainage bag when it is  -½ full or at least 2-3 times a day. °1. Wash your hands with soap and water. °2. Keep the drainage bag below your hips, below the level of your bladder. This stops urine from going back into the tubing and into your bladder. °3. Hold the dirty bag over the toilet or a clean container. °4. Open the pour spout at the bottom of the bag and empty the urine into the toilet or container. Do not let the pour spout touch the toilet, container, or any other surface. Doing so can place bacteria on the bag, which can cause an infection. °5. Clean the pour spout with a gauze pad or cotton ball that has rubbing alcohol on it. °6. Close the pour spout. °7. Attach the bag to your leg with adhesive tape or a leg strap. °8. Wash your hands well. °Changing the Drainage Bag °Change your drainage bag once a month or sooner if it starts to smell bad or look dirty. Below are steps to follow when changing the drainage bag. °1. Wash your hands with soap and water. °2. Pinch off the rubber catheter so that urine does not spill out. °3. Disconnect the catheter tube from the drainage tube at the connection valve. Do not let the tubes touch any surface. °4. Clean the end of the catheter tube with an alcohol wipe. Use a different alcohol wipe to clean the   end of the drainage tube. °5. Connect the catheter tube to the drainage tube of the clean drainage bag. °6. Attach the new bag to the leg with adhesive tape or a leg strap. Avoid attaching the new bag too tightly. °7. Wash your hands well. °Cleaning the Drainage Bag °1. Wash your hands with soap and water. °2. Wash the bag in warm, soapy water. °3. Rinse the bag thoroughly with warm water. °4. Fill the bag with a solution of white vinegar and water (1 cup vinegar to 1 qt warm water [.2 L vinegar to 1 L warm water]). Close the bag and soak it for 30 minutes in the solution. °5. Rinse the bag with warm water. °6. Hang the bag to dry with the pour spout open  and hanging downward. °7. Store the clean bag (once it is dry) in a clean plastic bag. °8. Wash your hands well. °PREVENTING INFECTION °· Wash your hands before and after handling your catheter. °· Take showers daily and wash the area where the catheter enters your body. Do not take baths. Replace wet leg straps with dry ones, if this applies. °· Do not use powders, sprays, or lotions on the genital area. Only use creams, lotions, or ointments as directed by your caregiver. °· For females, wipe from front to back after each bowel movement. °· Drink enough fluids to keep your urine clear or pale yellow unless you have a fluid restriction. °· Do not let the drainage bag or tubing touch or lie on the floor. °· Wear cotton underwear to absorb moisture and to keep your skin drier. °SEEK MEDICAL CARE IF:  °· Your urine is cloudy or smells unusually bad. °· Your catheter becomes clogged. °· You are not draining urine into the bag or your bladder feels full. °· Your catheter starts to leak. °SEEK IMMEDIATE MEDICAL CARE IF:  °· You have pain, swelling, redness, or pus where the catheter enters the body. °· You have pain in the abdomen, legs, lower back, or bladder. °· You have a fever. °· You see blood fill the catheter, or your urine is pink or red. °· You have nausea, vomiting, or chills. °· Your catheter gets pulled out. °MAKE SURE YOU:  °· Understand these instructions. °· Will watch your condition. °· Will get help right away if you are not doing well or get worse. °Document Released: 10/02/2005 Document Revised: 02/16/2014 Document Reviewed: 09/23/2012 °[employer]® Patient Information ©2015 [employer]. This information is not intended to replace advice given to you by your health care provider. Make sure you discuss any questions you have with your health care provider. ° °

## 2014-07-19 NOTE — Discharge Instructions (Signed)
It was our pleasure to provide your ER care today - we hope that you feel better.  Drink plenty of fluids. Get adequate fiber in diet. Take colace (stool softener) 2x/day. Also take miralax (laxative) as need.   Follow up with primary care doctor in coming week.  Return to ER if worse, new symptoms, vomiting, fevers, other concern.    Constipation Constipation is when a person has fewer than three bowel movements a week, has difficulty having a bowel movement, or has stools that are dry, hard, or larger than normal. As people grow older, constipation is more common. If you try to fix constipation with medicines that make you have a bowel movement (laxatives), the problem may get worse. Long-term laxative use may cause the muscles of the colon to become weak. A low-fiber diet, not taking in enough fluids, and taking certain medicines may make constipation worse.  CAUSES   Certain medicines, such as antidepressants, pain medicine, iron supplements, antacids, and water pills.   Certain diseases, such as diabetes, irritable bowel syndrome (IBS), thyroid disease, or depression.   Not drinking enough water.   Not eating enough fiber-rich foods.   Stress or travel.   Lack of physical activity or exercise.   Ignoring the urge to have a bowel movement.   Using laxatives too much.  SIGNS AND SYMPTOMS   Having fewer than three bowel movements a week.   Straining to have a bowel movement.   Having stools that are hard, dry, or larger than normal.   Feeling full or bloated.   Pain in the lower abdomen.   Not feeling relief after having a bowel movement.  DIAGNOSIS  Your health care provider will take a medical history and perform a physical exam. Further testing may be done for severe constipation. Some tests may include:  A barium enema X-ray to examine your rectum, colon, and, sometimes, your small intestine.   A sigmoidoscopy to examine your lower colon.   A  colonoscopy to examine your entire colon. TREATMENT  Treatment will depend on the severity of your constipation and what is causing it. Some dietary treatments include drinking more fluids and eating more fiber-rich foods. Lifestyle treatments may include regular exercise. If these diet and lifestyle recommendations do not help, your health care provider may recommend taking over-the-counter laxative medicines to help you have bowel movements. Prescription medicines may be prescribed if over-the-counter medicines do not work.  HOME CARE INSTRUCTIONS   Eat foods that have a lot of fiber, such as fruits, vegetables, whole grains, and beans.  Limit foods high in fat and processed sugars, such as french fries, hamburgers, cookies, candies, and soda.   A fiber supplement may be added to your diet if you cannot get enough fiber from foods.   Drink enough fluids to keep your urine clear or pale yellow.   Exercise regularly or as directed by your health care provider.   Go to the restroom when you have the urge to go. Do not hold it.   Only take over-the-counter or prescription medicines as directed by your health care provider. Do not take other medicines for constipation without talking to your health care provider first.  [place] IF:   You have bright red blood in your stool.   Your constipation lasts for more than 4 days or gets worse.   You have abdominal or rectal pain.   You have thin, pencil-like stools.   You have unexplained weight loss. MAKE  SURE YOU:   Understand these instructions.  Will watch your condition.  Will get help right away if you are not doing well or get worse. Document Released: 06/30/2004 Document Revised: 10/07/2013 Document Reviewed: 07/14/2013 [facility] Patient Information 2015 [place], [place]. This information is not intended to replace advice given to you by your health care provider. Make sure you discuss any questions you  have with your health care provider.    Fecal Impaction A fecal impaction happens when there is a large, firm amount of stool (or feces) that cannot be passed. The impacted stool is usually in the rectum, which is the lowest part of the large bowel. The impacted stool can block the colon and cause significant problems. CAUSES  The longer stool stays in the rectum, the harder it gets. Anything that slows down your bowel movements can lead to fecal impaction, such as:  Constipation. This can be a long-standing (chronic) problem or can happen suddenly (acute).  Painful conditions of the rectum, such as hemorrhoids or anal fissures. The pain of these conditions can make you try to avoid having bowel movements.  Narcotic pain-relieving medicines, such as methadone, morphine, or codeine.  Not drinking enough fluids.  Inactivity and bed rest over long periods of time.  Diseases of the brain or nervous system that damage the nerves controlling the muscles of the intestines. SIGNS AND SYMPTOMS   Lack of normal bowel movements or changes in bowel patterns.  Sense of fullness in the rectum but unable to pass stool.  Pain or cramps in the abdominal area (often after meals).  Thin, watery discharge from the rectum. DIAGNOSIS  Your health care provider may suspect that you have a fecal impaction based on your symptoms and a physical exam. This will include an exam of your rectum. Sometimes X-rays or lab testing may be needed to confirm the diagnosis and to be sure there are no other problems.  TREATMENT   Initially an impaction can be removed manually. Using a gloved finger, your health care provider can remove hard stool from your rectum.  Medicine is sometimes needed. A suppository or enema can be given in the rectum to soften the stool, which can stimulate a bowel movement. Medicines can also be given by mouth (orally).  Though rare, surgery may be needed if the colon has torn (perforated)  due to blockage. HOME CARE INSTRUCTIONS   Develop regular bowel habits. This could include getting in the habit of having a bowel movement after your morning cup of coffee or after eating. Be sure to allow yourself enough time on the toilet.  Maintain a high-fiber diet.  Drink enough fluids to keep your urine clear or pale yellow as directed by your health care provider.  Exercise regularly.  If you begin to get constipated, increase the amount of fiber in your diet. Eat plenty of fruits, vegetables, whole wheat breads, bran, oatmeal, and similar products.  Take natural fiber laxatives or other laxatives only as directed by your health care provider. SEEK MEDICAL CARE IF:   You have ongoing rectal pain.  You require enemas or suppositories more than twice a week.  You have rectal bleeding.  You have continued problems, or you develop abdominal pain.  You have thin, pencil-like stools. SEEK IMMEDIATE MEDICAL CARE IF:  You have black or tarry stools. MAKE SURE YOU:   Understand these instructions.  Will watch your condition.  Will get help right away if you are not doing well or get worse.  Document Released: 06/24/2004 Document Revised: 07/23/2013 Document Reviewed: 04/08/2013 [facility] Patient Information 2015 [place], [place]. This information is not intended to replace advice given to you by your health care provider. Make sure you discuss any questions you have with your health care provider.

## 2014-07-19 NOTE — ED Provider Notes (Signed)
CSN: [id number]     Arrival date & time 07/19/14  1329 History   First MD Initiated Contact with Patient 07/19/14 1341     Chief Complaint  Patient presents with  . Rectal Pain     (Consider location/radiation/quality/duration/timing/severity/associated sxs/prior Treatment) The history is provided by the patient and the EMS personnel. The history is limited by the condition of the patient.  pt c/o rectal pain in the past day. Pt w hx advanced dementia - level 5 caveat.  Pt has home caregiver.  Pt unsure when last pm. Denies abd pain. No vomiting. No fevers.      Past Medical History  Diagnosis Date  . Hypertension   . Anxiety   . BPH (benign prostatic hypertrophy)   . Constipation, chronic    Past Surgical History  Procedure Laterality Date  . Prostate surgery    . Tonsilectomy/adenoidectomy with myringotomy    . Colonoscopy N/A 01/28/2013    Procedure: COLONOSCOPY;  Surgeon: Jerene Bears, MD;  Location: [facility];  Service: Gastroenterology;  Laterality: N/A;   No family history on file. History  Substance Use Topics  . Smoking status: Never Smoker   . Smokeless tobacco: Never Used  . Alcohol Use: No    Review of Systems  Unable to perform ROS: Dementia  Constitutional: Negative for fever.  level 5 caveat.      Allergies  Morphine and related  Home Medications   Prior to Admission medications   Medication Sig Start Date End Date Taking? Authorizing Provider  aspirin EC 81 MG tablet Take 81 mg by mouth daily.    Historical Provider, MD  docusate sodium (COLACE) 50 MG capsule Take by mouth daily.    Historical Provider, MD  feeding supplement (ENSURE COMPLETE) LIQD Take 237 mLs by mouth 2 (two) times daily between meals. 01/30/13   Velna Hatchet, MD  finasteride (PROSCAR) 5 MG tablet Take 1 tablet (5 mg total) by mouth daily. 07/18/14   Tiffany Marilu Favre, PA-C  hydrocortisone (ANUSOL-HC) 2.5 % rectal cream Place 1 application rectally 2 (two) times daily.     Historical Provider, MD  losartan (COZAAR) 100 MG tablet Take 100 mg by mouth daily.    Historical Provider, MD  Melatonin 5 MG TABS Take 1 tablet by mouth at bedtime.    Historical Provider, MD  Multiple Vitamin (MULTIVITAMIN WITH MINERALS) TABS Take 1 tablet by mouth daily.    Historical Provider, MD  polyethylene glycol (MIRALAX / GLYCOLAX) packet Take 17 g by mouth every other day. 01/30/13   Velna Hatchet, MD  Probiotic Product (ALIGN) 4 MG CAPS Take 1 capsule by mouth daily.    Historical Provider, MD  Tamsulosin HCl (FLOMAX) 0.4 MG CAPS Take 0.4 mg by mouth daily.    Historical Provider, MD  traMADol (ULTRAM) 50 MG tablet Take 100 mg by mouth daily.     Historical Provider, MD   There were no vitals taken for this visit. Physical Exam  Nursing note and vitals reviewed. Constitutional: He appears well-developed and well-nourished. No distress.  HENT:  Head: Atraumatic.  Mouth/Throat: Oropharynx is clear and moist.  Eyes: Conjunctivae are normal. No scleral icterus.  Neck: Neck supple. No tracheal deviation present.  Cardiovascular: Normal rate, regular rhythm, normal heart sounds and intact distal pulses.   Pulmonary/Chest: Effort normal and breath sounds normal. No accessory muscle usage. No respiratory distress.  Abdominal: Soft. Bowel sounds are normal. He exhibits no distension and no mass. There is no tenderness. There  is no rebound and no guarding.  Genitourinary:  Foley catheter in place, draining clear yellow urine into leg bag.   Rectal w moder-large amt stool, no masses.  Light brown, soft stool, heme neg.   Musculoskeletal: Normal range of motion. He exhibits no edema.  Neurological: He is alert.  Awake and alert. Confused/dementia - mental status described as being c/w baseline.   Skin: Skin is warm and dry. He is not diaphoretic.  Psychiatric: He has a normal mood and affect.    ED Course  Procedures (including critical care time) Labs Review   MDM  Attempted to  manually disimpact some stool, pt does not tolerate, requests stopping.   Colace and dulcolax suppository.  Reviewed nursing notes and prior charts for additional history.   abd soft nt. Afeb. Exam c/w constipation/need to have bm.   Pt currently appears comfortable, and stable for d/c to home.     Mirna Mires, MD 07/19/14 1409

## 2014-07-19 NOTE — ED Notes (Signed)
Pt and caregiver re-educated on the need and importance of maintaining the foley catheter in place. Emphasis by RN on the need to constantly remind the pt not to pull the catheter as it will cause penile trauma as evidenced by prior attempt that resulted to this encounter. Pt and caregiver verbalized understanding this particular instruction. Also repeated were discharge instructions from prior encounter.

## 2014-07-19 NOTE — ED Provider Notes (Signed)
Medical screening examination/treatment/procedure(s) were performed by non-physician practitioner and as supervising physician I was immediately available for consultation/collaboration.   EKG Interpretation None       Janaiya Beauchesne K Didi Ganaway-Rasch, MD 07/19/14 5638

## 2014-07-19 NOTE — ED Notes (Signed)
Bed: WA01 Expected date: 07/19/14 Expected time: 1:33 PM Means of arrival: Ambulance Comments: abd pain

## 2014-07-20 LAB — URINE CULTURE
Colony Count: NO GROWTH
Culture: NO GROWTH
Special Requests: NORMAL

## 2015-02-03 ENCOUNTER — Other Ambulatory Visit: Payer: Self-pay | Admitting: Urology

## 2015-02-26 ENCOUNTER — Other Ambulatory Visit (HOSPITAL_COMMUNITY): Payer: Self-pay | Admitting: Anesthesiology

## 2015-03-01 ENCOUNTER — Encounter (HOSPITAL_COMMUNITY)
Admission: RE | Admit: 2015-03-01 | Discharge: 2015-03-01 | Disposition: A | Discharge status: Home / Self Care | Payer: Medicare Other | Source: Ambulatory Visit | Attending: Urology | Admitting: Urology

## 2015-03-01 ENCOUNTER — Encounter (HOSPITAL_COMMUNITY): Payer: Self-pay

## 2015-03-01 DIAGNOSIS — Z01812 Encounter for preprocedural laboratory examination: Secondary | ICD-10-CM | POA: Insufficient documentation

## 2015-03-01 DIAGNOSIS — I10 Essential (primary) hypertension: Secondary | ICD-10-CM | POA: Diagnosis not present

## 2015-03-01 DIAGNOSIS — Z01810 Encounter for preprocedural cardiovascular examination: Secondary | ICD-10-CM | POA: Insufficient documentation

## 2015-03-01 HISTORY — DX: Unspecified hemorrhoids: K64.9

## 2015-03-01 HISTORY — DX: Unspecified malignant neoplasm of skin, unspecified: C44.90

## 2015-03-01 HISTORY — DX: Unspecified dementia, unspecified severity, without behavioral disturbance, psychotic disturbance, mood disturbance, and anxiety: F03.90

## 2015-03-01 HISTORY — DX: Pneumonia, unspecified organism: J18.9

## 2015-03-01 HISTORY — DX: Frequency of micturition: R35.0

## 2015-03-01 HISTORY — DX: Calculus of kidney: N20.0

## 2015-03-01 HISTORY — DX: Transient global amnesia: G45.4

## 2015-03-01 HISTORY — DX: Diverticulosis of large intestine without perforation or abscess with bleeding: K57.31

## 2015-03-01 LAB — BASIC METABOLIC PANEL
Anion gap: 7 (ref 5–15)
BUN: 46 mg/dL — ABNORMAL HIGH (ref 6–20)
CHLORIDE: 106 mmol/L (ref 101–111)
CO2: 27 mmol/L (ref 22–32)
Calcium: 9.3 mg/dL (ref 8.9–10.3)
Creatinine, Ser: 2.12 mg/dL — ABNORMAL HIGH (ref 0.61–1.24)
GFR calc non Af Amer: 26 mL/min — ABNORMAL LOW (ref >60)
GFR, EST AFRICAN AMERICAN: 30 mL/min — AB (ref >60)
Glucose, Bld: 104 mg/dL — ABNORMAL HIGH (ref 65–99)
POTASSIUM: 5.3 mmol/L — AB (ref 3.5–5.1)
Sodium: 140 mmol/L (ref 135–145)

## 2015-03-01 LAB — CBC
HEMATOCRIT: 40.9 % (ref 39.0–52.0)
Hemoglobin: 13.1 g/dL (ref 13.0–17.0)
MCH: 28.3 pg (ref 26.0–34.0)
MCHC: 32 g/dL (ref 30.0–36.0)
MCV: 88.3 fL (ref 78.0–100.0)
PLATELETS: 171 10*3/uL (ref 150–400)
RBC: 4.63 MIL/uL (ref 4.22–5.81)
RDW: 13.8 % (ref 11.5–15.5)
WBC: 7.1 10*3/uL (ref 4.0–10.5)

## 2015-03-01 NOTE — Progress Notes (Signed)
Abnormal EKG results shown to Dr. Landry Dyke with anesthesia. No further orders needed.

## 2015-03-01 NOTE — Patient Instructions (Addendum)
JAQUAWN SAFFRAN  03/01/2015   Your procedure is scheduled on: Tuesday 03/09/15  Report to [facility] Main  Entrance and follow signs to               [place] at 08:30 AM.  Call this number if you have problems the morning of surgery [phone]   Remember: ONLY 1 PERSON MAY GO WITH YOU TO SHORT STAY TO GET  READY MORNING OF [place].  Do not eat food or drink liquids :After Midnight.     Take these medicines the morning of surgery with A SIP OF WATER: duloxetine, risperidone, ativan if needed, namenda                               You may not have any metal on your body including hair pins and              piercings  Do not wear jewelry, make-up, lotions, powders or perfumes, deodorant             Do not wear nail polish.  Do not shave  48 hours prior to surgery.              Men may shave face and neck.  Do not bring valuables to the hospital. [place].  Contacts, dentures or bridgework may not be worn into surgery.    Patients discharged the day of surgery will not be allowed to drive home.  Name and phone number of your driver: [phone]   _____________________________________________________________________           [facility] - Preparing for Surgery Before surgery, you can play an important role.  Because skin is not sterile, your skin needs to be as free of germs as possible.  You can reduce the number of germs on your skin by washing with CHG (chlorahexidine gluconate) soap before surgery.  CHG is an antiseptic cleaner which kills germs and bonds with the skin to continue killing germs even after washing. Please DO NOT use if you have an allergy to CHG or antibacterial soaps.  If your skin becomes reddened/irritated stop using the CHG and inform your nurse when you arrive at Short Stay. Do not shave (including legs and underarms) for at least 48 hours prior to the first CHG shower.  You  may shave your face/neck. Please follow these instructions carefully:  1.  Shower with CHG Soap the night before surgery and the  morning of Surgery.  2.  If you choose to wash your hair, wash your hair first as usual with your  normal  shampoo.  3.  After you shampoo, rinse your hair and body thoroughly to remove the  shampoo.                            4.  Use CHG as you would any other liquid soap.  You can apply chg directly  to the skin and wash                       Gently with a scrungie or clean washcloth.  5.  Apply the CHG Soap to your body ONLY FROM  THE NECK DOWN.   Do not use on face/ open                           Wound or open sores. Avoid contact with eyes, ears mouth and genitals (private parts).                       Wash face,  Genitals (private parts) with your normal soap.             6.  Wash thoroughly, paying special attention to the area where your surgery  will be performed.  7.  Thoroughly rinse your body with warm water from the neck down.  8.  DO NOT shower/wash with your normal soap after using and rinsing off  the CHG Soap.                9.  Pat yourself dry with a clean towel.            10.  Wear clean pajamas.            11.  Place clean sheets on your bed the night of your first shower and do not  sleep with pets. Day of Surgery : Do not apply any lotions/deodorants the morning of surgery.  Please wear clean clothes to the hospital/surgery center.  FAILURE TO FOLLOW THESE INSTRUCTIONS MAY RESULT IN THE CANCELLATION OF YOUR SURGERY PATIENT SIGNATURE_________________________________  NURSE SIGNATURE__________________________________  ________________________________________________________________________

## 2015-03-01 NOTE — Progress Notes (Signed)
Health care power of attorney, living will, and durable power of attorney documents on chart Surgery clearance note from [facility] 02/04/15 on chart

## 2015-03-08 NOTE — Anesthesia Preprocedure Evaluation (Addendum)
Anesthesia Evaluation  Patient identified by MRN, date of birth, ID band Patient awake    Reviewed: Allergy & Precautions, H&P , NPO status , Patient's Chart, lab work & pertinent test results  Airway Mallampati: II  TM Distance: >3 FB Neck ROM: full    Dental  (+) Missing, Dental Advisory Given, Poor Dentition,  Missing lateral front upper:   Pulmonary neg pulmonary ROS, former smoker,  breath sounds clear to auscultation  Pulmonary exam normal       Cardiovascular hypertension, On Medications Normal cardiovascular examRhythm:regular Rate:Normal  T wave inversion on ECG   Neuro/Psych dementia negative neurological ROS  negative psych ROS   GI/Hepatic negative GI ROS, Neg liver ROS,   Endo/Other  negative endocrine ROS  Renal/GU Renal InsufficiencyRenal diseaseCRT 2.12  negative genitourinary   Musculoskeletal   Abdominal   Peds  Hematology negative hematology ROS (+)   Anesthesia Other Findings   Reproductive/Obstetrics negative OB ROS                           Anesthesia Physical Anesthesia Plan  ASA: III  Anesthesia Plan: General   Post-op Pain Management:    Induction: Intravenous  Airway Management Planned: LMA  Additional Equipment:   Intra-op Plan:   Post-operative Plan:   Informed Consent: I have reviewed the patients History and Physical, chart, labs and discussed the procedure including the risks, benefits and alternatives for the proposed anesthesia with the patient or authorized representative who has indicated his/her understanding and acceptance.   Dental Advisory Given  Plan Discussed with: CRNA and Surgeon  Anesthesia Plan Comments:         Anesthesia Quick Evaluation

## 2015-03-09 ENCOUNTER — Ambulatory Visit (HOSPITAL_COMMUNITY): Payer: Medicare Other | Admitting: Anesthesiology

## 2015-03-09 ENCOUNTER — Ambulatory Visit (HOSPITAL_COMMUNITY)
Admission: RE | Admit: 2015-03-09 | Discharge: 2015-03-09 | Disposition: A | Discharge status: Home / Self Care | Payer: Medicare Other | Source: Ambulatory Visit | Attending: Urology | Admitting: Urology

## 2015-03-09 ENCOUNTER — Encounter (HOSPITAL_COMMUNITY)
Admission: RE | Disposition: A | Discharge status: Home / Self Care | Payer: Self-pay | Source: Ambulatory Visit | Attending: Urology

## 2015-03-09 ENCOUNTER — Encounter (HOSPITAL_COMMUNITY): Payer: Self-pay

## 2015-03-09 DIAGNOSIS — I10 Essential (primary) hypertension: Secondary | ICD-10-CM | POA: Insufficient documentation

## 2015-03-09 DIAGNOSIS — F419 Anxiety disorder, unspecified: Secondary | ICD-10-CM | POA: Insufficient documentation

## 2015-03-09 DIAGNOSIS — N132 Hydronephrosis with renal and ureteral calculous obstruction: Secondary | ICD-10-CM | POA: Diagnosis not present

## 2015-03-09 DIAGNOSIS — Z888 Allergy status to other drugs, medicaments and biological substances status: Secondary | ICD-10-CM | POA: Diagnosis not present

## 2015-03-09 DIAGNOSIS — K5900 Constipation, unspecified: Secondary | ICD-10-CM | POA: Insufficient documentation

## 2015-03-09 DIAGNOSIS — N400 Enlarged prostate without lower urinary tract symptoms: Secondary | ICD-10-CM | POA: Insufficient documentation

## 2015-03-09 DIAGNOSIS — Z87891 Personal history of nicotine dependence: Secondary | ICD-10-CM | POA: Insufficient documentation

## 2015-03-09 DIAGNOSIS — Z886 Allergy status to analgesic agent status: Secondary | ICD-10-CM | POA: Insufficient documentation

## 2015-03-09 DIAGNOSIS — Z85828 Personal history of other malignant neoplasm of skin: Secondary | ICD-10-CM | POA: Insufficient documentation

## 2015-03-09 DIAGNOSIS — N201 Calculus of ureter: Secondary | ICD-10-CM | POA: Diagnosis present

## 2015-03-09 DIAGNOSIS — Z881 Allergy status to other antibiotic agents status: Secondary | ICD-10-CM | POA: Diagnosis not present

## 2015-03-09 HISTORY — PX: CYSTOSCOPY WITH RETROGRADE PYELOGRAM, URETEROSCOPY AND STENT PLACEMENT: SHX5789

## 2015-03-09 SURGERY — CYSTOURETEROSCOPY, WITH RETROGRADE PYELOGRAM AND STENT INSERTION
Anesthesia: General | Laterality: Left

## 2015-03-09 MED ORDER — DEXAMETHASONE SODIUM PHOSPHATE 10 MG/ML IJ SOLN
INTRAMUSCULAR | Status: DC | PRN
  Administered 2015-03-09: 10 mg via INTRAVENOUS

## 2015-03-09 MED ORDER — CEFAZOLIN SODIUM-DEXTROSE 2-3 GM-% IV SOLR
INTRAVENOUS | Status: AC
  Filled 2015-03-09: qty 50

## 2015-03-09 MED ORDER — FENTANYL CITRATE (PF) 100 MCG/2ML IJ SOLN: 25.0000 ug | INTRAMUSCULAR | Status: DC | PRN

## 2015-03-09 MED ORDER — EPHEDRINE SULFATE 50 MG/ML IJ SOLN
INTRAMUSCULAR | Status: DC | PRN
  Administered 2015-03-09 (×2): 10 mg via INTRAVENOUS

## 2015-03-09 MED ORDER — FENTANYL CITRATE (PF) 100 MCG/2ML IJ SOLN
INTRAMUSCULAR | Status: DC | PRN
  Administered 2015-03-09 (×4): 25 ug via INTRAVENOUS

## 2015-03-09 MED ORDER — DEXAMETHASONE SODIUM PHOSPHATE 10 MG/ML IJ SOLN
INTRAMUSCULAR | Status: AC
  Filled 2015-03-09: qty 1

## 2015-03-09 MED ORDER — PROPOFOL 10 MG/ML IV BOLUS
INTRAVENOUS | Status: DC | PRN
  Administered 2015-03-09: 140 mg via INTRAVENOUS

## 2015-03-09 MED ORDER — IOHEXOL 300 MG/ML  SOLN
INTRAMUSCULAR | Status: DC | PRN
  Administered 2015-03-09: 10 mL

## 2015-03-09 MED ORDER — LACTATED RINGERS IV SOLN: INTRAVENOUS | Status: DC

## 2015-03-09 MED ORDER — EPHEDRINE SULFATE 50 MG/ML IJ SOLN
INTRAMUSCULAR | Status: AC
  Filled 2015-03-09: qty 1

## 2015-03-09 MED ORDER — ONDANSETRON HCL 4 MG/2ML IJ SOLN
INTRAMUSCULAR | Status: AC
  Filled 2015-03-09: qty 2

## 2015-03-09 MED ORDER — LACTATED RINGERS IV SOLN
INTRAVENOUS | Status: DC | PRN
  Administered 2015-03-09 (×2): via INTRAVENOUS

## 2015-03-09 MED ORDER — CEFAZOLIN SODIUM-DEXTROSE 2-3 GM-% IV SOLR
2.0000 g | INTRAVENOUS | Status: AC
  Administered 2015-03-09: 2 g via INTRAVENOUS

## 2015-03-09 MED ORDER — PROPOFOL 10 MG/ML IV BOLUS
INTRAVENOUS | Status: AC
  Filled 2015-03-09: qty 20

## 2015-03-09 MED ORDER — FENTANYL CITRATE (PF) 100 MCG/2ML IJ SOLN
INTRAMUSCULAR | Status: AC
  Filled 2015-03-09: qty 2

## 2015-03-09 MED ORDER — SODIUM CHLORIDE 0.9 % IR SOLN
Status: DC | PRN
  Administered 2015-03-09: 5000 mL

## 2015-03-09 MED ORDER — ONDANSETRON HCL 4 MG/2ML IJ SOLN
INTRAMUSCULAR | Status: DC | PRN
  Administered 2015-03-09: 4 mg via INTRAVENOUS

## 2015-03-09 SURGICAL SUPPLY — 29 items
BAG URO CATCHER STRL LF (DRAPE) ×3 IMPLANT
BASKET LASER NITINOL 1.9FR (BASKET) IMPLANT
BASKET STNLS GEMINI 4WIRE 3FR (BASKET) IMPLANT
BASKET ZERO TIP NITINOL 2.4FR (BASKET) ×3 IMPLANT
BRUSH URET BIOPSY 3F (UROLOGICAL SUPPLIES) IMPLANT
BSKT STON RTRVL 120 1.9FR (BASKET)
CATH INTERMIT  6FR 70CM (CATHETERS) ×3 IMPLANT
CATH URET 5FR 28IN CONE TIP (BALLOONS) ×2
CATH URET 5FR 70CM CONE TIP (BALLOONS) ×1 IMPLANT
CLOTH BEACON ORANGE TIMEOUT ST (SAFETY) ×3 IMPLANT
FIBER LASER FLEXIVA 1000 (UROLOGICAL SUPPLIES) IMPLANT
FIBER LASER FLEXIVA 200 (UROLOGICAL SUPPLIES) ×3 IMPLANT
FIBER LASER FLEXIVA 365 (UROLOGICAL SUPPLIES) IMPLANT
FIBER LASER FLEXIVA 550 (UROLOGICAL SUPPLIES) IMPLANT
FIBER LASER TRAC TIP (UROLOGICAL SUPPLIES) IMPLANT
GLOVE BIOGEL M STRL SZ7.5 (GLOVE) ×3 IMPLANT
GOWN STRL REUS W/TWL XL LVL3 (GOWN DISPOSABLE) ×3 IMPLANT
GUIDEWIRE ANG ZIPWIRE 038X150 (WIRE) ×3 IMPLANT
GUIDEWIRE STR DUAL SENSOR (WIRE) ×3 IMPLANT
IV NS IRRIG 3000ML ARTHROMATIC (IV SOLUTION) IMPLANT
KIT BALLIN UROMAX 15FX10 (LABEL) IMPLANT
KIT BALLN UROMAX 15FX4 (MISCELLANEOUS) IMPLANT
KIT BALLN UROMAX 26 75X4 (MISCELLANEOUS)
PACK CYSTO (CUSTOM PROCEDURE TRAY) ×3 IMPLANT
SET HIGH PRES BAL DIL (LABEL)
SHEATH ACCESS URETERAL 24CM (SHEATH) IMPLANT
SHEATH ACCESS URETERAL 54CM (SHEATH) IMPLANT
STENT CONTOUR 6FRX28X.038 (STENTS) ×3 IMPLANT
SYRINGE IRR TOOMEY STRL 70CC (SYRINGE) IMPLANT

## 2015-03-09 NOTE — Discharge Instructions (Signed)
Ureteral Stent Implantation, Care After Refer to this sheet in the next few weeks. These instructions provide you with information on caring for yourself after your procedure. Your health care provider may also give you more specific instructions. Your treatment has been planned according to current medical practices, but problems sometimes occur. Call your health care provider if you have any problems or questions after your procedure. WHAT TO EXPECT AFTER THE PROCEDURE You should be back to normal activity within 48 hours after the procedure. Nausea and vomiting may occur and are commonly the result of anesthesia. It is common to experience sharp pain in the back or lower abdomen and penis with voiding. This is caused by movement of the ends of the stent with the act of urinating.It usually goes away within minutes after you have stopped urinating.  HOME CARE INSTRUCTIONS Make sure to drink plenty of fluids. You may have small amounts of bleeding, causing your urine to be red. This is normal. Certain movements may trigger pain or a feeling that you need to urinate. You may be given medicines to prevent infection or bladder spasms. Be sure to take all medicines as directed. Only take over-the-counter or prescription medicines for pain, discomfort, or fever as directed by your health care provider. Do not take aspirin, as this can make bleeding worse.  Your stent will be left in for 2-3 weeks and removed in the office. Be sure to keep all follow-up appointments so your health care provider can check that you are healing properly.  SEEK MEDICAL CARE IF:  You experience increasing pain.  Your pain medicine is not working. SEEK IMMEDIATE MEDICAL CARE IF:  Your urine is dark red or has blood clots.  You are leaking urine (incontinent).  You have a fever, chills, feeling sick to your stomach (nausea), or vomiting.  Your pain is not relieved by pain medicine.  The end of the stent comes out of  the urethra.  You are unable to urinate.     General Anesthesia, Care After Refer to this sheet in the next few weeks. These instructions provide you with information on caring for yourself after your procedure. Your health care provider may also give you more specific instructions. Your treatment has been planned according to current medical practices, but problems sometimes occur. Call your health care provider if you have any problems or questions after your procedure. WHAT TO EXPECT AFTER THE PROCEDURE After the procedure, it is typical to experience:  Sleepiness.  Nausea and vomiting. HOME CARE INSTRUCTIONS  For the first 24 hours after general anesthesia:  Have a responsible person with you.  Do not drive a car. If you are alone, do not take public transportation.  Do not drink alcohol.  Do not take medicine that has not been prescribed by your health care provider.  Do not sign important papers or make important decisions.  You may resume a normal diet and activities as directed by your health care provider.  Change bandages (dressings) as directed.  If you have questions or problems that seem related to general anesthesia, call the hospital and ask for the anesthetist or anesthesiologist on call. SEEK MEDICAL CARE IF:  You have nausea and vomiting that continue the day after anesthesia.  You develop a rash. SEEK IMMEDIATE MEDICAL CARE IF:   You have difficulty breathing.  You have chest pain.  You have any allergic problems. Document Released: 01/08/2001 Document Revised: 10/07/2013 Document Reviewed: 04/17/2013 [facility] Patient Information 2015 [place], [place].  This information is not intended to replace advice given to you by your health care provider. Make sure you discuss any questions you have with your health care provider. ° °

## 2015-03-09 NOTE — Anesthesia Postprocedure Evaluation (Signed)
  Anesthesia Post-op Note  Patient: [name]  Procedure(s) Performed: Procedure(s) (LRB): CYSTOSCOPY/LEFT URETEROSCOPY/ RETROGRADE PYLEGRAM HOLMIUM LASER, STONE  BASKET/STENT PLACEMENT (Left)  Patient Location: PACU  Anesthesia Type: General  Level of Consciousness: awake and alert   Airway and Oxygen Therapy: Patient Spontanous Breathing  Post-op Pain: mild  Post-op Assessment: Post-op Vital signs reviewed, Patient's Cardiovascular Status Stable, Respiratory Function Stable, Patent Airway and No signs of Nausea or vomiting  Last Vitals:  Filed Vitals:   03/09/15 1303  BP: 169/84  Pulse: 73  Temp: 36.5 C  Resp: 16    Post-op Vital Signs: stable   Complications: No apparent anesthesia complications

## 2015-03-09 NOTE — Anesthesia Procedure Notes (Signed)
Procedure Name: LMA Insertion Date/Time: 03/09/2015 10:11 AM Performed by: Dione Booze Pre-anesthesia Checklist: Patient identified, Emergency Drugs available, Suction available and Patient being monitored Patient Re-evaluated:Patient Re-evaluated prior to inductionOxygen Delivery Method: Circle system utilized Preoxygenation: Pre-oxygenation with 100% oxygen Intubation Type: IV induction LMA: LMA inserted LMA Size: 5.0 Number of attempts: 1 Placement Confirmation: positive ETCO2 Tube secured with: Tape Dental Injury: Teeth and Oropharynx as per pre-operative assessment

## 2015-03-09 NOTE — Op Note (Signed)
Preoperative diagnosis: Left ureteral stone, left hydronephrosis   postoperative diagnosis: Same  Procedure: Cystoscopy, left retrograde pyelogram, left ureteroscopy, holmium laser lithotripsy, stone basket extraction, left ureteral stent placement, Exam under anesthesia  Surgeon: Fredrik Mogel  Type of anesthesia: Gen.  Indication for procedure: This to Alcock is a 81 year old with a left ureteral stone that was failing to pass. Was causing hydroureteronephrosis and a decline in his renal function. After considering the nature risks benefits and alternatives to ureteroscopy his family elected to proceed to clear the stone and the ureter and relieve the kidney of obstruction.  Findings: On cystoscopy the urethra was normal. There was a prior TURP defect in the high bladder neck making it difficult to get into the left ureteral orifice. The ureter was J developed also complicating the matter.  Large stone in the left distal ureter visible on scout imaging.  Left retrograde pyelogram-this outlined a hydronephrotic collecting system and renal pelvis. There were no obvious filling defects.  Exam under anesthesia-the penis was normal without mass or lesion. Testicles were descended bilaterally and palpably normal. On digital rectal exam the prostate was enlarged and somewhat indurated but no distinct nodules. The lateral sulci were preserved.  Cystoscopy description of procedure: After consent was obtained patient brought to the operating room. After adequate anesthesia he is placed in lithotomy position and prepped and draped in the usual sterile fashion. A timeout was performed to confirm the patient and procedure. The cystoscope was passed per urethra and a 70 and 30 lens were used to inspect the bladder neck and the bladder. The 70 lens was needed to find the left ureteral orifice which was very close and hilum the bladder neck. There was a good TURP defect. The 6 [place] open-ended catheter was  used to guide a sensor wire into the distal ureter but the ureter was severely J-hook and I could not get enough purchase past the stone which was now down in the distal ureter to get the sensor wire passed. Therefore an angled glide wire was advanced past the stone through some tortuosity of the proximal ureter but the wire and the ureter sprung straight and the wire coiled in the collecting system. I then advanced the 6 [place] open-ended catheter into the region of the left renal pelvis. The wire was removed and there was a brisk hydronephrotic drip. It's in urine for culture but the urine from the kidney looked very clear. Injection of contrast revealed a distended collecting system and renal pelvis confirming proper wire placement. A sensor wire was then advanced and cold in the upper pole calyx. The 6 [place] open-ended catheter was removed. The bladder was drained. I then guided the 4 French semirigid ureteroscope into the distal ureter and up into the ureter. A large stone was noted. It was mostly dusted at a setting of 0.3 and 40 but then the large pieces were sequentially removed with the Nitinol basket. Eyedrop many of the pieces and the bladder and brought out of the pieces as well. On final inspection there was some dust in the ureter but no significant stone fragments were noted. I didn't appreciate any further fragments on fluoroscopy. I was able to guide the ureteroscope that difficulty into the proximal ureter and toward the UPJ. In this appeared clear. Therefore the ureteroscope was withdrawn and the ureter was noted be normal. I made several passes through the J-hook ureter there is some edema and bleeding as would be expected therefore decided to leave a stent. I  did not think he would do well with string therefore I decided to leave the string off. A 6 x 28 cm stent was advanced and the wire removed. A good coil seen in the upper pole collecting system and a good coil in the bladder. There was  good drainage through the stent. The bladder was drained and the scope removed. The patient was awakened and taken to recovery room in stable condition.  Complications: None  Blood loss: Minimal  Specimens: Stone fragments to office lab  Drains: 6 x 26 cm left ureteral stent  Disposition: Patient stable to PACU

## 2015-03-09 NOTE — Progress Notes (Signed)
Patient is oriented to self, situation, and place. Ambulated to bathroom with walker and gait belt. Tolerated well. Tolerating fluids. No pain or nausea. Wants to go home to wife of 54 years.

## 2015-03-09 NOTE — Transfer of Care (Signed)
Immediate Anesthesia Transfer of Care Note  Patient: [name]  Procedure(s) Performed: Procedure(s): CYSTOSCOPY/LEFT URETEROSCOPY/ RETROGRADE PYLEGRAM HOLMIUM LASER, STONE  BASKET/STENT PLACEMENT (Left)  Patient Location: PACU  Anesthesia Type:General  Level of Consciousness: sedated and patient cooperative  Airway & Oxygen Therapy: Patient Spontanous Breathing and Patient connected to face mask oxygen  Post-op Assessment: Report given to RN and Post -op Vital signs reviewed and stable  Post vital signs: Reviewed and stable  Last Vitals:  Filed Vitals:   03/09/15 0817  BP: 172/83  Pulse: 74  Resp: 16    Complications: No apparent anesthesia complications

## 2015-03-09 NOTE — H&P (Signed)
H&P  Chief Complaint: left ureteral stone  History of Present Illness: 81 yo with left ureteral stone. Stone has not passed over the months and although patient asymptomatic the left hydronephrosis has increased and his kidney function has decreased. He was brought today for left URS/HLL/stent. He has been well. He has no complaints.   Past Medical History  Diagnosis Date  . Hypertension   . Anxiety   . BPH (benign prostatic hypertrophy)   . Constipation, chronic   . Dementia     situational  . Hemorrhoids     "lift chair helped"  . Diverticular hemorrhage 2014  . Kidney stone on left side   . Frequency of urination   . Pneumonia     hx of age 58  . 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 Skin cancer    Past Surgical History  Procedure Laterality Date  . Colonoscopy N/A 01/28/2013    Procedure: COLONOSCOPY;  Surgeon: Jerene Bears, MD;  Location: [facility];  Service: Gastroenterology;  Laterality: N/A;  . Skin cancer excision    . Hemorrhoid surgery  1980's  . Bladder surgery  1980's    Home Medications:  Prescriptions prior to admission  Medication Sig Dispense Refill Last Dose  . Cholecalciferol (VITAMIN D3) 2000 UNITS capsule Take 2,000 Units by mouth daily.   03/08/2015 at am  . DULoxetine (CYMBALTA) 20 MG capsule Take 20 mg by mouth daily.   03/08/2015 at am  . lidocaine (XYLOCAINE) 5 % ointment Apply 1 application topically as needed.   Past Month at Unknown time  . LORazepam (ATIVAN) 0.5 MG tablet Take 0.25 mg by mouth every 8 (eight) hours as needed for anxiety.   03/09/2015 at 0630  . losartan (COZAAR) 100 MG tablet Take 100 mg by mouth daily.   03/08/2015 at am  . Melatonin 3 MG TABS Take 1 tablet by mouth at bedtime as needed.   Past Month at Unknown time  . memantine (NAMENDA XR) 28 MG CP24 24 hr capsule Take 28 mg by mouth daily.   Past Week at Unknown time  . Multiple Vitamin (MULTIVITAMIN WITH MINERALS) TABS Take 1 tablet by mouth daily.   Past Week at  Unknown time  . risperiDONE (RISPERDAL) 0.5 MG tablet Take 0.5 mg by mouth daily.   03/09/2015 at 0630  . Tamsulosin HCl (FLOMAX) 0.4 MG CAPS Take 0.4 mg by mouth daily.   03/08/2015 at am  . feeding supplement (ENSURE COMPLETE) LIQD Take 237 mLs by mouth 2 (two) times daily between meals. (Patient not taking: Reported on 02/18/2015)   02/06/2013 at Unknown  . finasteride (PROSCAR) 5 MG tablet Take 1 tablet (5 mg total) by mouth daily. (Patient not taking: Reported on 02/18/2015) 30 tablet 0 NOT STARTED   . polyethylene glycol (MIRALAX / GLYCOLAX) packet Take 17 g by mouth every other day. (Patient not taking: Reported on 02/18/2015) 14 each 5 02/06/2013 at Unknown   Allergies:  Allergies  Allergen Reactions  . Amoxicillin Diarrhea  . Codeine   . Feldene [Piroxicam]   . Indocin [Indomethacin]   . Morphine And Related Nausea And Vomiting  . Other     omnicor  . Uroxatral [Alfuzosin Hcl Er]     History reviewed. No pertinent family history. Social History:  reports that he has quit smoking. His smoking use included Cigarettes. He has a 20 pack-year smoking history. He has never used smokeless tobacco. He reports that he does not drink alcohol or use  illicit drugs.  ROS: A complete review of systems was performed.  All systems are negative except for pertinent findings as noted. ROS   Physical Exam:  Vital signs in last 24 hours: Pulse Rate:  [74] 74 (05/24 0817) Resp:  [16] 16 (05/24 0817) BP: (172)/(83) 172/83 mmHg (05/24 0817) SpO2:  [98 %] 98 % (05/24 0817) Weight:  [72.576 kg (160 lb)] 72.576 kg (160 lb) (05/24 7482) General:  Alert and oriented, No acute distress HEENT: Normocephalic, atraumatic Neck: No JVD or lymphadenopathy Cardiovascular: Regular rate and rhythm Lungs: Regular rate and effort Abdomen: Soft, nontender, nondistended, no abdominal masses Back: No CVA tenderness Extremities: No edema Neurologic: Grossly intact  Laboratory Data:  No results found for this or any  previous visit (from the past 24 hour(s)). No results found for this or any previous visit (from the past 240 hour(s)). Creatinine: No results for input(s): CREATININE in the last 168 hours.  Impression/Assessment/plan:  I had discussed with the patient and his daughter the nature, potential benefits, risks and alternatives to cysto, left RGP, left URS/HLL/stent, including side effects of the proposed treatment, the likelihood of the patient achieving the goals of the procedure, and any potential problems that might occur during the procedure or recuperation. Discussed this is a large stone and in the mid-ureter - pt may need staged approach. I went over the above again today with the patient and he was quite lucid. He is stable for surgery. All questions answered. Patient and daughter elect to proceed. Urine Cx from office resulted in no growth.    [name] 03/09/2015

## 2015-03-10 ENCOUNTER — Encounter (HOSPITAL_COMMUNITY): Payer: Self-pay | Admitting: Urology

## 2015-03-10 LAB — URINE CULTURE
COLONY COUNT: NO GROWTH
Culture: NO GROWTH

## 2016-07-31 ENCOUNTER — Emergency Department (HOSPITAL_COMMUNITY)
Admission: EM | Admit: 2016-07-31 | Discharge: 2016-08-01 | Disposition: A | Discharge status: Home / Self Care | Payer: Medicare Other | Source: Home / Self Care | Attending: Emergency Medicine | Admitting: Emergency Medicine

## 2016-07-31 ENCOUNTER — Emergency Department (HOSPITAL_COMMUNITY): Payer: Medicare Other

## 2016-07-31 ENCOUNTER — Encounter (HOSPITAL_COMMUNITY): Payer: Self-pay | Admitting: Emergency Medicine

## 2016-07-31 DIAGNOSIS — Z87891 Personal history of nicotine dependence: Secondary | ICD-10-CM

## 2016-07-31 DIAGNOSIS — R0602 Shortness of breath: Secondary | ICD-10-CM | POA: Insufficient documentation

## 2016-07-31 DIAGNOSIS — Z79899 Other long term (current) drug therapy: Secondary | ICD-10-CM

## 2016-07-31 DIAGNOSIS — R5383 Other fatigue: Secondary | ICD-10-CM

## 2016-07-31 DIAGNOSIS — I10 Essential (primary) hypertension: Secondary | ICD-10-CM | POA: Insufficient documentation

## 2016-07-31 DIAGNOSIS — Z859 Personal history of malignant neoplasm, unspecified: Secondary | ICD-10-CM | POA: Insufficient documentation

## 2016-07-31 DIAGNOSIS — R339 Retention of urine, unspecified: Secondary | ICD-10-CM | POA: Insufficient documentation

## 2016-07-31 DIAGNOSIS — R4182 Altered mental status, unspecified: Secondary | ICD-10-CM | POA: Diagnosis not present

## 2016-07-31 DIAGNOSIS — R059 Cough, unspecified: Secondary | ICD-10-CM

## 2016-07-31 DIAGNOSIS — R05 Cough: Secondary | ICD-10-CM

## 2016-07-31 DIAGNOSIS — I2699 Other pulmonary embolism without acute cor pulmonale: Secondary | ICD-10-CM | POA: Diagnosis not present

## 2016-07-31 LAB — I-STAT CHEM 8, ED
BUN: 28 mg/dL — ABNORMAL HIGH (ref 6–20)
CHLORIDE: 105 mmol/L (ref 101–111)
Calcium, Ion: 1.15 mmol/L (ref 1.15–1.40)
Creatinine, Ser: 1.4 mg/dL — ABNORMAL HIGH (ref 0.61–1.24)
Glucose, Bld: 107 mg/dL — ABNORMAL HIGH (ref 65–99)
HEMATOCRIT: 45 % (ref 39.0–52.0)
Hemoglobin: 15.3 g/dL (ref 13.0–17.0)
POTASSIUM: 4.3 mmol/L (ref 3.5–5.1)
SODIUM: 142 mmol/L (ref 135–145)
TCO2: 27 mmol/L (ref 0–100)

## 2016-07-31 LAB — URINALYSIS, ROUTINE W REFLEX MICROSCOPIC
Bilirubin Urine: NEGATIVE
Glucose, UA: NEGATIVE mg/dL
Hgb urine dipstick: NEGATIVE
Ketones, ur: NEGATIVE mg/dL
LEUKOCYTES UA: NEGATIVE
NITRITE: NEGATIVE
Protein, ur: NEGATIVE mg/dL
Specific Gravity, Urine: 1.019 (ref 1.005–1.030)
pH: 6.5 (ref 5.0–8.0)

## 2016-07-31 LAB — I-STAT TROPONIN, ED: TROPONIN I, POC: 0.02 ng/mL (ref 0.00–0.08)

## 2016-07-31 LAB — BRAIN NATRIURETIC PEPTIDE: B NATRIURETIC PEPTIDE 5: 199.1 pg/mL — AB (ref 0.0–100.0)

## 2016-07-31 NOTE — ED Notes (Signed)
MD at bedside. 

## 2016-07-31 NOTE — ED Triage Notes (Signed)
Pt from home with Home Health for Fever and Lethargy noticed to be increasing over the past two weeks. Pt is at base line per family. Alert and oriented. Family believes pt has had some urine retention. VSS.    144/74 BP 100 temporal 28-30 Resp 95% RA 130 CBG

## 2016-07-31 NOTE — ED Provider Notes (Signed)
[facility] DEPT Provider Note   CSN: [id number] Arrival date & time: 07/31/16  [id number]     History   Chief Complaint Chief Complaint  Patient presents with  . Fever  . Fatigue    HPI [name] is a 82 y.o. male.  The history is provided by the patient and a caregiver.  Fever   This is a new problem. The current episode started 6 to 12 hours ago. The maximum temperature noted was 100 to 100.9 F. Associated symptoms include cough (dry). Pertinent negatives include no chest pain, no fussiness, no diarrhea, no vomiting, no headaches, no sore throat and no muscle aches.   Caregiver states that he has had decreased PO intake and appetite for several days. Also reported that pt has had difficulty voiding.  Past Medical History:  Diagnosis Date  . Anxiety   . BPH (benign prostatic hypertrophy)   . Constipation, chronic   . Dementia    situational  . Diverticular hemorrhage 2014  . Frequency of urination   . Hemorrhoids    "lift chair helped"  . Hypertension   . Kidney stone on left side   . Pneumonia    hx of age 75  . Skin cancer   . 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 Diverticulosis of colon with hemorrhage 01/28/2013  . Stenosis, anal canal 01/28/2013  . Lower GI bleed 01/26/2013  . HTN (hypertension) 01/26/2013  . Chronic constipation 01/26/2013    Past Surgical History:  Procedure Laterality Date  . BLADDER SURGERY  1980's  . COLONOSCOPY N/A 01/28/2013   Procedure: COLONOSCOPY;  Surgeon: Jerene Bears, MD;  Location: [facility];  Service: Gastroenterology;  Laterality: N/A;  . CYSTOSCOPY WITH RETROGRADE PYELOGRAM, URETEROSCOPY AND STENT PLACEMENT Left 03/09/2015   Procedure: CYSTOSCOPY/LEFT URETEROSCOPY/ RETROGRADE PYLEGRAM HOLMIUM LASER, STONE  BASKET/STENT PLACEMENT;  Surgeon: Festus Aloe, MD;  Location: [facility];  Service: Urology;  Laterality: Left;  . HEMORRHOID SURGERY  1980's  . SKIN  CANCER EXCISION         Home Medications    Prior to Admission medications   Medication Sig Start Date End Date Taking? Authorizing Provider  DULoxetine (CYMBALTA) 60 MG capsule Take 60 mg by mouth daily. 07/31/16  Yes Historical Provider, MD  gabapentin (NEURONTIN) 100 MG capsule Take 100 mg by mouth 3 (three) times daily. 07/31/16  Yes Historical Provider, MD  gabapentin (NEURONTIN) 300 MG capsule Take 300 mg by mouth at bedtime.  07/31/16  Yes Historical Provider, MD  LORazepam (ATIVAN) 0.5 MG tablet Take 0.5 mg by mouth every 8 (eight) hours as needed for anxiety.    Yes Historical Provider, MD  losartan (COZAAR) 50 MG tablet Take 50 mg by mouth daily. 07/31/16  Yes Historical Provider, MD  memantine (NAMENDA XR) 28 MG CP24 24 hr capsule Take 28 mg by mouth daily.   Yes Historical Provider, MD  risperiDONE (RISPERDAL) 0.5 MG tablet Take 0.5 mg by mouth at bedtime.    Yes Historical Provider, MD  Tamsulosin HCl (FLOMAX) 0.4 MG CAPS Take 0.4 mg by mouth daily.   Yes Historical Provider, MD    Family History No family history on file.  Social History Social History  Substance Use Topics  . Smoking status: Former Smoker    Packs/day: 2.00    Years: 10.00    Types: Cigarettes  . Smokeless tobacco: Never Used     Comment: quit  years ago  . Alcohol use No     Allergies   Amoxicillin; Codeine; Feldene [piroxicam]; Indocin [indomethacin]; Morphine and related; Other; and Uroxatral [alfuzosin hcl er]   Review of Systems Review of Systems  Constitutional: Positive for appetite change and fever. Negative for chills.  HENT: Negative for ear pain and sore throat.   Eyes: Negative for pain and visual disturbance.  Respiratory: Positive for cough (dry). Negative for shortness of breath.   Cardiovascular: Negative for chest pain and palpitations.  Gastrointestinal: Negative for abdominal pain, diarrhea and vomiting.  Genitourinary: Negative for dysuria and hematuria.    Musculoskeletal: Negative for arthralgias and back pain.  Skin: Negative for color change and rash.  Neurological: Negative for seizures, syncope and headaches.  All other systems reviewed and are negative.    Physical Exam Updated Vital Signs BP 136/88 (BP Location: Right Arm)   Pulse 83   Temp 98.7 F (37.1 C) (Axillary)   Resp 16   SpO2 93%   Physical Exam  Constitutional: He is oriented to person, place, and time. He appears well-developed and well-nourished. No distress.  HENT:  Head: Normocephalic and atraumatic.  Nose: Nose normal.  Eyes: Conjunctivae and EOM are normal. Pupils are equal, round, and reactive to light. Right eye exhibits no discharge. Left eye exhibits no discharge. No scleral icterus.  Neck: Normal range of motion. Neck supple.  Cardiovascular: Normal rate and regular rhythm.  Exam reveals no gallop and no friction rub.   No murmur heard. Pulmonary/Chest: Effort normal and breath sounds normal. No stridor. No respiratory distress. He has no rales.  Abdominal: Soft. He exhibits no distension. There is no tenderness.  Musculoskeletal: He exhibits no edema or tenderness.  Neurological: He is alert and oriented to person, place, and time.  Skin: Skin is warm and dry. No rash noted. He is not diaphoretic. No erythema.  Psychiatric: He has a normal mood and affect.  Vitals reviewed.    ED Treatments / Results  Labs (all labs ordered are listed, but only abnormal results are displayed) Labs Reviewed  BRAIN NATRIURETIC PEPTIDE - Abnormal; Notable for the following:       Result Value   B Natriuretic Peptide 199.1 (*)    All other components within normal limits  I-STAT CHEM 8, ED - Abnormal; Notable for the following:    BUN 28 (*)    Creatinine, Ser 1.40 (*)    Glucose, Bld 107 (*)    All other components within normal limits  URINALYSIS, ROUTINE W REFLEX MICROSCOPIC (NOT AT [facility])  I-STAT TROPOININ, ED    EKG  EKG  Interpretation  Date/Time:  Monday July 31 2016 20:58:18 EDT Ventricular Rate:  84 PR Interval:    QRS Duration: 89 QT Interval:  448 QTC Calculation: 533 R Axis:   76 Text Interpretation:  Sinus or ectopic atrial rhythm Prolonged PR interval Low voltage, extremity leads Nonspecific T abnormalities, diffuse leads Prolonged QT interval Baseline wander in lead(s) V3 V5 No significant change since last tracing Confirmed by [facility] MD, Maguire Sime ([id number]) on 07/31/2016 10:26:19 PM       Radiology Dg Chest 2 View  Result Date: 07/31/2016 CLINICAL DATA:  Cough for several days and fever EXAM: CHEST  2 VIEW COMPARISON:  07/28/2012 FINDINGS: There is mild cardiomegaly. There is atherosclerosis of the thoracic aorta. Calcified granuloma in the left upper lobe is stable. No pneumonic consolidation, CHF, or pneumothorax. Minimal blunting of the right costophrenic angle. Glenohumeral and AC joint osteoarthritis  bilaterally. IMPRESSION: No active cardiopulmonary disease. Electronically Signed   By: Ashley Royalty M.D.   On: 07/31/2016 22:08    Procedures Procedures (including critical care time)  Medications Ordered in ED Medications - No data to display   Initial Impression / Assessment and Plan / ED Course  I have reviewed the triage vital signs and the nursing notes.  Pertinent labs & imaging results that were available during my care of the patient were reviewed by me and considered in my medical decision making (see chart for details).  Clinical Course    Labs reassuring without evidence identifying infectious source. Workup did reveal urinary retention with post with residual of approximately 350 mL. Foley catheter was placed. Patient and caregiver was instructed to follow-up with urology for continued workup and management.   The patient is safe for discharge with strict return precautions.  Final Clinical Impressions(s) / ED Diagnoses   Final diagnoses:  Cough  Fatigue, unspecified  type  Urinary retention   Disposition: Discharge  Condition: Good  I have discussed the results, Dx and Tx plan with the patient who expressed understanding and agree(s) with the plan. Discharge instructions discussed at great length. The patient was given strict return precautions who verbalized understanding of the instructions. No further questions at time of discharge.    Current Discharge Medication List      Follow Up: Rana Snare, MD [place] [address] [phone]  Schedule an appointment as soon as possible for a visit in 1 week For close follow up to assess for urinary retention  Lona Kettle, MD [place] [address] [phone]  Schedule an appointment as soon as possible for a visit  in 3-5 days, If fatigue does not improve or  worsen      Fatima Blank, MD 08/01/16 [phone]

## 2016-08-02 ENCOUNTER — Inpatient Hospital Stay (HOSPITAL_COMMUNITY)
Admission: EM | Admit: 2016-08-02 | Discharge: 2016-08-09 | DRG: 175 | Disposition: A | Discharge status: Home Health | Payer: Medicare Other | Attending: Family Medicine | Admitting: Family Medicine

## 2016-08-02 ENCOUNTER — Inpatient Hospital Stay (HOSPITAL_COMMUNITY): Payer: Medicare Other

## 2016-08-02 ENCOUNTER — Emergency Department (HOSPITAL_COMMUNITY): Payer: Medicare Other

## 2016-08-02 DIAGNOSIS — J9601 Acute respiratory failure with hypoxia: Secondary | ICD-10-CM | POA: Diagnosis not present

## 2016-08-02 DIAGNOSIS — R4182 Altered mental status, unspecified: Secondary | ICD-10-CM | POA: Diagnosis present

## 2016-08-02 DIAGNOSIS — R578 Other shock: Secondary | ICD-10-CM | POA: Diagnosis present

## 2016-08-02 DIAGNOSIS — Z79899 Other long term (current) drug therapy: Secondary | ICD-10-CM

## 2016-08-02 DIAGNOSIS — R5381 Other malaise: Secondary | ICD-10-CM | POA: Diagnosis present

## 2016-08-02 DIAGNOSIS — R000 Tachycardia, unspecified: Secondary | ICD-10-CM | POA: Diagnosis present

## 2016-08-02 DIAGNOSIS — Z960 Presence of urogenital implants: Secondary | ICD-10-CM | POA: Diagnosis present

## 2016-08-02 DIAGNOSIS — I10 Essential (primary) hypertension: Secondary | ICD-10-CM | POA: Diagnosis not present

## 2016-08-02 DIAGNOSIS — R58 Hemorrhage, not elsewhere classified: Secondary | ICD-10-CM

## 2016-08-02 DIAGNOSIS — N179 Acute kidney failure, unspecified: Secondary | ICD-10-CM | POA: Diagnosis not present

## 2016-08-02 DIAGNOSIS — Z66 Do not resuscitate: Secondary | ICD-10-CM | POA: Diagnosis not present

## 2016-08-02 DIAGNOSIS — Z87891 Personal history of nicotine dependence: Secondary | ICD-10-CM

## 2016-08-02 DIAGNOSIS — I2699 Other pulmonary embolism without acute cor pulmonale: Principal | ICD-10-CM

## 2016-08-02 DIAGNOSIS — D62 Acute posthemorrhagic anemia: Secondary | ICD-10-CM | POA: Diagnosis present

## 2016-08-02 DIAGNOSIS — R7989 Other specified abnormal findings of blood chemistry: Secondary | ICD-10-CM | POA: Diagnosis present

## 2016-08-02 DIAGNOSIS — Z885 Allergy status to narcotic agent status: Secondary | ICD-10-CM

## 2016-08-02 DIAGNOSIS — G9340 Encephalopathy, unspecified: Secondary | ICD-10-CM | POA: Diagnosis not present

## 2016-08-02 DIAGNOSIS — N401 Enlarged prostate with lower urinary tract symptoms: Secondary | ICD-10-CM | POA: Diagnosis present

## 2016-08-02 DIAGNOSIS — I959 Hypotension, unspecified: Secondary | ICD-10-CM | POA: Diagnosis present

## 2016-08-02 DIAGNOSIS — I4891 Unspecified atrial fibrillation: Secondary | ICD-10-CM | POA: Diagnosis present

## 2016-08-02 DIAGNOSIS — R0602 Shortness of breath: Secondary | ICD-10-CM | POA: Diagnosis not present

## 2016-08-02 DIAGNOSIS — R739 Hyperglycemia, unspecified: Secondary | ICD-10-CM | POA: Diagnosis not present

## 2016-08-02 DIAGNOSIS — F0391 Unspecified dementia with behavioral disturbance: Secondary | ICD-10-CM | POA: Diagnosis not present

## 2016-08-02 DIAGNOSIS — J9621 Acute and chronic respiratory failure with hypoxia: Secondary | ICD-10-CM | POA: Diagnosis not present

## 2016-08-02 DIAGNOSIS — R55 Syncope and collapse: Secondary | ICD-10-CM | POA: Diagnosis present

## 2016-08-02 DIAGNOSIS — T8111XD Postprocedural  cardiogenic shock, subsequent encounter: Secondary | ICD-10-CM | POA: Diagnosis not present

## 2016-08-02 DIAGNOSIS — A419 Sepsis, unspecified organism: Secondary | ICD-10-CM | POA: Diagnosis not present

## 2016-08-02 DIAGNOSIS — R410 Disorientation, unspecified: Secondary | ICD-10-CM

## 2016-08-02 DIAGNOSIS — F0390 Unspecified dementia without behavioral disturbance: Secondary | ICD-10-CM | POA: Diagnosis present

## 2016-08-02 DIAGNOSIS — Z886 Allergy status to analgesic agent status: Secondary | ICD-10-CM

## 2016-08-02 DIAGNOSIS — F419 Anxiety disorder, unspecified: Secondary | ICD-10-CM | POA: Diagnosis present

## 2016-08-02 DIAGNOSIS — R570 Cardiogenic shock: Secondary | ICD-10-CM | POA: Diagnosis not present

## 2016-08-02 DIAGNOSIS — Z880 Allergy status to penicillin: Secondary | ICD-10-CM

## 2016-08-02 DIAGNOSIS — D649 Anemia, unspecified: Secondary | ICD-10-CM | POA: Diagnosis not present

## 2016-08-02 DIAGNOSIS — R6521 Severe sepsis with septic shock: Secondary | ICD-10-CM

## 2016-08-02 DIAGNOSIS — Z888 Allergy status to other drugs, medicaments and biological substances status: Secondary | ICD-10-CM

## 2016-08-02 LAB — COMPREHENSIVE METABOLIC PANEL
ALT: 13 U/L — ABNORMAL LOW (ref 17–63)
AST: 20 U/L (ref 15–41)
Albumin: 3.2 g/dL — ABNORMAL LOW (ref 3.5–5.0)
Alkaline Phosphatase: 56 U/L (ref 38–126)
Anion gap: 10 (ref 5–15)
BUN: 21 mg/dL — ABNORMAL HIGH (ref 6–20)
CHLORIDE: 106 mmol/L (ref 101–111)
CO2: 24 mmol/L (ref 22–32)
Calcium: 8.9 mg/dL (ref 8.9–10.3)
Creatinine, Ser: 1.56 mg/dL — ABNORMAL HIGH (ref 0.61–1.24)
GFR, EST AFRICAN AMERICAN: 42 mL/min — AB (ref >60)
GFR, EST NON AFRICAN AMERICAN: 37 mL/min — AB (ref >60)
Glucose, Bld: 150 mg/dL — ABNORMAL HIGH (ref 65–99)
POTASSIUM: 4.2 mmol/L (ref 3.5–5.1)
Sodium: 140 mmol/L (ref 135–145)
Total Bilirubin: 0.7 mg/dL (ref 0.3–1.2)
Total Protein: 6.1 g/dL — ABNORMAL LOW (ref 6.5–8.1)

## 2016-08-02 LAB — PROTIME-INR
INR: 1.14
INR: 1.33
PROTHROMBIN TIME: 14.6 s (ref 11.4–15.2)
PROTHROMBIN TIME: 16.6 s — AB (ref 11.4–15.2)

## 2016-08-02 LAB — CBC
HCT: 41.5 % (ref 39.0–52.0)
HEMATOCRIT: 43.5 % (ref 39.0–52.0)
Hemoglobin: 14 g/dL (ref 13.0–17.0)
Hemoglobin: 13.2 g/dL (ref 13.0–17.0)
MCH: 27.5 pg (ref 26.0–34.0)
MCH: 27.7 pg (ref 26.0–34.0)
MCHC: 32.2 g/dL (ref 30.0–36.0)
MCHC: 31.8 g/dL (ref 30.0–36.0)
MCV: 85.3 fL (ref 78.0–100.0)
MCV: 87 fL (ref 78.0–100.0)
PLATELETS: 171 10*3/uL (ref 150–400)
Platelets: 176 10*3/uL (ref 150–400)
RBC: 5.1 MIL/uL (ref 4.22–5.81)
RBC: 4.77 MIL/uL (ref 4.22–5.81)
RDW: 13.6 % (ref 11.5–15.5)
RDW: 13.8 % (ref 11.5–15.5)
WBC: 10.5 10*3/uL (ref 4.0–10.5)
WBC: 16.2 10*3/uL — ABNORMAL HIGH (ref 4.0–10.5)

## 2016-08-02 LAB — GLUCOSE, CAPILLARY: Glucose-Capillary: 229 mg/dL — ABNORMAL HIGH (ref 65–99)

## 2016-08-02 LAB — I-STAT CHEM 8, ED
BUN: 24 mg/dL — ABNORMAL HIGH (ref 6–20)
Calcium, Ion: 1.07 mmol/L — ABNORMAL LOW (ref 1.15–1.40)
Chloride: 104 mmol/L (ref 101–111)
Creatinine, Ser: 1.5 mg/dL — ABNORMAL HIGH (ref 0.61–1.24)
Glucose, Bld: 142 mg/dL — ABNORMAL HIGH (ref 65–99)
HEMATOCRIT: 45 % (ref 39.0–52.0)
HEMOGLOBIN: 15.3 g/dL (ref 13.0–17.0)
Potassium: 4.1 mmol/L (ref 3.5–5.1)
SODIUM: 141 mmol/L (ref 135–145)
TCO2: 22 mmol/L (ref 0–100)

## 2016-08-02 LAB — DIFFERENTIAL
BASOS ABS: 0 10*3/uL (ref 0.0–0.1)
BASOS PCT: 0 %
EOS ABS: 0.1 10*3/uL (ref 0.0–0.7)
Eosinophils Relative: 1 %
Lymphocytes Relative: 13 %
Lymphs Abs: 1.4 10*3/uL (ref 0.7–4.0)
MONO ABS: 0.9 10*3/uL (ref 0.1–1.0)
MONOS PCT: 8 %
Neutro Abs: 8.2 10*3/uL — ABNORMAL HIGH (ref 1.7–7.7)
Neutrophils Relative %: 78 %

## 2016-08-02 LAB — I-STAT TROPONIN, ED: TROPONIN I, POC: 0.03 ng/mL (ref 0.00–0.08)

## 2016-08-02 LAB — CBG MONITORING, ED: Glucose-Capillary: 134 mg/dL — ABNORMAL HIGH (ref 65–99)

## 2016-08-02 LAB — PROCALCITONIN: PROCALCITONIN: 0.22 ng/mL

## 2016-08-02 LAB — APTT
APTT: 31 s (ref 24–36)
APTT: 50 s — AB (ref 24–36)

## 2016-08-02 LAB — TROPONIN I: TROPONIN I: 0.14 ng/mL — AB (ref <0.03)

## 2016-08-02 MED ORDER — SODIUM CHLORIDE 0.9 % IV BOLUS (SEPSIS)
1000.0000 mL | Freq: Once | INTRAVENOUS | Status: AC
  Administered 2016-08-02: 1000 mL via INTRAVENOUS

## 2016-08-02 MED ORDER — SODIUM CHLORIDE 0.9 % IV SOLN
INTRAVENOUS | Status: DC
  Administered 2016-08-02 – 2016-08-04 (×4): via INTRAVENOUS

## 2016-08-02 MED ORDER — HEPARIN SODIUM (PORCINE) 5000 UNIT/ML IJ SOLN
4000.0000 [IU] | Freq: Once | INTRAMUSCULAR | Status: AC
  Administered 2016-08-02: 4000 [IU] via INTRAVENOUS

## 2016-08-02 MED ORDER — SODIUM CHLORIDE 0.9 % IV SOLN
250.0000 mL | Freq: Once | INTRAVENOUS | Status: AC
  Administered 2016-08-02: 250 mL via INTRAVENOUS

## 2016-08-02 MED ORDER — ALTEPLASE (PULMONARY EMBOLISM) INFUSION
50.0000 mg | Freq: Once | INTRAVENOUS | Status: AC
  Administered 2016-08-02: 50 mg via INTRAVENOUS
  Filled 2016-08-02: qty 50

## 2016-08-02 MED ORDER — MORPHINE SULFATE (PF) 2 MG/ML IV SOLN: 2.0000 mg | Freq: Once | INTRAVENOUS | Status: DC

## 2016-08-02 MED ORDER — NOREPINEPHRINE BITARTRATE 1 MG/ML IV SOLN
0.0000 ug/min | Freq: Once | INTRAVENOUS | Status: DC
  Filled 2016-08-02: qty 4

## 2016-08-02 MED ORDER — HEPARIN (PORCINE) IN NACL 100-0.45 UNIT/ML-% IJ SOLN
1330.0000 [IU]/h | INTRAMUSCULAR | Status: DC
  Administered 2016-08-02 (×2): 1200 [IU]/h via INTRAVENOUS
  Administered 2016-08-04 – 2016-08-06 (×3): 1350 [IU]/h via INTRAVENOUS
  Filled 2016-08-02 (×7): qty 250

## 2016-08-02 MED ORDER — SODIUM CHLORIDE 0.9 % IV BOLUS (SEPSIS)
500.0000 mL | Freq: Once | INTRAVENOUS | Status: AC
  Administered 2016-08-02: 500 mL via INTRAVENOUS

## 2016-08-02 MED ORDER — SODIUM CHLORIDE 0.9 % IV SOLN: 250.0000 mL | INTRAVENOUS | Status: DC | PRN

## 2016-08-02 MED ORDER — SODIUM CHLORIDE 0.9 % IV SOLN
1.0000 g | Freq: Once | INTRAVENOUS | Status: AC
  Administered 2016-08-02: 1 g via INTRAVENOUS
  Filled 2016-08-02: qty 10

## 2016-08-02 MED ORDER — PHENYLEPHRINE HCL 10 MG/ML IJ SOLN
0.0000 ug/min | INTRAVENOUS | Status: DC
  Administered 2016-08-02 (×3): 130 ug/min via INTRAVENOUS
  Administered 2016-08-02: 20 ug/min via INTRAVENOUS
  Administered 2016-08-03: 130 ug/min via INTRAVENOUS
  Filled 2016-08-02 (×15): qty 1

## 2016-08-02 MED ORDER — IOPAMIDOL (ISOVUE-370) INJECTION 76%
INTRAVENOUS | Status: DC
Start: 2016-08-02 — End: 2016-08-02
  Filled 2016-08-02: qty 100

## 2016-08-02 MED ORDER — HEPARIN BOLUS VIA INFUSION
4000.0000 [IU] | Freq: Once | INTRAVENOUS | Status: DC
  Filled 2016-08-02: qty 4000

## 2016-08-02 NOTE — ED Notes (Signed)
[name], [phone]  Pt.s daughter

## 2016-08-02 NOTE — ED Notes (Signed)
Pt CBG, 134. Nurse was notified.

## 2016-08-02 NOTE — ED Notes (Signed)
Reported trop 0.14 to primary nurse Clyda Greener

## 2016-08-02 NOTE — ED Notes (Signed)
Nurse unable to take report.  Will call this nurse back

## 2016-08-02 NOTE — ED Notes (Signed)
Reported to Dr. Wyvonnia Dusky pt.s BP is 67/39 on 70 mcg Neo-Synephrine

## 2016-08-02 NOTE — ED Provider Notes (Signed)
[facility] DEPT Provider Note   CSN: [id number] Arrival date & time: 08/02/16  1541     History   Chief Complaint Chief Complaint  Patient presents with  . Altered Mental Status    HPI [name] is a 82 y.o. male.  The history is provided by the EMS personnel, the patient, a relative and a caregiver (home health providers Glade Stanford, and Judson Roch). The history is limited by the condition of the patient.  Loss of Consciousness   This is a new problem. The current episode started less than 1 hour ago. The problem occurs constantly. The problem has been resolved. He lost consciousness for a period of 1 to 5 minutes. The problem is associated with normal activity (ambulating at time, when collapsed, started having jerking after collapse). Associated symptoms include confusion, malaise/fatigue and weakness. Pertinent negatives include bowel incontinence, fever, focal weakness, slurred speech and vomiting. Associated symptoms comments: dyspnea. His past medical history is significant for HTN. His past medical history does not include CVA or seizures.    Past Medical History:  Diagnosis Date  . Anxiety   . BPH (benign prostatic hypertrophy)   . Constipation, chronic   . Dementia    situational  . Diverticular hemorrhage 2014  . Frequency of urination   . Hemorrhoids    "lift chair helped"  . Hypertension   . Kidney stone on left side   . Pneumonia    hx of age 24  . Skin cancer   . 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 Diverticulosis of colon with hemorrhage 01/28/2013  . Stenosis, anal canal 01/28/2013  . Lower GI bleed 01/26/2013  . HTN (hypertension) 01/26/2013  . Chronic constipation 01/26/2013    Past Surgical History:  Procedure Laterality Date  . BLADDER SURGERY  1980's  . COLONOSCOPY N/A 01/28/2013   Procedure: COLONOSCOPY;  Surgeon: Jerene Bears, MD;  Location: [facility];  Service:  Gastroenterology;  Laterality: N/A;  . CYSTOSCOPY WITH RETROGRADE PYELOGRAM, URETEROSCOPY AND STENT PLACEMENT Left 03/09/2015   Procedure: CYSTOSCOPY/LEFT URETEROSCOPY/ RETROGRADE PYLEGRAM HOLMIUM LASER, STONE  BASKET/STENT PLACEMENT;  Surgeon: Festus Aloe, MD;  Location: [facility];  Service: Urology;  Laterality: Left;  . HEMORRHOID SURGERY  1980's  . SKIN CANCER EXCISION         Home Medications    Prior to Admission medications   Medication Sig Start Date End Date Taking? Authorizing Provider  DULoxetine (CYMBALTA) 60 MG capsule Take 60 mg by mouth daily. 07/31/16   Historical Provider, MD  gabapentin (NEURONTIN) 100 MG capsule Take 100 mg by mouth 3 (three) times daily. 07/31/16   Historical Provider, MD  gabapentin (NEURONTIN) 300 MG capsule Take 300 mg by mouth at bedtime.  07/31/16   Historical Provider, MD  LORazepam (ATIVAN) 0.5 MG tablet Take 0.5 mg by mouth every 8 (eight) hours as needed for anxiety.     Historical Provider, MD  losartan (COZAAR) 50 MG tablet Take 50 mg by mouth daily. 07/31/16   Historical Provider, MD  memantine (NAMENDA XR) 28 MG CP24 24 hr capsule Take 28 mg by mouth daily.    Historical Provider, MD  risperiDONE (RISPERDAL) 0.5 MG tablet Take 0.5 mg by mouth at bedtime.     Historical Provider, MD  Tamsulosin HCl (FLOMAX) 0.4 MG CAPS Take 0.4 mg by mouth daily.    Historical Provider, MD    Family History No family history  on file.  Social History Social History  Substance Use Topics  . Smoking status: Former Smoker    Packs/day: 2.00    Years: 10.00    Types: Cigarettes  . Smokeless tobacco: Never Used     Comment: quit years ago  . Alcohol use No     Allergies   Amoxicillin; Codeine; Feldene [piroxicam]; Indocin [indomethacin]; Morphine and related; Other; and Uroxatral [alfuzosin hcl er]   Review of Systems Review of Systems  Unable to perform ROS: Mental status change (and acuity of condition)  Constitutional: Positive for fatigue  and malaise/fatigue. Negative for fever.  Respiratory: Positive for shortness of breath.   Cardiovascular: Positive for syncope. Negative for leg swelling.  Gastrointestinal: Negative for bowel incontinence and vomiting.  Skin: Negative for rash.  Neurological: Positive for weakness. Negative for focal weakness.  Psychiatric/Behavioral: Positive for confusion.     Physical Exam Updated Vital Signs There were no vitals taken for this visit.  Physical Exam  Constitutional: He appears well-developed and well-nourished. He appears distressed.  Awake, alert, agitated, repetitively states "I can't breath"  HENT:  Head: Normocephalic and atraumatic.  Eyes: Conjunctivae are normal. Pupils are equal, round, and reactive to light. No scleral icterus.  Arcus senilis  Neck: Normal range of motion. Neck supple. JVD present. No tracheal deviation present.  Cardiovascular: Regular rhythm.   Murmur heard. Tachycardic on arrival  Pulmonary/Chest: He is in respiratory distress. He has no wheezes. He has no rales.  tachypneic despite NRB  Abdominal: Soft. He exhibits no distension. There is no tenderness.  Genitourinary:  Genitourinary Comments: No obvious blood or melena per rectum  Musculoskeletal: He exhibits no edema or tenderness.  Symmetric size and appearance of b/l Le's, no calf swelling or tenderness  Neurological: He is alert. He exhibits normal muscle tone. Coordination normal.  Moving all 4 extremities vigorously. Able to turn himself onto either side. Very limited cooperation with neuro exam. No facial droop  Skin: Skin is warm and dry. No rash noted. He is not diaphoretic.  Psychiatric:  Anxious mood/affect  Nursing note and vitals reviewed.    ED Treatments / Results  Labs (all labs ordered are listed, but only abnormal results are displayed) Labs Reviewed  CBG MONITORING, ED - Abnormal; Notable for the following:       Result Value   Glucose-Capillary 134 (*)    All other  components within normal limits  I-STAT CHEM 8, ED - Abnormal; Notable for the following:    BUN 24 (*)    Creatinine, Ser 1.50 (*)    Glucose, Bld 142 (*)    Calcium, Ion 1.07 (*)    All other components within normal limits  CULTURE, BLOOD (ROUTINE X 2)  CULTURE, BLOOD (ROUTINE X 2)  URINE CULTURE  PROTIME-INR  APTT  CBC  DIFFERENTIAL  COMPREHENSIVE METABOLIC PANEL  I-STAT TROPOININ, ED  CBG MONITORING, ED  I-STAT ARTERIAL BLOOD GAS, ED  I-STAT TROPOININ, ED    EKG  EKG Interpretation None       Radiology Dg Chest 2 View  Result Date: 07/31/2016 CLINICAL DATA:  Cough for several days and fever EXAM: CHEST  2 VIEW COMPARISON:  07/28/2012 FINDINGS: There is mild cardiomegaly. There is atherosclerosis of the thoracic aorta. Calcified granuloma in the left upper lobe is stable. No pneumonic consolidation, CHF, or pneumothorax. Minimal blunting of the right costophrenic angle. Glenohumeral and AC joint osteoarthritis bilaterally. IMPRESSION: No active cardiopulmonary disease. Electronically Signed   By: Ashley Royalty  M.D.   On: 07/31/2016 22:08    Procedures Procedures (including critical care time)  Medications Ordered in ED Medications  sodium chloride 0.9 % bolus 1,000 mL (not administered)     Initial Impression / Assessment and Plan / ED Course  I have reviewed the triage vital signs and the nursing notes.  Pertinent labs & imaging results that were available during my care of the patient were reviewed by me and considered in my medical decision making (see chart for details).  Clinical Course   [name] is a 82 y.o. male with h/o HTN who presents to ED via EMS as a code stroke for generalized weakness and AMS after witnessed syncopal episode with convulsions that occurred PTA in presence of home health caregiver Margaretha Sheffield ([phone]--emergency contact number). Pt has been feeling weak/fatigue with decreased appetite for about a week, getting worse. By EMS  initial BP about 70/40 manual, though unable to confirm 2/2 agitation of pt who kept stating en route "I can't breath". Pt continues to state that on arrival here, in distress with O2 sat in 70's on NRB mask. A very limited emergent bedside ECHO preformed (no images documented 2/2 emergent procedure), which demonstrated significant RV strain with D-shape of RV and relative normal motion of apex of Rv. Concerning for massive Pe, which fits well with the clinical presentation, though pt does not have clinical appearance of DVT of Le's.  Called and spoke with Margaretha Sheffield who confirms the story. Pt's other home health care givers Elmyra Ricks and Judson Roch arrive to [place], who provide contact for daughter [name] [phone] (home), [phone] (mobile).   Both myself and Dr. Wyvonnia Dusky spoke with and updated pt daughter Ms. Ford, who agrees that pt would not want intubated or other artificial life support. Confirms pt pre-existing plan for DNR/DNI. Spoke with granddaughter Stanton Kidney via phone, updated on condition of pt. After extensive discussion of the risks/benefits of trial of tPA with family, daughter consents to tPA verbally over phone without the definitive dx by CT scan, as pt is too unstable to lay still or lay flat for CT imaging. Despite titrating up on Neosynephrine, pt BP continued to be low. His breathing however did seem to relatively stabilize within an hour or so of tPA, unclear if 2/2 decreased pt agitation or improvement of PE with tPA. Pt transferred to MICU under care of intensivist while awaiting family arrival from [place].   Pt condition, course, and admission were discussed with attending physician Dr. Ezequiel Essex.  Final Clinical Impressions(s) / ED Diagnoses   Final diagnoses:  Altered mental status    New Prescriptions New Prescriptions   No medications on file     Paralee Cancel, MD 08/03/16 [id number]    Ezequiel Essex, MD 08/03/16 1108

## 2016-08-02 NOTE — ED Notes (Signed)
Admitting MD arrived and ordered US to hold the Levophed

## 2016-08-02 NOTE — ED Notes (Signed)
Code stroke canceled by neurologist. 

## 2016-08-02 NOTE — Progress Notes (Addendum)
[facility]  I have seen and examined the patient with nurse practitioner/resident and agree with the note above.  We formulated the plan together and I elicited the following history.    This note is written in conjunction with Rahul Desai's note from today.  I have independently seen and examined the patient.  I could not take a history due to encephalopathy.  He was seen in our ER two days ago for fever and was noted to have urinary retention.  A foley was placed and he was to follow up with urology.  He returned today with syncope after a few days of feeling weak.  In the ER he was noted to be hypotensive and complained he could not breathe.  O2 saturations were 75% on arrival.  A bedside echocardiogram was worrisome for RV strain. He was given TPA for empiric treatment of PE.  He is now on heparin and is receiving neosynephrine for vasopressor support.  Past Medical History:  Diagnosis Date  . Anxiety   . BPH (benign prostatic hypertrophy)   . Constipation, chronic   . Dementia    situational  . Diverticular hemorrhage 2014  . Frequency of urination   . Hemorrhoids    "lift chair helped"  . Hypertension   . Kidney stone on left side   . Pneumonia    hx of age 82  . Skin cancer   . Transient global amnesia 15-20 years ago     On exam  Vitals:   08/02/16 2130 08/02/16 2140 08/02/16 2154 08/02/16 2200  BP: 93/75 114/85 106/72 104/64  Pulse: 87 91 89 95  Resp: 14 15 14 21   SpO2: 100% 100% 100% 100%  Weight:        Elderly male, lying in bed Mucus membranes moist, non-rebreather mask in place CV: RRR, slight systolic murmur Lungs clear, no wheezing no rhonchi Extremeties well perfused, no edema  Labs reviewed, WBC slightly elevated, Cr 1.5, U/A clean, mildly elevated troponin  EKG: ST elevation inferior leads, TWI Lead 1, ST depression lateral precordial leads Compared to prior studies the ST wave changes in the inferior limb leads are new  Impression/plan Acute  hypotension with hypoxemia, dyspnea, and syncope: picture equally worrisome to me for an acute RCA occlusion vs possible PE.  Doubt sepsis as no clear source.   > continue heparin > check d-dimer > check echo > check V/Q in AM  > repeat 12 lead now > cardiology consultation  Rest as above  DNR  Will update family now  My cc time independent of APP 35 minutes  Roselie Awkward, MD [facility] Pager: [phone] Cell: [phone] After 3pm or if no response, call [phone]

## 2016-08-02 NOTE — Progress Notes (Addendum)
ANTICOAGULATION CONSULT NOTE - Initial Consult  Pharmacy Consult for Heparin Indication: pulmonary embolus  Allergies  Allergen Reactions  . Amoxicillin Diarrhea  . Codeine   . Feldene [Piroxicam]   . Indocin [Indomethacin]   . Morphine And Related Nausea And Vomiting  . Other     omnicor- unknown reaction  . Uroxatral [Alfuzosin Hcl Er]    Patient Measurements: Weight: 160 lb (72.6 kg) Heparin Dosing Weight: 72.6  Vital Signs: BP: 99/85 (10/18 1601) Pulse Rate: 48 (10/18 1601)  Labs:  Recent Labs  07/31/16 2116 08/02/16 1547 08/02/16 1551  HGB 15.3 14.0 15.3  HCT 45.0 43.5 45.0  PLT  --  176  --   APTT  --  31  --   LABPROT  --  14.6  --   INR  --  1.14  --   CREATININE 1.40* 1.56* 1.50*   CrCl cannot be calculated (Unknown ideal weight.).  Medical History: Past Medical History:  Diagnosis Date  . Anxiety   . BPH (benign prostatic hypertrophy)   . Constipation, chronic   . Dementia    situational  . Diverticular hemorrhage 2014  . Frequency of urination   . Hemorrhoids    "lift chair helped"  . Hypertension   . Kidney stone on left side   . Pneumonia    hx of age 31  . Skin cancer   . Transient global amnesia 15-20 years ago    Assessment: 82 year old man with massive PE. No known  anticoagulants PTA. Patient was started on heparin infusion and patient's blood pressure and oxygen saturation continued to decline. Patient is DNR/DNI and decision was made to give tPA (50mg ) as benefits were felt to be greater than risks. Patient too unstable for CT scan, however bedside imaging showed enlarged right ventricle. Pharmacy has been asked to dose heparin.  Goal of Therapy:  Heparin level 0.3-0.5 units/ml (for 24 hours then can use 0.3-0.7) Monitor platelets by anticoagulation protocol: Yes   Plan:  Start heparin infusion at 900 units/hour (restarted post tPA around 2200) Heparin with am labs, CBC   Kenadee Gates L Zonie Crutcher 08/02/2016,4:57 PM

## 2016-08-02 NOTE — H&P (Signed)
PULMONARY / CRITICAL CARE MEDICINE   Name: [name] MRN: [id number] DOB: 17-Nov-1920    ADMISSION DATE:  08/02/2016 CONSULTATION DATE:  08/02/16  REFERRING MD:  [facility] - EDP  CHIEF COMPLAINT:  Syncope, AMS, Hypoxia, Hypotension  HISTORY OF PRESENT ILLNESS:  Pt is encephelopathic; therefore, this HPI is obtained from chart review. [name] is a 82 y.o. male with PMH as outlined below and who resides at home but has aide's to assist him with ADL's (though they report he is fairly independent).  He was brought to [facility] ED 10/18 with generalized weakness and AMS following a witnessed syncopal episode.  Following this, he began to have jerk like movements. He apparently has been feeling weak for a week or so along with decreased appetite.  No known chills/sweats, headaches, chest pain, SOB, N/V/D, abd pain, myalgias.  He did have fevers 2 days prior for which he was seen in ED.  He was found to have urinary retention and was discharged with instructions to f/u with urology.  On EMS arrival, pt was hypotensive with SBP in 70's.  He also complained stating "I can't breathe".    In ED, he remained hypotensive and was found to be hypoxic. He was tachycardic in the one-teens and EKG showed t-wave inversions in multiple leads.  Due to the constellation of findings, bedside echo was performed by EDP.  Per report, this showed hypodynamic RV with possible McConnell's sign.  Pt was subsequently given empiric alteplase for presumed PE.  He was also started on neo-synephrine for persistent hypotension.  PCCM was subsequently called for admission.  Of note, pt is a full DNR / DNI which was confirmed with family by EDP.  PAST MEDICAL HISTORY :  He  has a past medical history of Anxiety; BPH (benign prostatic hypertrophy); Constipation, chronic; Dementia; Diverticular hemorrhage (2014); Frequency of urination; Hemorrhoids; Hypertension; Kidney stone on left side; Pneumonia; Skin cancer; and Transient  global amnesia (15-20 years ago).  PAST SURGICAL HISTORY: He  has a past surgical history that includes Colonoscopy (N/A, 01/28/2013); Skin cancer excision; Hemorrhoid surgery (1980's); Bladder surgery (1980's); and Cystoscopy with retrograde pyelogram, ureteroscopy and stent placement (Left, 03/09/2015).  Allergies  Allergen Reactions  . Amoxicillin Diarrhea  . Codeine Other (See Comments)  . Feldene [Piroxicam] Other (See Comments)  . Indocin [Indomethacin] Other (See Comments)  . Morphine And Related Nausea And Vomiting  . Other     omnicor- unknown reaction  . Uroxatral [Alfuzosin Hcl Er] Other (See Comments)    No current facility-administered medications on file prior to encounter.    Current Outpatient Prescriptions on File Prior to Encounter  Medication Sig  . DULoxetine (CYMBALTA) 60 MG capsule Take 60 mg by mouth daily.  Marland Kitchen gabapentin (NEURONTIN) 100 MG capsule Take 100 mg by mouth 3 (three) times daily.  Marland Kitchen gabapentin (NEURONTIN) 300 MG capsule Take 300 mg by mouth at bedtime.   Marland Kitchen LORazepam (ATIVAN) 0.5 MG tablet Take 0.25-0.5 mg by mouth 3 (three) times daily. For agitation or anxiety  . losartan (COZAAR) 50 MG tablet Take 50 mg by mouth daily.  . memantine (NAMENDA XR) 28 MG CP24 24 hr capsule Take 28 mg by mouth daily.  . risperiDONE (RISPERDAL) 0.5 MG tablet Take 0.5 mg by mouth every morning.   . Tamsulosin HCl (FLOMAX) 0.4 MG CAPS Take 0.4 mg by mouth daily.    FAMILY HISTORY:  His has no family status information on file.    SOCIAL HISTORY: He  reports that he has quit smoking. His smoking use included Cigarettes. He has a 20.00 pack-year smoking history. He has never used smokeless tobacco. He reports that he does not drink alcohol or use drugs.  REVIEW OF SYSTEMS:   Unable to obtain as pt is encephalopathic.  SUBJECTIVE:  Awake but mumbles random words.  Seems agitated about NRB.  VITAL SIGNS: BP (!) 81/52   Pulse 95   Resp 20   Wt 160 lb (72.6 kg)    SpO2 100%   BMI 21.70 kg/m   HEMODYNAMICS:    VENTILATOR SETTINGS:    INTAKE / OUTPUT: No intake/output data recorded.   PHYSICAL EXAMINATION: General: Elderly male, chronically ill appearing, anxious. Neuro: Awake but agitated, no focal deficits.  HEENT: [place]/AT. PERRL, sclerae anicteric. Cardiovascular: Tachy, irregular, no M/R/G.  Lungs: Respirations even and unlabored.  CTA bilaterally, No W/R/R. Abdomen: BS x 4, soft, NT/ND.  Musculoskeletal: No gross deformities, no edema.  Skin: Intact, warm, no rashes.  LABS:  BMET  Recent Labs Lab 07/31/16 2116 08/02/16 1547 08/02/16 1551  NA 142 140 141  K 4.3 4.2 4.1  CL 105 106 104  CO2  --  24  --   BUN 28* 21* 24*  CREATININE 1.40* 1.56* 1.50*  GLUCOSE 107* 150* 142*    Electrolytes  Recent Labs Lab 08/02/16 1547  CALCIUM 8.9    CBC  Recent Labs Lab 08/02/16 1547 08/02/16 1551 08/02/16 1850  WBC 10.5  --  16.2*  HGB 14.0 15.3 13.2  HCT 43.5 45.0 41.5  PLT 176  --  171    Coag's  Recent Labs Lab 08/02/16 1547  APTT 31  INR 1.14    Sepsis Markers No results for input(s): LATICACIDVEN, PROCALCITON, O2SATVEN in the last 168 hours.  ABG No results for input(s): PHART, PCO2ART, PO2ART in the last 168 hours.  Liver Enzymes  Recent Labs Lab 08/02/16 1547  AST 20  ALT 13*  ALKPHOS 56  BILITOT 0.7  ALBUMIN 3.2*    Cardiac Enzymes No results for input(s): TROPONINI, PROBNP in the last 168 hours.  Glucose  Recent Labs Lab 08/02/16 1550  GLUCAP 134*    Imaging Dg Chest Portable 1 View  Result Date: 08/02/2016 CLINICAL DATA:  Altered mental status. EXAM: PORTABLE CHEST 1 VIEW COMPARISON:  07/31/2016 FINDINGS: 1549 hours. The cardio pericardial silhouette is enlarged. The lungs are clear wiithout focal pneumonia, edema, pneumothorax or pleural effusion. Interstitial markings are diffusely coarsened with chronic features. Calcified granuloma left mid lung again noted. The visualized  bony structures of the thorax are intact. Telemetry leads overlie the chest. IMPRESSION: Cardiomegaly with chronic interstitial changes. No acute cardiopulmonary findings. Electronically Signed   By: Misty Stanley M.D.   On: 08/02/2016 16:26     STUDIES:  CXR 10/18 > no acute process.  CULTURES: Blood 10/18 > Urine 10/18 >  ANTIBIOTICS: None.  SIGNIFICANT EVENTS: 10/18 > admitted with concern for PE > given empiric alteplase in ED.  Started on neo-synephrine for hypotension.  LINES/TUBES: None.  DISCUSSION: 82 y.o. male who was brought to [facility] ED with hypotension, hypoxia, syncopal episode.  There was concern for PE; therefore, he was given empiric alteplase.  He was also started on neo-synephrine for persistent hypotension.  PCCM subsequently called for admission.  Of note, pt is a full DNR / DNI.  ASSESSMENT / PLAN:  PULMONARY A: Concern for PE - no clear evidence but constellation of findings raises suspicion (syncope, hypotension, hypoxia, t-wave inversions,  findings on bedside echo by EDP of possible McConnell's sign).  He is currently receiving alteplase which was started in ED (discussed with family prior and risks explained including fatal bleed).  Not stable enough for CTA. Acute hypoxic respiratory failure - due to above. DO NOT INTUBATE. P:   Continue alteplase (2 hour infusion). Start heparin gtt per usual protocol. Assess echo, LE duplex. Continue supplemental O2 as needed to maintain SpO2 > 92%. BiPAP if needed for increased WOB.  CARDIOVASCULAR A:  Hypotension - unclear etiology.  Concern for obstructive shock from PE (but no clear evidence). Possible septic given that he had fever with urinary retention just 2 days prior. Hx HTN. P:  Continue neo-synephrine as needed for goal MAP > 65. Assess cortisol, lactate, PCT. Trend troponin. Hold preadmission losartan.  RENAL A:   AKI. P:   NS @ 100. 1g Ca gluconate. BMP in AM.  GASTROINTESTINAL A:    Nutrition. P:   NPO.  HEMATOLOGIC A:   S/p alteplase for possible PE. R/o DVT. P:  No SCD's until DVT ruled out. Heparin per protocol (after alteplase). CBC in AM.  INFECTIOUS A:   No clear evidence of infection - but can not rule out given fever + urinary retention 2 days prior. P:   Follow cultures as above. Assess PCT, if high then start empiric abx.  ENDOCRINE A:   No acute issues. P:   Monitor glucose on BMP.  NEUROLOGIC A:   Syncopal episode - ? PE. Acute encephalopathy. Hx dementia, anxiety. P:   Avoid sedating meds. Hold preadmission duloxetine, gabapentin, lorazepam, memantine, risperidone.  Can consider re-starting in AM if more stable.  Family updated: None available.  Daughter Dillon Bjork) called by EDP and updated.  Risks / Benefits of alteplase reportedly explained including possible fatal bleed and daughter willing to proceed (apparently there is a granddaughter who is a Nature conservation officer MD).  Daughter Lorna Few is currently flying to [place] from somewhere in [place].  Interdisciplinary Family Meeting v Palliative Care Meeting:  Due by: 10/24.  CC time: 35 minutes.   Montey Hora, [place] - C [place] [facility] Pager: [phone]  or [phone] 08/02/2016, 7:36 PM

## 2016-08-03 ENCOUNTER — Inpatient Hospital Stay (HOSPITAL_COMMUNITY): Payer: Medicare Other

## 2016-08-03 ENCOUNTER — Encounter (HOSPITAL_COMMUNITY): Payer: Self-pay | Admitting: *Deleted

## 2016-08-03 ENCOUNTER — Other Ambulatory Visit (HOSPITAL_COMMUNITY): Payer: Medicare Other

## 2016-08-03 DIAGNOSIS — R0602 Shortness of breath: Secondary | ICD-10-CM

## 2016-08-03 DIAGNOSIS — I2699 Other pulmonary embolism without acute cor pulmonale: Principal | ICD-10-CM

## 2016-08-03 LAB — GLUCOSE, CAPILLARY
Glucose-Capillary: 85 mg/dL (ref 65–99)
Glucose-Capillary: 88 mg/dL (ref 65–99)
Glucose-Capillary: 94 mg/dL (ref 65–99)
Glucose-Capillary: 98 mg/dL (ref 65–99)
Glucose-Capillary: 98 mg/dL (ref 65–99)

## 2016-08-03 LAB — CBC
HCT: 40.8 % (ref 39.0–52.0)
HCT: 35.6 % — ABNORMAL LOW (ref 39.0–52.0)
HEMATOCRIT: 37.7 % — AB (ref 39.0–52.0)
HEMOGLOBIN: 11.5 g/dL — AB (ref 13.0–17.0)
HEMOGLOBIN: 12.1 g/dL — AB (ref 13.0–17.0)
Hemoglobin: 13 g/dL (ref 13.0–17.0)
MCH: 27.4 pg (ref 26.0–34.0)
MCH: 27.3 pg (ref 26.0–34.0)
MCH: 27.3 pg (ref 26.0–34.0)
MCHC: 32.1 g/dL (ref 30.0–36.0)
MCHC: 31.9 g/dL (ref 30.0–36.0)
MCHC: 32.3 g/dL (ref 30.0–36.0)
MCV: 86.1 fL (ref 78.0–100.0)
MCV: 84.4 fL (ref 78.0–100.0)
MCV: 85.1 fL (ref 78.0–100.0)
PLATELETS: 153 10*3/uL (ref 150–400)
Platelets: 163 10*3/uL (ref 150–400)
Platelets: 149 10*3/uL — ABNORMAL LOW (ref 150–400)
RBC: 4.74 MIL/uL (ref 4.22–5.81)
RBC: 4.22 MIL/uL (ref 4.22–5.81)
RBC: 4.43 MIL/uL (ref 4.22–5.81)
RDW: 13.8 % (ref 11.5–15.5)
RDW: 13.8 % (ref 11.5–15.5)
RDW: 13.7 % (ref 11.5–15.5)
WBC: 15.2 10*3/uL — ABNORMAL HIGH (ref 4.0–10.5)
WBC: 11.8 10*3/uL — ABNORMAL HIGH (ref 4.0–10.5)
WBC: 11.7 10*3/uL — AB (ref 4.0–10.5)

## 2016-08-03 LAB — LACTIC ACID, PLASMA
Lactic Acid, Venous: 3.7 mmol/L (ref 0.5–1.9)
Lactic Acid, Venous: 2.4 mmol/L (ref 0.5–1.9)

## 2016-08-03 LAB — URINE MICROSCOPIC-ADD ON

## 2016-08-03 LAB — BASIC METABOLIC PANEL
Anion gap: 9 (ref 5–15)
BUN: 23 mg/dL — ABNORMAL HIGH (ref 6–20)
CALCIUM: 8 mg/dL — AB (ref 8.9–10.3)
CO2: 20 mmol/L — AB (ref 22–32)
CREATININE: 1.7 mg/dL — AB (ref 0.61–1.24)
Chloride: 108 mmol/L (ref 101–111)
GFR calc non Af Amer: 33 mL/min — ABNORMAL LOW (ref >60)
GFR, EST AFRICAN AMERICAN: 38 mL/min — AB (ref >60)
Glucose, Bld: 246 mg/dL — ABNORMAL HIGH (ref 65–99)
Potassium: 4.4 mmol/L (ref 3.5–5.1)
SODIUM: 137 mmol/L (ref 135–145)

## 2016-08-03 LAB — PROTIME-INR
INR: 1.29
PROTHROMBIN TIME: 16.2 s — AB (ref 11.4–15.2)

## 2016-08-03 LAB — PROCALCITONIN: Procalcitonin: 0.67 ng/mL

## 2016-08-03 LAB — PHOSPHORUS: PHOSPHORUS: 4.1 mg/dL (ref 2.5–4.6)

## 2016-08-03 LAB — URINALYSIS, ROUTINE W REFLEX MICROSCOPIC
GLUCOSE, UA: NEGATIVE mg/dL
Ketones, ur: NEGATIVE mg/dL
Nitrite: NEGATIVE
PROTEIN: 100 mg/dL — AB
Specific Gravity, Urine: 1.021 (ref 1.005–1.030)
pH: 5.5 (ref 5.0–8.0)

## 2016-08-03 LAB — URINE CULTURE: Culture: <10000 — AB

## 2016-08-03 LAB — TROPONIN I: TROPONIN I: 0.44 ng/mL — AB (ref <0.03)

## 2016-08-03 LAB — HEPARIN LEVEL (UNFRACTIONATED)
HEPARIN UNFRACTIONATED: 0.21 [IU]/mL — AB (ref 0.30–0.70)
Heparin Unfractionated: 0.19 IU/mL — ABNORMAL LOW (ref 0.30–0.70)

## 2016-08-03 LAB — MAGNESIUM: Magnesium: 1.8 mg/dL (ref 1.7–2.4)

## 2016-08-03 LAB — ECHOCARDIOGRAM COMPLETE: Weight: 2560 oz

## 2016-08-03 LAB — APTT: APTT: 53 s — AB (ref 24–36)

## 2016-08-03 LAB — CORTISOL: Cortisol, Plasma: 24 ug/dL

## 2016-08-03 LAB — D-DIMER, QUANTITATIVE (NOT AT ARMC)

## 2016-08-03 LAB — MRSA PCR SCREENING: MRSA BY PCR: NEGATIVE

## 2016-08-03 MED ORDER — IOPAMIDOL (ISOVUE-370) INJECTION 76%
INTRAVENOUS | Status: AC
  Administered 2016-08-03: 80 mL
  Filled 2016-08-03: qty 100

## 2016-08-03 MED ORDER — INSULIN ASPART 100 UNIT/ML ~~LOC~~ SOLN: 2.0000 [IU] | SUBCUTANEOUS | Status: DC

## 2016-08-03 MED ORDER — MAGNESIUM SULFATE 2 GM/50ML IV SOLN
2.0000 g | Freq: Once | INTRAVENOUS | Status: AC
  Administered 2016-08-03: 2 g via INTRAVENOUS
  Filled 2016-08-03: qty 50

## 2016-08-03 MED ORDER — SODIUM CHLORIDE 0.9 % IV BOLUS (SEPSIS)
500.0000 mL | Freq: Once | INTRAVENOUS | Status: AC
  Administered 2016-08-03: 1000 mL via INTRAVENOUS

## 2016-08-03 NOTE — Progress Notes (Signed)
Call from Dr Radford Pax  - Echo - large RV with poor systolic function. Hard to vlsualize LV but seems ok. High PASP   Dr. Brand Males, M.D., [facility].C.P Pulmonary and Critical Care Medicine Staff Physician [place] [facility] Pager: [phone], If no answer or between  15:00h - 7:00h: call [phone]  08/03/2016 1:05 AM

## 2016-08-03 NOTE — Progress Notes (Signed)
This rn drew and sent am labs to laboratory via tube station 101, lactic acid resulted, cbc heparin level and bmet not resulted. This rn called and spoke to lead lab tech [place] who states specimens were not received into laboratory, kim unable to explain how specimen for lactic acid was received into lab and other specimens were not received, lab manager derrick lane unavailable at this time to address concerns regarding this issue.

## 2016-08-03 NOTE — H&P (Signed)
PULMONARY / CRITICAL CARE MEDICINE   Name: [name] MRN: [id number] DOB: 1921/04/05    ADMISSION DATE:  08/02/2016 CONSULTATION DATE:  08/02/16  REFERRING MD:  [place] - EDP  CHIEF COMPLAINT:  Syncope, AMS, Hypoxia, Hypotension  BRIEF [name] is a 82 y.o. male with PMH as outlined below and who resides at home but has aide's to assist him with ADL's (though they report he is fairly independent).  He was brought to [facility] ED 10/18 with generalized weakness and AMS following a witnessed syncopal episode.  Following this, he began to have jerk like movements. He apparently has been feeling weak for a week or so along with decreased appetite.  No known chills/sweats, headaches, chest pain, SOB, N/V/D, abd pain, myalgias.  He did have fevers 2 days prior for which he was seen in ED.  He was found to have urinary retention and was discharged with instructions to f/u with urology.  On EMS arrival, pt was hypotensive with SBP in 70's.  He also complained stating "I can't breathe".    In ED, he remained hypotensive and was found to be hypoxic. He was tachycardic in the one-teens and EKG showed t-wave inversions in multiple leads.  Due to the constellation of findings, bedside echo was performed by EDP.  Per report, this showed hypodynamic RV with possible McConnell's sign.  Pt was subsequently given empiric alteplase for presumed PE.  He was also started on neo-synephrine for persistent hypotension.  PCCM was subsequently called for admission.  Of note, pt is a full DNR / DNI which was confirmed with family by EDP.  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  PAST SURGICAL HISTORY: He  has a past surgical history that  includes Colonoscopy (N/A, 01/28/2013); Skin cancer excision; Hemorrhoid surgery (1980's); Bladder surgery (1980's); and Cystoscopy with retrograde pyelogram, ureteroscopy and stent placement (Left, 03/09/2015).    STUDIES:  CXR 10/18 > no acute process.  CULTURES: Blood 10/18 > Urine 10/18 >  ANTIBIOTICS: None.  SIGNIFICANT EVENTS: 10/18 > admitted with concern for PE > given empiric alteplase in ED.  Started on neo-synephrine for hypotension.  LINES/TUBES: None.    SUBJECTIVE/OVERNIGHT/INTERVAL HX 08/03/16 - call from Dr Radford Pax cards - echo with severe RV dysfn . LV likely ok. Currently mronign rounds was sleeping. Caregiver at bedside. Denied any issues .  Creat worse. Mag < 2. Trop 0.44 Lactate > 2 but better since admit. Still on neo. RN reports hyperglycemia   VITAL SIGNS: BP 105/84   Pulse 76   Temp 97.6 F (36.4 C) (Axillary)   Resp (!) 34   Wt 78.9 kg (173 lb 15.1 oz)   SpO2 96%   BMI 23.59 kg/m   HEMODYNAMICS:    VENTILATOR SETTINGS:    INTAKE / OUTPUT: I/O last 3 completed shifts: In: 4781.5 [I.V.:3181.5; IV Piggyback:1600] Out: 87 [Urine:220]   PHYSICAL EXAMINATION: General: Elderly male, chronically ill appearing,  Neuro: sleeping (per caregive moving all 4s)  HEENT: [place]/AT. PERRL, sclerae anicteric. Cardiovascular: Tachy, irregular, no M/R/G.  Lungs: Respirations even and unlabored.  CTA bilaterally, No W/R/R. Abdomen: BS x 4, soft, NT/ND.  Musculoskeletal: No gross deformities, no edema.  Skin: Intact, warm, no rashes.  LABS: PULMONARY  Recent Labs Lab 07/31/16 2116 08/02/16 1551  TCO2 27 22    CBC  Recent Labs Lab 08/02/16 1547 08/02/16 1551 08/02/16 1850 08/03/16 0046  HGB 14.0 15.3 13.2 13.0  HCT 43.5 45.0 41.5 40.8  WBC 10.5  --  16.2* 15.2*  PLT 176  --  171 163    COAGULATION  Recent Labs Lab 08/02/16 1547 08/02/16 2000 08/03/16 0045  INR 1.14 1.33 1.29    CARDIAC   Recent Labs Lab 08/02/16 2000  08/03/16 0046  TROPONINI 0.14* 0.44*   No results for input(s): PROBNP in the last 168 hours.   CHEMISTRY  Recent Labs Lab 07/31/16 2116 08/02/16 1547 08/02/16 1551 08/03/16 0046  NA 142 140 141 137  K 4.3 4.2 4.1 4.4  CL 105 106 104 108  CO2  --  24  --  20*  GLUCOSE 107* 150* 142* 246*  BUN 28* 21* 24* 23*  CREATININE 1.40* 1.56* 1.50* 1.70*  CALCIUM  --  8.9  --  8.0*  MG  --   --   --  1.8  PHOS  --   --   --  4.1   CrCl cannot be calculated (Unknown ideal weight.).   LIVER  Recent Labs Lab 08/02/16 1547 08/02/16 2000 08/03/16 0045  AST 20  --   --   ALT 13*  --   --   ALKPHOS 56  --   --   BILITOT 0.7  --   --   PROT 6.1*  --   --   ALBUMIN 3.2*  --   --   INR 1.14 1.33 1.29     INFECTIOUS  Recent Labs Lab 08/02/16 2000 08/03/16 0045 08/03/16 0046 08/03/16 0500  LATICACIDVEN  --  3.7*  --  2.4*  PROCALCITON 0.22  --  0.67  --      ENDOCRINE CBG (last 3)   Recent Labs  08/02/16 1550 08/02/16 2333  GLUCAP 134* 229*         IMAGING x48h  - image(s) personally visualized  -   highlighted in bold Ct Head Wo Contrast  Result Date: 08/03/2016 CLINICAL DATA:  82 year old male with altered mental status EXAM: CT HEAD WITHOUT CONTRAST TECHNIQUE: Contiguous axial images were obtained from the base of the skull through the vertex without intravenous contrast. COMPARISON:  None. FINDINGS: Brain: There is mild to moderate age-related atrophy and chronic microvascular ischemic changes. There is no acute intracranial hemorrhage. No mass effect or midline shift noted. Vascular: No hyperdense vessel or unexpected calcification. Skull: Normal. Negative for fracture or focal lesion. Sinuses/Orbits: No acute finding. Other: None IMPRESSION: No acute intracranial hemorrhage. Age-related atrophy and chronic microvascular ischemic disease. If symptoms persist and there are no contraindications, MRI may provide better evaluation if clinically indicated.  Electronically Signed   By: Anner Crete M.D.   On: 08/03/2016 06:52   Ct Angio Chest Pe W And/or Wo Contrast  Result Date: 08/03/2016 CLINICAL DATA:  There is no hilar or mediastinal adenopathy. The esophagus is grossly unremarkable. EXAM: CT ANGIOGRAPHY CHEST WITH CONTRAST TECHNIQUE: Multidetector CT imaging of the chest was performed using the standard protocol during bolus administration of intravenous contrast. Multiplanar CT image reconstructions and MIPs were obtained to evaluate the vascular anatomy. CONTRAST:  80 cc Isovue 370 COMPARISON:  Chest radiograph dated 08/02/2016 FINDINGS: Evaluation is limited due to streak artifact caused by patient's arms. Cardiovascular: There is a moderate atherosclerotic calcification of the thoracic aorta. No aneurysmal dilatation or evidence of dissection. Large bilateral pulmonary artery emboli  with involvement of the lobar branches of the right upper and right lower lobe as well as segmental branches of the left lower lobe. There is mild cardiomegaly. The right ventricle measures 61 mm in greatest diameter and the left ventricle measures 39 mm in greatest diameter compatible with a degree of right cardiac straining. Mediastinum/Nodes: There is no hilar or mediastinal adenopathy. The esophagus is grossly unremarkable. Lungs/Pleura: Small bilateral pleural effusions with partial compressive atelectasis of the lower lobes. Superimposed pneumonia is less likely but not excluded. There is no pneumothorax. Upper Abdomen: There is retrograde flow of contrast from the right atrium into the IVC compatible with a degree of right cardiac dysfunction. Correlation with echocardiogram recommended. A 3.0 x 3.5 cm hypoattenuating lesion arising from the superior pole of the left kidney is not characterized on this CT but appears similar to the cyst seen on the CT dated 08/13/2014. Small amount of fluid is noted adjacent to the spleen. Evaluation of this fluid is limited due to  streak artifact but this fluid appears hypo attenuating. Correlation with clinical exam recommended. Underlying splenic injury is not excluded. CT of the abdomen pelvis with better positioning of the patient to reduce streak artifact may provide better evaluation of the spleen and perisplenic fluid. Musculoskeletal: Osteopenia with multilevel degenerative changes of the spine. No acute fracture. Review of the MIP images confirms the above findings. IMPRESSION: Large bilateral pulmonary artery emboli with CT evidence of right heart strain (RV/LV Ratio = 1.6) consistent with at least submassive (intermediate risk) PE. The presence of right heart strain has been associated with an increased risk of morbidity and mortality. Please activate Code PE by paging [phone]. Small bilateral pleural effusions. Small amount of fluid adjacent to the spleen, incompletely characterized. Correlation with clinical exam recommended. CT of the abdomen pelvis may provide better evaluation of the spleen and perisplenic fluid. These results were called by telephone at the time of interpretation on 08/03/2016 at 7:03 am to nurse Casper Harrison who verbally acknowledged these results. Electronically Signed   By: Anner Crete M.D.   On: 08/03/2016 07:05   Dg Chest Portable 1 View  Result Date: 08/02/2016 CLINICAL DATA:  Altered mental status. EXAM: PORTABLE CHEST 1 VIEW COMPARISON:  07/31/2016 FINDINGS: 1549 hours. The cardio pericardial silhouette is enlarged. The lungs are clear wiithout focal pneumonia, edema, pneumothorax or pleural effusion. Interstitial markings are diffusely coarsened with chronic features. Calcified granuloma left mid lung again noted. The visualized bony structures of the thorax are intact. Telemetry leads overlie the chest. IMPRESSION: Cardiomegaly with chronic interstitial changes. No acute cardiopulmonary findings. Electronically Signed   By: Misty Stanley M.D.   On: 08/02/2016 16:26        DISCUSSION: 82 y.o. male who was brought to [facility] ED with hypotension, hypoxia, syncopal episode.  There was concern for PE; therefore, he was given empiric alteplase.  He was also started on neo-synephrine for persistent hypotension.  PCCM subsequently called for admission.  Of note, pt is a full DNR / DNI.  ASSESSMENT / PLAN:  PULMONARY A: Acute hypoxic respiratory failure - due to Massive PE - s/p alteplase empiric but CTA later cnfirmed PE  DO NOT INTUBATE status P:   Start heparin gtt per usual protocol. Assess echo, LE duplex. Continue supplemental O2 as needed to maintain SpO2 > 92%. BiPAP if needed for increased WOB.  CARDIOVASCULAR A:  Hypotension - unclear etiology.  Concern for obstructive shock from PE (but no clear evidence). Possible septic given that  he had fever with urinary retention just 2 days prior. Hx HTN.   - still on neo. Significant RV strain + P:  Continue neo-synephrine as needed for goal MAP > 65. Fluid bolus 500cc and reasss Assess cortisol, lactate, PCT. Trend troponin. Hold preadmission losartan.  RENAL A:   AKI. P:   NS @ 100. 1g Ca gluconate. BMP in AM.  GASTROINTESTINAL A:   Nutrition. P:   NPO.- advance diet  HEMATOLOGIC A:   S/p alteplase for possible PE. R/o DVT. P:  No SCD's until DVT ruled out. Heparin per protocol (s/p alteplase). CBC in AM.  INFECTIOUS A:   No clear evidence of infection - but can not rule out given fever + urinary retention 2 days prior. P:   Follow cultures as above. Assess PCT, if high then start empiric abx.  ENDOCRINE A:   No acute issues. P:   Monitor glucose on BMP.  NEUROLOGIC A:   Syncopal episode - ? PE. Acute encephalopathy. Hx dementia, anxiety.   - conversant and follwing commands P:   Avoid sedating meds. Hold preadmission duloxetine, gabapentin, lorazepam, memantine, risperidone.   Can consider re-starting in 1020/17  if more stable.  Family updated: le.   Daughter Dillon Bjork) called by EDP and updated.  Risks / Benefits of alteplase reportedly explained including possible fatal bleed and daughter willing to proceed (apparently there is a granddaughter who is a Nature conservation officer MD).  Daughter Lorna Few is currently flying to [place] from somewhere in [place]. Subsequtnntly family updated at admit  08/02/2016 by Dr Fayne Mediate  Interdisciplinary Family Meeting v Palliative Care Meeting:  DNR full medical with concurrent palliation   GLOBAL Kep in oicu 08/03/2016  Monitor for bleed     The patient is critically ill with multiple organ systems failure and requires high complexity decision making for assessment and support, frequent evaluation and titration of therapies, application of advanced monitoring technologies and extensive interpretation of multiple databases.   Critical Care Time devoted to patient care services described in this note is  30  Minutes. This time reflects time of care of this signee Dr Brand Males. This critical care time does not reflect procedure time, or teaching time or supervisory time of PA/NP/Med student/Med Resident etc but could involve care discussion time    Dr. Brand Males, M.D., [facility].C.P Pulmonary and Critical Care Medicine Staff Physician [place] [facility] Pager: [phone], If no answer or between  15:00h - 7:00h: call [phone]  08/03/2016 7:54 AM

## 2016-08-03 NOTE — Progress Notes (Signed)
eLink Physician-Brief Progress Note Patient Name: MAXMILLIAN HOCHSTEIN DOB: [birth year] MRN: [id number]   Date of Service  08/03/2016  HPI/Events of Note  Chest CTA reveals a small amount of fluid adjacent to the spleen, incompletely characterized. BP = 105/84 and last Hgb = 11.5.  eICU Interventions  Will check CBC now and Q 6 hours X 5. Will need to CT Scan Abdomen/Pelvis for drop in Hgb or hypotension to r/o bleeding.      Intervention Category Intermediate Interventions: Diagnostic test evaluation  Yvana Samonte Eugene 08/03/2016, 3:35 PM

## 2016-08-03 NOTE — Evaluation (Signed)
Clinical/Bedside Swallow Evaluation Patient Details  Name: [name] MRN: [id number] Date of Birth: 1920-10-30  Today's Date: 08/03/2016 Time: SLP Start Time (ACUTE ONLY): 0941 SLP Stop Time (ACUTE ONLY): 1005 SLP Time Calculation (min) (ACUTE ONLY): 24 min  Past Medical History:  Past Medical History:  Diagnosis Date  . Anxiety   . BPH (benign prostatic hypertrophy)   . Constipation, chronic   . Dementia    situational  . Diverticular hemorrhage 2014  . Frequency of urination   . Hemorrhoids    "lift chair helped"  . Hypertension   . Kidney stone on left side   . Pneumonia    hx of age 70  . Skin cancer   . 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 BLADDER SURGERY  1980's  . COLONOSCOPY N/A 01/28/2013   Procedure: COLONOSCOPY;  Surgeon: Jerene Bears, MD;  Location: [facility] ENDOSCOPY;  Service: Gastroenterology;  Laterality: N/A;  . CYSTOSCOPY WITH RETROGRADE PYELOGRAM, URETEROSCOPY AND STENT PLACEMENT Left 03/09/2015   Procedure: CYSTOSCOPY/LEFT URETEROSCOPY/ RETROGRADE PYLEGRAM HOLMIUM LASER, STONE  BASKET/STENT PLACEMENT;  Surgeon: Festus Aloe, MD;  Location: [facility] ORS;  Service: Urology;  Laterality: Left;  . HEMORRHOID SURGERY  1980's  . SKIN CANCER EXCISION     HPI:  82 y.o.malewho was brought to [facility] ED with hypotension, hypoxia, syncopal episode, concern for PE. PMH: transient global amnesia, dementia, anxiety, skin cancer, PNA, HTN   Assessment / Plan / Recommendation Clinical Impression  Pt has immediate coughing after straw sips of thin liquids, suspect related in part to reduced ability to coordinate breath/swallow given current respirations (RR fluctuating from the high 20s to 40). When RR is in the 20s, SLP provided pureed solids and nectar thick liquids with no overt signs of intolerance, even with larger boluses. Recommend to initiate Dys 1 diet and nectar thick liquids with careful  monitoring of respiratory status during intake. Pt will likely need to have frequent rest breaks and smaller, more frequent offerings of food/drink. SLP to f/u for tolerance and possible advancement.    Aspiration Risk  Mild aspiration risk;Moderate aspiration risk    Diet Recommendation Dysphagia 1 (Puree);Nectar-thick liquid   Liquid Administration via: Cup;Straw Medication Administration: Crushed with puree Supervision: Patient able to self feed;Full supervision/cueing for compensatory strategies Compensations: Minimize environmental distractions;Slow rate;Small sips/bites Postural Changes: Seated upright at 90 degrees;Remain upright for at least 30 minutes after po intake    Other  Recommendations Oral Care Recommendations: Oral care BID Other Recommendations: Order thickener from pharmacy;Prohibited food (jello, ice cream, thin soups);Remove water pitcher   Follow up Recommendations  (tba)      Frequency and Duration min 2x/week  2 weeks       Prognosis Prognosis for Safe Diet Advancement: Good      Swallow Study   General HPI: 82 y.o.malewho was brought to [facility] ED with hypotension, hypoxia, syncopal episode, concern for PE. PMH: transient global amnesia, dementia, anxiety, skin cancer, PNA, HTN Type of Study: Bedside Swallow Evaluation Previous Swallow Assessment: none in chart Diet Prior to this Study: NPO Temperature Spikes Noted: No Respiratory Status: Nasal cannula History of Recent Intubation: No Behavior/Cognition: Alert;Cooperative;Pleasant mood;Confused;Requires cueing;Other (Comment) (HOH) Oral Cavity Assessment: Within Functional Limits Oral Care Completed by SLP: No Oral Cavity - Dentition: Poor condition Patient Positioning: Upright in bed Baseline Vocal Quality: Normal Volitional Cough: Strong    Oral/Motor/Sensory Function     Ice Chips  Ice chips: Within functional limits Presentation: Spoon   Thin Liquid Thin Liquid: Impaired Presentation:  Spoon;Straw Pharyngeal  Phase Impairments: Cough - Immediate    Nectar Thick Nectar Thick Liquid: Within functional limits Presentation: Straw   Honey Thick Honey Thick Liquid: Not tested   Puree Puree: Within functional limits Presentation: Spoon   Solid   GO   Solid: Not tested        [name] 08/03/2016,10:54 AM  [name], M.A. CCC-SLP [phone]

## 2016-08-03 NOTE — Progress Notes (Signed)
ANTICOAGULATION CONSULT NOTE - Follow Up Consult  Pharmacy Consult for Heparin Indication: pulmonary embolus  Allergies  Allergen Reactions  . Amoxicillin Diarrhea  . Codeine Other (See Comments)  . Feldene [Piroxicam] Other (See Comments)  . Indocin [Indomethacin] Other (See Comments)  . Morphine And Related Nausea And Vomiting  . Other     omnicor- unknown reaction  . Uroxatral [Alfuzosin Hcl Er] Other (See Comments)    Patient Measurements: Weight: 173 lb 15.1 oz (78.9 kg)  Height 183 cm Ideal Body weight: 77.7 kg Heparin Dosing Weight: actual  Vital Signs: Temp: 98.2 F (36.8 C) (10/19 0802) Temp Source: Oral (10/19 0802) BP: 105/84 (10/19 0500) Pulse Rate: 76 (10/19 0500)  Labs:  Recent Labs  08/02/16 1547 08/02/16 1551 08/02/16 1850 08/02/16 2000 08/03/16 0045 08/03/16 0046 08/03/16 0823  HGB 14.0 15.3 13.2  --   --  13.0  --   HCT 43.5 45.0 41.5  --   --  40.8  --   PLT 176  --  171  --   --  163  --   APTT 31  --   --  50* 53*  --   --   LABPROT 14.6  --   --  16.6* 16.2*  --   --   INR 1.14  --   --  1.33 1.29  --   --   HEPARINUNFRC  --   --   --   --   --   --  0.21*  CREATININE 1.56* 1.50*  --   --   --  1.70*  --   TROPONINI  --   --   --  0.14*  --  0.44*  --     CrCl cannot be calculated (Unknown ideal weight.).   Medications:  Infusions:  . sodium chloride 100 mL/hr at 08/02/16 2210  . heparin 900 Units/hr (08/02/16 2249)  . phenylephrine (NEO-SYNEPHRINE) Adult infusion 10 mcg/min (08/03/16 [id number])    Assessment: 82 year old male s/p tPA in ED on 10/18 then started on IV heparin after aPTT fell below 80. Last aPTT 53.   Heparin level this morning is sub-therapeutic at 0.21 on 900 units/hr. INR 1.29 and Trop up 0.44 (up post tPA). CBC remains stable. No over bleeding per RN. No change in mental status from baseline dementia.   Goal of Therapy:  Heparin level 0.3-0.7 units/ml Monitor platelets by anticoagulation protocol: Yes   Plan:   Increase heparin to 1100 units/hr (NO bolus). Heparin level in 8 hours Daily heparin level and CBC Monitor closely for signs and symptoms of bleeding.   Sloan Leiter, PharmD, BCPS Clinical Pharmacist [phone] 08/03/2016,8:44 AM

## 2016-08-03 NOTE — Progress Notes (Signed)
Called Dr Chase Caller to report lactic acid 3.7 and troponin 0.44.

## 2016-08-03 NOTE — Progress Notes (Signed)
  Echocardiogram 2D Echocardiogram has been performed.  Darlina Sicilian M 08/03/2016, 12:39 AM

## 2016-08-03 NOTE — Progress Notes (Signed)
Received pt from ED, pt calm resting/sleeping on right side rr unlabored NRB face mask intact, heparin drip and neo drip infusing LPIV, c/o being cold when aroused by nursing care, warm blankets applied,

## 2016-08-03 NOTE — Progress Notes (Signed)
*  Preliminary Results* Bilateral lower extremity venous duplex completed. The right lower extremity is negative for deep vein thrombosis. The left lower extremity is positive for deep vein thrombosis involving the left posterior tibial and peroneal veins. There is no evidence of Baker's cyst bilaterally.  Preliminary results discussed with Albin Felling, RN.  08/03/2016 12:28 PM Maudry Mayhew, BS, RVT, RDCS, RDMS

## 2016-08-03 NOTE — Progress Notes (Addendum)
[place] for Heparin Indication: pulmonary embolus  Allergies  Allergen Reactions  . Amoxicillin Diarrhea  . Codeine Other (See Comments)  . Feldene [Piroxicam] Other (See Comments)  . Indocin [Indomethacin] Other (See Comments)  . Morphine And Related Nausea And Vomiting  . Other     omnicor- unknown reaction  . Uroxatral [Alfuzosin Hcl Er] Other (See Comments)    Patient Measurements: Weight: 173 lb 15.1 oz (78.9 kg)  Height 183 cm Ideal Body weight: 77.7 kg Heparin Dosing Weight: actual  Vital Signs: Temp: 98.1 F (36.7 C) (10/19 1547) Temp Source: Oral (10/19 1547)  Labs:  Recent Labs  08/02/16 1547 08/02/16 1551  08/02/16 2000 08/03/16 0045 08/03/16 0046 08/03/16 0823 08/03/16 0912 08/03/16 1614  HGB 14.0 15.3  < >  --   --  13.0  --  11.5* 12.1*  HCT 43.5 45.0  < >  --   --  40.8  --  35.6* 37.7*  PLT 176  --   < >  --   --  163  --  153 149*  APTT 31  --   --  50* 53*  --   --   --   --   LABPROT 14.6  --   --  16.6* 16.2*  --   --   --   --   INR 1.14  --   --  1.33 1.29  --   --   --   --   HEPARINUNFRC  --   --   --   --   --   --  0.21*  --  0.19*  CREATININE 1.56* 1.50*  --   --   --  1.70*  --   --   --   TROPONINI  --   --   --  0.14*  --  0.44*  --   --   --   < > = values in this interval not displayed.  CrCl cannot be calculated (Unknown ideal weight.).   Medications:  Infusions:  . sodium chloride 100 mL/hr at 08/03/16 0852  . heparin 1,100 Units/hr (08/03/16 1004)  . phenylephrine (NEO-SYNEPHRINE) Adult infusion Stopped (08/03/16 1330)    Assessment: 82 year old male s/p tPA in ED on 10/18 then started on IV heparin after aPTT fell below 80. Last aPTT 53.   Heparin level this morning is sub-therapeutic at 0.21 on 900 units/hr. INR 1.29 and Trop up 0.44 (up post tPA). CBC remains stable. No over bleeding per RN. No change in mental status from baseline dementia.   Heparin level is subtherapeutic.  No issues with infusion per RN. MD notes some fluid collection near the spleen, monitor hgb and s/sx bleeding closely.    Goal of Therapy:  Heparin level 0.3-0.7 units/ml Monitor platelets by anticoagulation protocol: Yes    Plan:  -Increase heparin to 1300 units/hr -Daily HL, CBC -Next level with AM labs   Hughes Better, PharmD, BCPS Clinical Pharmacist 08/03/2016 5:54 PM

## 2016-08-04 LAB — CBC
HCT: 37.1 % — ABNORMAL LOW (ref 39.0–52.0)
HCT: 36.7 % — ABNORMAL LOW (ref 39.0–52.0)
HCT: 34.6 % — ABNORMAL LOW (ref 39.0–52.0)
HEMATOCRIT: 37 % — AB (ref 39.0–52.0)
HEMOGLOBIN: 11.9 g/dL — AB (ref 13.0–17.0)
HEMOGLOBIN: 11.1 g/dL — AB (ref 13.0–17.0)
Hemoglobin: 11.9 g/dL — ABNORMAL LOW (ref 13.0–17.0)
Hemoglobin: 12 g/dL — ABNORMAL LOW (ref 13.0–17.0)
MCH: 27.3 pg (ref 26.0–34.0)
MCH: 27.4 pg (ref 26.0–34.0)
MCH: 27.1 pg (ref 26.0–34.0)
MCH: 27.3 pg (ref 26.0–34.0)
MCHC: 32.1 g/dL (ref 30.0–36.0)
MCHC: 32.4 g/dL (ref 30.0–36.0)
MCHC: 32.1 g/dL (ref 30.0–36.0)
MCHC: 32.4 g/dL (ref 30.0–36.0)
MCV: 85.1 fL (ref 78.0–100.0)
MCV: 84.4 fL (ref 78.0–100.0)
MCV: 84.6 fL (ref 78.0–100.0)
MCV: 84.3 fL (ref 78.0–100.0)
PLATELETS: 151 10*3/uL (ref 150–400)
PLATELETS: 151 10*3/uL (ref 150–400)
PLATELETS: 158 10*3/uL (ref 150–400)
PLATELETS: 199 10*3/uL (ref 150–400)
RBC: 4.36 MIL/uL (ref 4.22–5.81)
RBC: 4.35 MIL/uL (ref 4.22–5.81)
RBC: 4.09 MIL/uL — AB (ref 4.22–5.81)
RBC: 4.39 MIL/uL (ref 4.22–5.81)
RDW: 13.8 % (ref 11.5–15.5)
RDW: 13.7 % (ref 11.5–15.5)
RDW: 13.8 % (ref 11.5–15.5)
RDW: 13.8 % (ref 11.5–15.5)
WBC: 10.7 10*3/uL — ABNORMAL HIGH (ref 4.0–10.5)
WBC: 11.1 10*3/uL — AB (ref 4.0–10.5)
WBC: 10.4 10*3/uL (ref 4.0–10.5)
WBC: 11.7 10*3/uL — ABNORMAL HIGH (ref 4.0–10.5)

## 2016-08-04 LAB — BASIC METABOLIC PANEL
Anion gap: 7 (ref 5–15)
BUN: 20 mg/dL (ref 6–20)
CHLORIDE: 114 mmol/L — AB (ref 101–111)
CO2: 19 mmol/L — AB (ref 22–32)
CREATININE: 1.28 mg/dL — AB (ref 0.61–1.24)
Calcium: 8.3 mg/dL — ABNORMAL LOW (ref 8.9–10.3)
GFR calc Af Amer: 54 mL/min — ABNORMAL LOW (ref >60)
GFR calc non Af Amer: 46 mL/min — ABNORMAL LOW (ref >60)
Glucose, Bld: 100 mg/dL — ABNORMAL HIGH (ref 65–99)
POTASSIUM: 4.2 mmol/L (ref 3.5–5.1)
SODIUM: 140 mmol/L (ref 135–145)

## 2016-08-04 LAB — HEPATIC FUNCTION PANEL
ALBUMIN: 2.9 g/dL — AB (ref 3.5–5.0)
ALT: 64 U/L — AB (ref 17–63)
AST: 48 U/L — AB (ref 15–41)
Alkaline Phosphatase: 73 U/L (ref 38–126)
BILIRUBIN DIRECT: 0.2 mg/dL (ref 0.1–0.5)
BILIRUBIN TOTAL: 0.4 mg/dL (ref 0.3–1.2)
Indirect Bilirubin: 0.2 mg/dL — ABNORMAL LOW (ref 0.3–0.9)
Total Protein: 5.4 g/dL — ABNORMAL LOW (ref 6.5–8.1)

## 2016-08-04 LAB — LACTIC ACID, PLASMA: LACTIC ACID, VENOUS: 1.2 mmol/L (ref 0.5–1.9)

## 2016-08-04 LAB — HEPARIN LEVEL (UNFRACTIONATED)
Heparin Unfractionated: 0.48 IU/mL (ref 0.30–0.70)
Heparin Unfractionated: 0.35 IU/mL (ref 0.30–0.70)

## 2016-08-04 LAB — PROCALCITONIN: PROCALCITONIN: 0.84 ng/mL

## 2016-08-04 LAB — GLUCOSE, CAPILLARY
GLUCOSE-CAPILLARY: 93 mg/dL (ref 65–99)
GLUCOSE-CAPILLARY: 120 mg/dL — AB (ref 65–99)
Glucose-Capillary: 101 mg/dL — ABNORMAL HIGH (ref 65–99)
Glucose-Capillary: 105 mg/dL — ABNORMAL HIGH (ref 65–99)

## 2016-08-04 LAB — PHOSPHORUS: Phosphorus: 2.9 mg/dL (ref 2.5–4.6)

## 2016-08-04 LAB — TROPONIN I: Troponin I: 0.46 ng/mL (ref <0.03)

## 2016-08-04 LAB — MAGNESIUM: MAGNESIUM: 2.2 mg/dL (ref 1.7–2.4)

## 2016-08-04 MED ORDER — DULOXETINE HCL 60 MG PO CPEP
60.0000 mg | ORAL_CAPSULE | Freq: Every day | ORAL | Status: DC
  Administered 2016-08-04 – 2016-08-09 (×6): 60 mg via ORAL
  Filled 2016-08-04 (×6): qty 1

## 2016-08-04 MED ORDER — GABAPENTIN 100 MG PO CAPS
100.0000 mg | ORAL_CAPSULE | Freq: Once | ORAL | Status: AC
  Administered 2016-08-04: 100 mg via ORAL

## 2016-08-04 MED ORDER — GABAPENTIN 100 MG PO CAPS
100.0000 mg | ORAL_CAPSULE | Freq: Three times a day (TID) | ORAL | Status: DC
  Administered 2016-08-04 – 2016-08-09 (×15): 100 mg via ORAL
  Filled 2016-08-04 (×16): qty 1

## 2016-08-04 MED ORDER — ALTEPLASE (PULMONARY EMBOLISM) INFUSION: 50.0000 mg | Freq: Once | INTRAVENOUS | Status: DC

## 2016-08-04 MED ORDER — ALPRAZOLAM 0.25 MG PO TABS
0.2500 mg | ORAL_TABLET | Freq: Once | ORAL | Status: DC
  Filled 2016-08-04: qty 1

## 2016-08-04 MED ORDER — TAMSULOSIN HCL 0.4 MG PO CAPS
0.4000 mg | ORAL_CAPSULE | Freq: Every day | ORAL | Status: DC
  Administered 2016-08-04 – 2016-08-09 (×6): 0.4 mg via ORAL
  Filled 2016-08-04 (×6): qty 1

## 2016-08-04 MED ORDER — HALOPERIDOL LACTATE 5 MG/ML IJ SOLN
INTRAMUSCULAR | Status: AC
  Filled 2016-08-04: qty 1

## 2016-08-04 MED ORDER — CHOLESTYRAMINE LIGHT 4 G PO PACK
4.0000 g | PACK | Freq: Two times a day (BID) | ORAL | Status: AC
  Administered 2016-08-04 – 2016-08-05 (×2): 4 g via ORAL
  Filled 2016-08-04 (×2): qty 1

## 2016-08-04 MED ORDER — IBUPROFEN 100 MG/5ML PO SUSP
200.0000 mg | Freq: Four times a day (QID) | ORAL | Status: DC | PRN
  Filled 2016-08-04 (×2): qty 10

## 2016-08-04 MED ORDER — LOSARTAN POTASSIUM 50 MG PO TABS
50.0000 mg | ORAL_TABLET | Freq: Every day | ORAL | Status: DC
  Administered 2016-08-04 – 2016-08-09 (×6): 50 mg via ORAL
  Filled 2016-08-04 (×6): qty 1

## 2016-08-04 MED ORDER — HALOPERIDOL LACTATE 5 MG/ML IJ SOLN
3.0000 mg | Freq: Once | INTRAMUSCULAR | Status: AC
  Administered 2016-08-04: 3 mg via INTRAVENOUS

## 2016-08-04 MED ORDER — LORAZEPAM 2 MG/ML IJ SOLN
0.5000 mg | Freq: Once | INTRAMUSCULAR | Status: AC
  Administered 2016-08-04: 0.5 mg via INTRAVENOUS
  Filled 2016-08-04: qty 1

## 2016-08-04 MED ORDER — SODIUM CHLORIDE 0.9 % IV SOLN: 250.0000 mL | Freq: Once | INTRAVENOUS | Status: DC

## 2016-08-04 MED ORDER — HYDRALAZINE HCL 20 MG/ML IJ SOLN
5.0000 mg | INTRAMUSCULAR | Status: DC | PRN
  Administered 2016-08-04: 5 mg via INTRAVENOUS
  Filled 2016-08-04: qty 1

## 2016-08-04 MED ORDER — RISPERIDONE 0.5 MG PO TBDP
0.5000 mg | ORAL_TABLET | Freq: Every day | ORAL | Status: DC
  Administered 2016-08-04 – 2016-08-08 (×5): 0.5 mg via ORAL
  Filled 2016-08-04 (×5): qty 1

## 2016-08-04 MED ORDER — LORAZEPAM 0.5 MG PO TABS
0.2500 mg | ORAL_TABLET | Freq: Three times a day (TID) | ORAL | Status: DC | PRN
  Administered 2016-08-04 – 2016-08-08 (×10): 0.5 mg via ORAL
  Filled 2016-08-04 (×11): qty 1

## 2016-08-04 MED ORDER — RESOURCE THICKENUP CLEAR PO POWD
ORAL | Status: DC | PRN
  Filled 2016-08-04: qty 125

## 2016-08-04 MED ORDER — MEMANTINE HCL ER 28 MG PO CP24
28.0000 mg | ORAL_CAPSULE | Freq: Every day | ORAL | Status: DC
  Administered 2016-08-04 – 2016-08-09 (×6): 28 mg via ORAL
  Filled 2016-08-04 (×6): qty 1

## 2016-08-04 NOTE — Progress Notes (Signed)
Patient c/o pain on lower back area that is new today,cramping abdominal pain and pain on his rectum that is on going,pt does not have any pain med at this time. Call made to [facility] call back. Daughter and pt's caregiver in the room. Baby Gieger, Wonda Cheng, Therapist, sports

## 2016-08-04 NOTE — Progress Notes (Signed)
[place] for Heparin Indication: pulmonary embolus  Allergies  Allergen Reactions  . Amoxicillin Diarrhea  . Codeine Other (See Comments)  . Feldene [Piroxicam] Other (See Comments)  . Indocin [Indomethacin] Other (See Comments)  . Morphine And Related Nausea And Vomiting  . Other     omnicor- unknown reaction  . Uroxatral [Alfuzosin Hcl Er] Other (See Comments)    Patient Measurements: Weight: 173 lb 15.1 oz (78.9 kg)  Height 183 cm Ideal Body weight: 77.7 kg Heparin Dosing Weight: actual  Vital Signs: Temp: 97.4 F (36.3 C) (10/20 0340) Temp Source: Oral (10/20 0340) BP: 180/100 (10/20 0005) Pulse Rate: 101 (10/20 0005)  Labs:  Recent Labs  08/02/16 1547 08/02/16 1551  08/02/16 2000 08/03/16 0045 08/03/16 0046 08/03/16 0823  08/03/16 1614 08/03/16 2231 08/04/16 0307  HGB 14.0 15.3  < >  --   --  13.0  --   < > 12.1* 11.9* 11.9*  HCT 43.5 45.0  < >  --   --  40.8  --   < > 37.7* 37.1* 36.7*  PLT 176  --   < >  --   --  163  --   < > 149* 151 151  APTT 31  --   --  50* 53*  --   --   --   --   --   --   LABPROT 14.6  --   --  16.6* 16.2*  --   --   --   --   --   --   INR 1.14  --   --  1.33 1.29  --   --   --   --   --   --   HEPARINUNFRC  --   --   --   --   --   --  0.21*  --  0.19*  --  0.48  CREATININE 1.56* 1.50*  --   --   --  1.70*  --   --   --   --  1.28*  TROPONINI  --   --   --  0.14*  --  0.44*  --   --   --   --  0.46*  < > = values in this interval not displayed.  CrCl cannot be calculated (Unknown ideal weight.).   Medications:  Infusions:  . sodium chloride 100 mL/hr at 08/03/16 2000  . heparin 1,300 Units/hr (08/03/16 2000)  . phenylephrine (NEO-SYNEPHRINE) Adult infusion Stopped (08/03/16 1330)    Assessment: 82 year old male s/p tPA in ED on 10/18 then started on IV heparin for PE.  Heparin level therapeutic (0.48) on gtt at 1300 units/hr. CBC stable.  Goal of Therapy:  Heparin level  0.3-0.7 units/ml Monitor platelets by anticoagulation protocol: Yes    Plan:  Continue heparin to 1300 units/hr Will f/u 6h confirmatory heparin level  Sherlon Handing, PharmD, BCPS Clinical pharmacist, pager [phone] 08/04/2016 5:03 AM

## 2016-08-04 NOTE — Progress Notes (Signed)
eLink Physician-Brief Progress Note Patient Name: [name] DOB: December 28, 1920 MRN: [id number]   Date of Service  08/04/2016  HPI/Events of Note  Multiple issues: 1. Liquid/semi solid stools. 2. Low back pain, 3. Anxiety and 4. Sister wants home Risperdal restarted.   eICU Interventions  Will order: 1. Questran 4 gm PO X 2 doses. 2. Motrin Liquid 200 mg PO Q 6 hours PRN pain. 3. Xanax 0.5 mg PO X 1. 4. Risperdal 0.5 mg PO Q PM.     Intervention Category Intermediate Interventions: Pain - evaluation and management Minor Interventions: Agitation / anxiety - evaluation and management  Nezzie Manera [place] 08/04/2016, 5:41 PM

## 2016-08-04 NOTE — Progress Notes (Signed)
Spoke to Dr. Sonia Side made aware of pt's multiple c/o of pain and restlessness.MD also made aware of patient having loose stools with some solid particles 3-4x today with abdominal cramping pain. Daughter also has questions regarding pt's home medications that he hasn't been getting since being admitted.Order received. Daughter made aware of MD order. Per daughter,pt does not take motrin because of his history of bleeding and he also does not take xanax.Daughter prefers ativan that pt has been taking.Another call made to Dr. Oletta Darter and relayed daughter's concerns. Per MD,another MD will come see patient around 8pm. Pt's family made aware. Tresea Heine, Wonda Cheng, Therapist, sports

## 2016-08-04 NOTE — Progress Notes (Signed)
PULMONARY / CRITICAL CARE MEDICINE   Name: [name] MRN: [id number] DOB: 1920-11-14    ADMISSION DATE:  08/02/2016 CONSULTATION DATE:  08/02/16  REFERRING MD:  [place] - EDP  CHIEF COMPLAINT:  Syncope, AMS, Hypoxia, Hypotension  [name] is a 82 y.o. male with PMH as outlined below and who resides at home but has aide's to assist him with ADL's (though they report he is fairly independent).  He was brought to [facility] ED 10/18 with generalized weakness and AMS following a witnessed syncopal episode.  Following this, he began to have jerk like movements. He apparently has been feeling weak for a week or so along with decreased appetite.  No known chills/sweats, headaches, chest pain, SOB, N/V/D, abd pain, myalgias.  He did have fevers 2 days prior for which he was seen in ED.  He was found to have urinary retention and was discharged with instructions to f/u with urology.  On EMS arrival, pt was hypotensive with SBP in 70's.  He also complained stating "I can't breathe".    In ED, he remained hypotensive and was found to be hypoxic. He was tachycardic in the one-teens and EKG showed t-wave inversions in multiple leads.  Due to the constellation of findings, bedside echo was performed by EDP.  Per report, this showed hypodynamic RV with possible McConnell's sign.  Pt was subsequently given empiric alteplase for presumed PE.  He was also started on neo-synephrine for persistent hypotension.  PCCM was subsequently called for admission.  Of note, pt is a full DNR / DNI which was confirmed with family by EDP.  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  PAST SURGICAL HISTORY: He  has a past surgical history that  includes Colonoscopy (N/A, 01/28/2013); Skin cancer excision; Hemorrhoid surgery (1980's); Bladder surgery (1980's); and Cystoscopy with retrograde pyelogram, ureteroscopy and stent placement (Left, 03/09/2015).    STUDIES:  CXR 10/18 > no acute process.  CULTURES: Blood 10/18 > Urine 10/18 >  ANTIBIOTICS: None.  SIGNIFICANT EVENTS: 10/18 > admitted with concern for PE > given empiric alteplase in ED.  Started on neo-synephrine for hypotension.  08/03/16 - call from Dr Radford Pax cards - echo with severe RV dysfn . LV likely ok. Currently mronign rounds was sleeping. Caregiver at bedside. Denied any issues .  Creat worse. Mag < 2. Trop 0.44 Lactate > 2 but better since admit. Still on neo. RN reports hyperglycemia    SUBJECTIVE/OVERNIGHT/INTERVAL HX 10/20/'17 - foley from prior admit +. On 6LNC -> pulse ox 100%. BP good. Alert but has disorienteatin due to dementia - home sitter at bedside. Wants to eat - approved for d1 diet only. RN thinks meets ICU transfer out criteria./ No bleed. No neuro deficits  VITAL SIGNS: BP 123/76   Pulse 86   Temp 97.8 F (36.6 C) (Oral)   Resp 19   Wt 79 kg (174 lb 2.6 oz)   SpO2 100%   BMI 23.62 kg/m   HEMODYNAMICS:    VENTILATOR SETTINGS:    INTAKE / OUTPUT: I/O last 3 completed shifts: In: 7674.8 [I.V.:6074.8; IV Piggyback:1600] Out: [id number] [Urine:1505]   PHYSICAL EXAMINATION: General: Elderly male, chronically ill appearing,  Neuro: awake and confused HEENT: [name]. PERRL, sclerae anicteric. Cardiovascular: Tachy, irregular, no M/R/G.  Lungs: Respirations even and unlabored.  CTA bilaterally, No W/R/R. Abdomen: BS x 4, soft, NT/ND.  Musculoskeletal: No gross deformities, no edema.  GU: foley +  Skin: Intact, warm, no rashes.  LABS: PULMONARY  Recent Labs Lab 07/31/16 2116 08/02/16 1551  TCO2 27 22    CBC  Recent Labs Lab 08/03/16 2231 08/04/16 0307 08/04/16 0942  HGB 11.9* 11.9* 11.1*  HCT 37.1* 36.7* 34.6*  WBC  10.7* 11.1* 10.4  PLT 151 151 158    COAGULATION  Recent Labs Lab 08/02/16 1547 08/02/16 2000 08/03/16 0045  INR 1.14 1.33 1.29    CARDIAC    Recent Labs Lab 08/02/16 2000 08/03/16 0046 08/04/16 0307  TROPONINI 0.14* 0.44* 0.46*   No results for input(s): PROBNP in the last 168 hours.   CHEMISTRY  Recent Labs Lab 07/31/16 2116 08/02/16 1547 08/02/16 1551 08/03/16 0046 08/04/16 0307  NA 142 140 141 137 140  K 4.3 4.2 4.1 4.4 4.2  CL 105 106 104 108 114*  CO2  --  24  --  20* 19*  GLUCOSE 107* 150* 142* 246* 100*  BUN 28* 21* 24* 23* 20  CREATININE 1.40* 1.56* 1.50* 1.70* 1.28*  CALCIUM  --  8.9  --  8.0* 8.3*  MG  --   --   --  1.8 2.2  PHOS  --   --   --  4.1 2.9   CrCl cannot be calculated (Unknown ideal weight.).   LIVER  Recent Labs Lab 08/02/16 1547 08/02/16 2000 08/03/16 0045 08/04/16 0307  AST 20  --   --  48*  ALT 13*  --   --  64*  ALKPHOS 56  --   --  73  BILITOT 0.7  --   --  0.4  PROT 6.1*  --   --  5.4*  ALBUMIN 3.2*  --   --  2.9*  INR 1.14 1.33 1.29  --      INFECTIOUS  Recent Labs Lab 08/02/16 2000 08/03/16 0045 08/03/16 0046 08/03/16 0500 08/03/16 2231 08/04/16 0307  LATICACIDVEN  --  3.7*  --  2.4* 1.2  --   PROCALCITON 0.22  --  0.67  --   --  0.84     ENDOCRINE CBG (last 3)   Recent Labs  08/04/16 0342 08/04/16 0741 08/04/16 1117  GLUCAP 101* 105* 93         IMAGING x48h  - image(s) personally visualized  -   highlighted in bold Ct Head Wo Contrast  Result Date: 08/03/2016 CLINICAL DATA:  82 year old male with altered mental status EXAM: CT HEAD WITHOUT CONTRAST TECHNIQUE: Contiguous axial images were obtained from the base of the skull through the vertex without intravenous contrast. COMPARISON:  None. FINDINGS: Brain: There is mild to moderate age-related atrophy and chronic microvascular ischemic changes. There is no acute intracranial hemorrhage. No mass effect or midline shift noted.  Vascular: No hyperdense vessel or unexpected calcification. Skull: Normal. Negative for fracture or focal lesion. Sinuses/Orbits: No acute finding. Other: None IMPRESSION: No acute intracranial hemorrhage. Age-related atrophy and chronic microvascular ischemic disease. If symptoms persist and there are no contraindications, MRI may provide better evaluation if clinically indicated. Electronically Signed   By: Anner Crete M.D.   On: 08/03/2016 06:52   Ct Angio Chest Pe W And/or Wo Contrast  Result Date: 08/03/2016 CLINICAL DATA:  There is no hilar or mediastinal adenopathy. The esophagus is grossly unremarkable. EXAM: CT ANGIOGRAPHY CHEST WITH CONTRAST TECHNIQUE: Multidetector  CT imaging of the chest was performed using the standard protocol during bolus administration of intravenous contrast. Multiplanar CT image reconstructions and MIPs were obtained to evaluate the vascular anatomy. CONTRAST:  80 cc Isovue 370 COMPARISON:  Chest radiograph dated 08/02/2016 FINDINGS: Evaluation is limited due to streak artifact caused by patient's arms. Cardiovascular: There is a moderate atherosclerotic calcification of the thoracic aorta. No aneurysmal dilatation or evidence of dissection. Large bilateral pulmonary artery emboli with involvement of the lobar branches of the right upper and right lower lobe as well as segmental branches of the left lower lobe. There is mild cardiomegaly. The right ventricle measures 61 mm in greatest diameter and the left ventricle measures 39 mm in greatest diameter compatible with a degree of right cardiac straining. Mediastinum/Nodes: There is no hilar or mediastinal adenopathy. The esophagus is grossly unremarkable. Lungs/Pleura: Small bilateral pleural effusions with partial compressive atelectasis of the lower lobes. Superimposed pneumonia is less likely but not excluded. There is no pneumothorax. Upper Abdomen: There is retrograde flow of contrast from the right atrium into the  IVC compatible with a degree of right cardiac dysfunction. Correlation with echocardiogram recommended. A 3.0 x 3.5 cm hypoattenuating lesion arising from the superior pole of the left kidney is not characterized on this CT but appears similar to the cyst seen on the CT dated 08/13/2014. Small amount of fluid is noted adjacent to the spleen. Evaluation of this fluid is limited due to streak artifact but this fluid appears hypo attenuating. Correlation with clinical exam recommended. Underlying splenic injury is not excluded. CT of the abdomen pelvis with better positioning of the patient to reduce streak artifact may provide better evaluation of the spleen and perisplenic fluid. Musculoskeletal: Osteopenia with multilevel degenerative changes of the spine. No acute fracture. Review of the MIP images confirms the above findings. IMPRESSION: Large bilateral pulmonary artery emboli with CT evidence of right heart strain (RV/LV Ratio = 1.6) consistent with at least submassive (intermediate risk) PE. The presence of right heart strain has been associated with an increased risk of morbidity and mortality. Please activate Code PE by paging [phone]. Small bilateral pleural effusions. Small amount of fluid adjacent to the spleen, incompletely characterized. Correlation with clinical exam recommended. CT of the abdomen pelvis may provide better evaluation of the spleen and perisplenic fluid. These results were called by telephone at the time of interpretation on 08/03/2016 at 7:03 am to nurse Casper Harrison who verbally acknowledged these results. Electronically Signed   By: Anner Crete M.D.   On: 08/03/2016 07:05   Dg Chest Port 1 View  Result Date: 08/03/2016 CLINICAL DATA:  Respiratory failure and shortness of breath, known pulmonary emboli EXAM: PORTABLE CHEST 1 VIEW COMPARISON:  08/02/2016 FINDINGS: Cardiac shadow remains enlarged in size. Aortic calcifications are again noted. Mild interstitial changes are again  seen. No focal confluent infiltrate is noted. No acute bony abnormality is noted. IMPRESSION: Stable appearance of the chest with mild interstitial changes. No focal infiltrate is seen. Electronically Signed   By: Inez Catalina M.D.   On: 08/03/2016 07:48   Dg Chest Portable 1 View  Result Date: 08/02/2016 CLINICAL DATA:  Altered mental status. EXAM: PORTABLE CHEST 1 VIEW COMPARISON:  07/31/2016 FINDINGS: 1549 hours. The cardio pericardial silhouette is enlarged. The lungs are clear wiithout focal pneumonia, edema, pneumothorax or pleural effusion. Interstitial markings are diffusely coarsened with chronic features. Calcified granuloma left mid lung again noted. The visualized bony structures of the thorax are intact. Telemetry leads overlie  the chest. IMPRESSION: Cardiomegaly with chronic interstitial changes. No acute cardiopulmonary findings. Electronically Signed   By: Misty Stanley M.D.   On: 08/02/2016 16:26       DISCUSSION: 82 y.o. male who was brought to [facility] ED with hypotension, hypoxia, syncopal episode.  There was concern for PE; therefore, he was given empiric alteplase.  He was also started on neo-synephrine for persistent hypotension.  PCCM subsequently called for admission.  Of note, pt is a full DNR / DNI.  ASSESSMENT / PLAN:  PULMONARY A: Acute hypoxic respiratory failure - due to Massive PE - s/p alteplase empiric but CTA later cnfirmed PE  DO NOT INTUBATE status   08/04/2016  - much better. On 6L [place] with pusle ox 100%  P:   heparin gtt per usual protocol. aWait LE duplex. Do IV heparin for 3-5 days total since admit 08/02/2016 before doing oral agent (coumadin v noac based on triad) Continue supplemental O2 as needed to maintain SpO2 > 92%.   CARDIOVASCULAR A:  Hx HTN.  @admit  with shock due to PE   -08/04/2016 - off neo P:  Dc neo-synephrine  goal MAP > 65. Hold preadmission losartan for now  RENAL A:   AKI. - improved  P:   BMP in AM. Saline  lock Home with foley till sees urologist (done at prior admit)  GASTROINTESTINAL A:   Nutrition. P:   d1 diet only per speech  HEMATOLOGIC A:   S/p alteplase for possible PE 08/02/2016 R/o DVT.  P:  Heparin per protocol (s/p alteplase). CBC in AM.  INFECTIOUS A:   No clear evidence of infection  P:   Anti-infectives    None       ENDOCRINE A:   No acute issues. P:   Monitor glucose on BMP.  NEUROLOGIC A:   Syncopal episode - ? PE. Acute encephalopathy. Hx dementia, anxiety.   - conversant and follwing commands but confused - sitter says baseline 08/04/2016  P:   Avoid sedating meds. Hold preadmission duloxetine, gabapentin, lorazepam, memantine, risperidone.   Can consider re-starting in 08/05/16  if more stable.  Family updated: le.  Daughter Dillon Bjork) called by EDP and updated.  Risks / Benefits of alteplase reportedly explained including possible fatal bleed and daughter willing to proceed (apparently there is a granddaughter who is a Nature conservation officer MD).  Daughter Lorna Few is currently flying to [place] from somewhere in [place]. Subsequtnntly family updated at admit  08/02/2016 by Dr Fayne Mediate  Interdisciplinary Family Meeting v Palliative Care Meeting:  DNR full medical with concurrent palliation   GLOBAL Iv heparin for 3-5 days before oral agents Wean o2 Move to tele 08/04/2016 and TRH primary with pccm off Monitor for bleed    Dr. Brand Males, M.D., [facility].C.P Pulmonary and Critical Care Medicine Staff Physician [facility] Pager: [phone], If no answer or between  15:00h - 7:00h: call [phone]  08/04/2016 11:23 AM

## 2016-08-04 NOTE — Progress Notes (Signed)
Speech Language Pathology Treatment: Dysphagia  Patient Details Name: [name] MRN: [id number] DOB: 1921-05-05 Today's Date: 08/04/2016 Time: [id number] SLP Time Calculation (min) (ACUTE ONLY): 10 min  Assessment / Plan / Recommendation Clinical Impression  Pt started waking up upon SLP arrival, but remained disoriented. Pt was repositioned to a more upright posture and provided minimal trials of thin liquids with resultant wet voice and cough. Pt was restless and then started laying back down, and would not take more PO trials despite encouragement from SLP and caregiver. Recommend to continue with current diet for now.   HPI HPI: 82 y.o.malewho was brought to [facility] ED with hypotension, hypoxia, syncopal episode, concern for PE. PMH: transient global amnesia, dementia, anxiety, skin cancer, PNA, HTN      SLP Plan  Continue with current plan of care     Recommendations  Diet recommendations: Thin liquid;Dysphagia 1 (puree) Liquids provided via: Cup;Straw Medication Administration: Crushed with puree Supervision: Patient able to self feed;Full supervision/cueing for compensatory strategies;Staff to assist with self feeding Compensations: Minimize environmental distractions;Slow rate;Small sips/bites Postural Changes and/or Swallow Maneuvers: Seated upright 90 degrees;Upright 30-60 min after meal                Oral Care Recommendations: Oral care BID Follow up Recommendations:  (tba) Plan: Continue with current plan of care       GO                [name] 08/04/2016, 4:04 PM  [name], M.A. CCC-SLP [phone]

## 2016-08-04 NOTE — Progress Notes (Addendum)
eLink Physician-Brief Progress Note Patient Name: [name] DOB: Aug 13, 1921 MRN: [id number]   Date of Service  08/04/2016  HPI/Events of Note  Family dose not want Xanax or Motrin. Will need to send the on call ground team to bedside to sort this all out. Unfortunately, no one is available until 8 PM. Will D/C the Xanax and Motrin.   eICU Interventions  Will ask on call ground team to see patient at bedside when they are available.      Intervention Category Minor Interventions: Communication with other healthcare providers and/or family  Lysle Dingwall 08/04/2016, 6:41 PM

## 2016-08-04 NOTE — Progress Notes (Signed)
[place] for Heparin Indication: pulmonary embolus  Allergies  Allergen Reactions  . Amoxicillin Diarrhea  . Codeine Other (See Comments)  . Feldene [Piroxicam] Other (See Comments)  . Indocin [Indomethacin] Other (See Comments)  . Morphine And Related Nausea And Vomiting  . Other     omnicor- unknown reaction  . Uroxatral [Alfuzosin Hcl Er] Other (See Comments)    Patient Measurements: Weight: 174 lb 2.6 oz (79 kg)  Height 183 cm Ideal Body weight: 77.7 kg Heparin Dosing Weight: actual  Vital Signs: Temp: 97.4 F (36.3 C) (10/20 0743) Temp Source: Oral (10/20 0743) BP: 123/76 (10/20 1000) Pulse Rate: 86 (10/20 1000)  Labs:  Recent Labs  08/02/16 1547 08/02/16 1551  08/02/16 2000 08/03/16 0045 08/03/16 0046  08/03/16 1614 08/03/16 2231 08/04/16 0307 08/04/16 0942  HGB 14.0 15.3  < >  --   --  13.0  < > 12.1* 11.9* 11.9* 11.1*  HCT 43.5 45.0  < >  --   --  40.8  < > 37.7* 37.1* 36.7* 34.6*  PLT 176  --   < >  --   --  163  < > 149* 151 151 158  APTT 31  --   --  50* 53*  --   --   --   --   --   --   LABPROT 14.6  --   --  16.6* 16.2*  --   --   --   --   --   --   INR 1.14  --   --  1.33 1.29  --   --   --   --   --   --   HEPARINUNFRC  --   --   --   --   --   --   < > 0.19*  --  0.48 0.35  CREATININE 1.56* 1.50*  --   --   --  1.70*  --   --   --  1.28*  --   TROPONINI  --   --   --  0.14*  --  0.44*  --   --   --  0.46*  --   < > = values in this interval not displayed.  CrCl cannot be calculated (Unknown ideal weight.).   Medications:  Infusions:  . sodium chloride 100 mL/hr at 08/03/16 2000  . heparin 1,300 Units/hr (08/03/16 2000)  . phenylephrine (NEO-SYNEPHRINE) Adult infusion Stopped (08/03/16 1330)    Assessment: 82 year old male s/p tPA in ED on 10/18 then started on IV heparin for PE.  Heparin level therapeutic (0.48) on gtt at 1300 units/hr. CBC stable. Confirmatory HL remains therapeutic at 0.35 on  heparin 1300 units/hr. No issues with infusion or bleeding. Will give slight boost in dose based on drop in HL to maintain therapeutic rate.  Goal of Therapy:  Heparin level 0.3-0.7 units/ml Monitor platelets by anticoagulation protocol: Yes   Plan:  Increase heparin 1330 units/hr Daily HL/CBC Monitor s/sx of bleeding  Andrey Cota. Diona Foley, PharmD, [place] Clinical Pharmacist Pager [phone] 08/04/2016 10:33 AM

## 2016-08-05 ENCOUNTER — Encounter (HOSPITAL_COMMUNITY): Payer: Self-pay | Admitting: *Deleted

## 2016-08-05 DIAGNOSIS — N400 Enlarged prostate without lower urinary tract symptoms: Secondary | ICD-10-CM

## 2016-08-05 DIAGNOSIS — I10 Essential (primary) hypertension: Secondary | ICD-10-CM

## 2016-08-05 DIAGNOSIS — R1319 Other dysphagia: Secondary | ICD-10-CM

## 2016-08-05 DIAGNOSIS — R570 Cardiogenic shock: Secondary | ICD-10-CM

## 2016-08-05 DIAGNOSIS — F0391 Unspecified dementia with behavioral disturbance: Secondary | ICD-10-CM

## 2016-08-05 DIAGNOSIS — N179 Acute kidney failure, unspecified: Secondary | ICD-10-CM

## 2016-08-05 LAB — PHOSPHORUS: PHOSPHORUS: 2.2 mg/dL — AB (ref 2.5–4.6)

## 2016-08-05 LAB — BASIC METABOLIC PANEL
Anion gap: 7 (ref 5–15)
BUN: 19 mg/dL (ref 6–20)
CO2: 21 mmol/L — ABNORMAL LOW (ref 22–32)
CREATININE: 1.16 mg/dL (ref 0.61–1.24)
Calcium: 8.5 mg/dL — ABNORMAL LOW (ref 8.9–10.3)
Chloride: 112 mmol/L — ABNORMAL HIGH (ref 101–111)
GFR calc Af Amer: >60 mL/min (ref >60)
GFR, EST NON AFRICAN AMERICAN: 52 mL/min — AB (ref >60)
GLUCOSE: 89 mg/dL (ref 65–99)
POTASSIUM: 3.9 mmol/L (ref 3.5–5.1)
SODIUM: 140 mmol/L (ref 135–145)

## 2016-08-05 LAB — MAGNESIUM: MAGNESIUM: 1.9 mg/dL (ref 1.7–2.4)

## 2016-08-05 LAB — HEPARIN LEVEL (UNFRACTIONATED): HEPARIN UNFRACTIONATED: 0.48 [IU]/mL (ref 0.30–0.70)

## 2016-08-05 NOTE — Progress Notes (Signed)
[facility] PROGRESS NOTE  [name] [id number] DOB: 1921/04/04 DOA: 08/02/2016 PCP: Melinda Crutch, MD  Interim summary and HPI 82 y.o. male who was brought to [facility] ED with hypotension, hypoxia, syncopal episode.  There was concern for PE; therefore, he was given empiric alteplase.  He was also started on neo-synephrine for persistent hypotension. Off pressors, improving oxygenation and doing better. Completing protocol of IV heparin after alteplase. Will get PT to see him  Assessment/Plan: Acute hypoxic respiratory failure - due to Massive PE  -s/p alteplase  -now on IV heparin; after completing 3-4 days on Iv anticoagulation will decide on oral agent for further anticoagulation therapy (coumadin vs novel agents) -will need treatment for at least 6-12 months -continue weaning oxygen as tolerated (goal O2 sat > 90%)   2-cardiogenic shock: due to PE -required pressors initially -now off pressors and with BP rising -will monitor VS -continue losartan and flomax  3-aki -due to hypotension and decrease perfusion -renal function is normal now -will monitor trend  4-BPH -will foley in place -continue flomax -outpatient follow up with urology service   5-dementia: -mentation back to baseline  -continue supportive care -will resume cymbalta, risperdal and neurontin   6-dysphagia -continue working with speech -tolerating dysphagia 1 diet  7-elevated troponin: -due to cardiogenic shock and PE -no CP -EF preserved on echo  8-physical deconditioning  -will ask Pt to evaluate and assess activity capacity  Code Status: DNR Family Communication: sitter at bedside  Disposition Plan: continue IV heparin, continue weaning O2 supplementation; assess ability and activity capacity; hopefully discharge in the next 48 hours.   Consultants:  PCCM  Cardiology   Procedures:  See below for x-ray reports   Echo: - Left ventricle: Recommend repeat limited study with  definity contrast to better assess for wall motion abnormalities of the LV. The cavity size was normal. There was moderate focal basal hypertrophy. Systolic function was normal. The estimated ejection fraction was in the range of 55% to 60%. Images were inadequate for LV wall motion assessment. - Ventricular septum: Septal motion showed moderate paradox. The contour showed severe diastolic flattening and severe systolic flattening. These changes are consistent with RV volume and pressure overload. - Aortic valve: Severely calcified annulus. Trileaflet; moderately thickened, moderately calcified leaflets. There was mild stenosis. Valve area (VTI): 1.04 cm^2. Valve area (Vmax): 1.14   cm^2. Valve area (Vmean): 1.07 cm^2. - Right ventricle: The cavity size was severely dilated. Wall   thickness was normal. Systolic function was severely reduced. - Right atrium: The atrium was mildly dilated. - Tricuspid valve: There was moderate regurgitation. - Pulmonary arteries: PA peak pressure: 67 mm Hg (S).  Impressions: - The right ventricular systolic pressure was increased consistent with moderate pulmonary hypertension.  Antibiotics:  None   HPI/Subjective: Feeling better, breathing easier and in no acute distress. Patient is now on 4 L [place]  Objective: Vitals:   08/05/16 1047 08/05/16 1848  BP: (!) 142/99 125/87  Pulse: 94 88  Resp: 18 18  Temp: 97.9 F (36.6 C) 98.7 F (37.1 C)    Intake/Output Summary (Last 24 hours) at 08/05/16 1931 Last data filed at 08/05/16 1849  Gross per 24 hour  Intake          1013.23 ml  Output             1100 ml  Net           -86.77 ml   Filed Weights   08/02/16 2249  08/04/16 0500 08/05/16 0504  Weight: 78.9 kg (173 lb 15.1 oz) 79 kg (174 lb 2.6 oz) 81.2 kg (179 lb 0.2 oz)    Exam:   General:  Afebrile, breathing better and in no distress. Patient is super hard of hearing and oriented X2 only. No CP reported.  Cardiovascular: S1 and S2, no rubs, no  gallops, no JVD appreciated  Respiratory: no wheezing, good air movement, no crackles  Abdomen:soft, NT, ND, positive BS  Musculoskeletal: no edema, no cyanosis   Data Reviewed: Basic Metabolic Panel:  Recent Labs Lab 08/02/16 1547 08/02/16 1551 08/03/16 0046 08/04/16 0307 08/05/16 0533  NA 140 141 137 140 140  K 4.2 4.1 4.4 4.2 3.9  CL 106 104 108 114* 112*  CO2 24  --  20* 19* 21*  GLUCOSE 150* 142* 246* 100* 89  BUN 21* 24* 23* 20 19  CREATININE 1.56* 1.50* 1.70* 1.28* 1.16  CALCIUM 8.9  --  8.0* 8.3* 8.5*  MG  --   --  1.8 2.2 1.9  PHOS  --   --  4.1 2.9 2.2*   Liver Function Tests:  Recent Labs Lab 08/02/16 1547 08/04/16 0307  AST 20 48*  ALT 13* 64*  ALKPHOS 56 73  BILITOT 0.7 0.4  PROT 6.1* 5.4*  ALBUMIN 3.2* 2.9*   CBC:  Recent Labs Lab 08/02/16 1547  08/03/16 1614 08/03/16 2231 08/04/16 0307 08/04/16 0942 08/04/16 1601  WBC 10.5  < > 11.7* 10.7* 11.1* 10.4 11.7*  NEUTROABS 8.2*  --   --   --   --   --   --   HGB 14.0  < > 12.1* 11.9* 11.9* 11.1* 12.0*  HCT 43.5  < > 37.7* 37.1* 36.7* 34.6* 37.0*  MCV 85.3  < > 85.1 85.1 84.4 84.6 84.3  PLT 176  < > 149* 151 151 158 199  < > = values in this interval not displayed.   Cardiac Enzymes:  Recent Labs Lab 08/02/16 2000 08/03/16 0046 08/04/16 0307  TROPONINI 0.14* 0.44* 0.46*   BNP (last 3 results)  Recent Labs  07/31/16 2107  BNP 199.1*   CBG:  Recent Labs Lab 08/03/16 2343 08/04/16 0342 08/04/16 0741 08/04/16 1117 08/04/16 2155  GLUCAP 88 101* 105* 93 120*    Recent Results (from the past 240 hour(s))  Urine culture     Status: Abnormal   Collection Time: 08/02/16  5:06 PM  Result Value Ref Range Status   Specimen Description URINE, CATHETERIZED  Final   Special Requests NONE  Final   Culture <10,000 COLONIES/mL INSIGNIFICANT GROWTH (A)  Final   Report Status 08/03/2016 FINAL  Final  Blood culture (routine x 2)     Status: None (Preliminary result)   Collection Time:  08/02/16  6:20 PM  Result Value Ref Range Status   Specimen Description BLOOD RIGHT HAND  Final   Special Requests BOTTLES DRAWN AEROBIC AND ANAEROBIC 5CC  Final   Culture NO GROWTH 3 DAYS  Final   Report Status PENDING  Incomplete  Blood culture (routine x 2)     Status: None (Preliminary result)   Collection Time: 08/02/16  6:30 PM  Result Value Ref Range Status   Specimen Description BLOOD LEFT HAND  Final   Special Requests BOTTLES DRAWN AEROBIC AND ANAEROBIC 5CC  Final   Culture NO GROWTH 3 DAYS  Final   Report Status PENDING  Incomplete  MRSA PCR Screening     Status: None   Collection  Time: 08/02/16 11:17 PM  Result Value Ref Range Status   MRSA by PCR NEGATIVE NEGATIVE Final    Comment:        The GeneXpert MRSA Assay (FDA approved for NASAL specimens only), is one component of a comprehensive MRSA colonization surveillance program. It is not intended to diagnose MRSA infection nor to guide or monitor treatment for MRSA infections.      Studies: No results found.  Scheduled Meds: . DULoxetine  60 mg Oral Daily  . gabapentin  100 mg Oral TID  . losartan  50 mg Oral Daily  . memantine  28 mg Oral Daily  . risperiDONE  0.5 mg Oral QHS  . tamsulosin  0.4 mg Oral Daily   Continuous Infusions: . heparin 1,350 Units/hr (08/05/16 0709)     Time spent: 30 minutes    Barton Dubois  [facility] Pager [phone]. If 7PM-7AM, please contact night-coverage at [email or web address], password [facility] 08/05/2016, 7:31 PM  LOS: 3 days

## 2016-08-05 NOTE — Progress Notes (Signed)
Received call from [name], patient's daughter. She states to call her with any concerns or for discharge planning.   Ms. Marijean Bravo expressed concerns about heparin d/t history of bleeding. Additional concerns for combative behavior earlier in admission stating the importance of his medication routine. Ms. Marijean Bravo stated that patient may need dose of ativan this evening to help keep his nerves calm.  Ms. Marijean Bravo also stated that patient is unlikely to eat puree foods and requested soft diet, and for ST to continue to follow for diet progression. Discussed patient swallowing pills whole in puree this morning without difficulty.   Patient is from home and has 24 hour care through [facility]. Caregiver support will continue while patient is in the hospital. Primary nurse at [facility] is [name]. Code word for patient information is baseball. Patient will need ambulance transport at discharge.   Will continue to monitor. Bartholomew Crews, RN

## 2016-08-05 NOTE — Progress Notes (Signed)
[place] for Heparin Indication: pulmonary embolus  Allergies  Allergen Reactions  . Amoxicillin Diarrhea  . Codeine Other (See Comments)  . Feldene [Piroxicam] Other (See Comments)  . Ibuprofen Other (See Comments)    H/o bleeding (per pt's daughter)  . Indocin [Indomethacin] Other (See Comments)  . Morphine And Related Nausea And Vomiting  . Other     omnicor- unknown reaction  . Uroxatral [Alfuzosin Hcl Er] Other (See Comments)    Patient Measurements: Weight: 179 lb 0.2 oz (81.2 kg)  Height 183 cm Ideal Body weight: 77.7 kg Heparin Dosing Weight: actual  Vital Signs: Temp: 97.9 F (36.6 C) (10/21 1047) Temp Source: Oral (10/21 1047) BP: 142/99 (10/21 1047) Pulse Rate: 94 (10/21 1047)  Labs:  Recent Labs  08/02/16 1547  08/02/16 2000 08/03/16 0045 08/03/16 0046  08/04/16 0307 08/04/16 0942 08/04/16 1601 08/05/16 0533  HGB 14.0  < >  --   --  13.0  < > 11.9* 11.1* 12.0*  --   HCT 43.5  < >  --   --  40.8  < > 36.7* 34.6* 37.0*  --   PLT 176  < >  --   --  163  < > 151 158 199  --   APTT 31  --  50* 53*  --   --   --   --   --   --   LABPROT 14.6  --  16.6* 16.2*  --   --   --   --   --   --   INR 1.14  --  1.33 1.29  --   --   --   --   --   --   HEPARINUNFRC  --   --   --   --   --   < > 0.48 0.35  --  0.48  CREATININE 1.56*  < >  --   --  1.70*  --  1.28*  --   --  1.16  TROPONINI  --   --  0.14*  --  0.44*  --  0.46*  --   --   --   < > = values in this interval not displayed.  CrCl cannot be calculated (Unknown ideal weight.).   Medications:  Infusions:  . heparin 1,350 Units/hr (08/05/16 [id number])    Assessment: 82 year old male with massive PE s/p systemic tPA given on 10/18. Currently on IV heparin drip per consult.  Heparin Level = 0.48 on 1350 units/hr. CBC stable -on 10/19 the  INR up 1.33>>1.29 post tPA given 10/18. No overt bleeding noted.  Patient transferred from [facility] to [place] on 08/04/16.     Goal of  Therapy:  Heparin level 0.3-0.7 units/ml Monitor platelets by anticoagulation protocol: Yes   Plan:  Continue IV heparin 1350 units/hr Daily HL/CBC Monitor s/sx of bleeding  Nicole Cella, RPh Clinical Pharmacist Pager: [phone] 08/05/2016 1:35 PM

## 2016-08-06 DIAGNOSIS — R748 Abnormal levels of other serum enzymes: Secondary | ICD-10-CM

## 2016-08-06 DIAGNOSIS — T8111XD Postprocedural  cardiogenic shock, subsequent encounter: Secondary | ICD-10-CM

## 2016-08-06 LAB — HEPARIN LEVEL (UNFRACTIONATED): HEPARIN UNFRACTIONATED: 0.35 [IU]/mL (ref 0.30–0.70)

## 2016-08-06 LAB — PHOSPHORUS: PHOSPHORUS: 2.4 mg/dL — AB (ref 2.5–4.6)

## 2016-08-06 LAB — MAGNESIUM: MAGNESIUM: 1.8 mg/dL (ref 1.7–2.4)

## 2016-08-06 MED ORDER — RIVAROXABAN 20 MG PO TABS: 20.0000 mg | ORAL_TABLET | Freq: Every day | ORAL | Status: DC

## 2016-08-06 MED ORDER — RIVAROXABAN 15 MG PO TABS
15.0000 mg | ORAL_TABLET | Freq: Two times a day (BID) | ORAL | Status: DC
  Administered 2016-08-06 – 2016-08-07 (×2): 15 mg via ORAL
  Filled 2016-08-06 (×2): qty 1

## 2016-08-06 NOTE — Discharge Instructions (Addendum)
Information on my medicine - ELIQUIS (apixaban)  This medication education was reviewed with me or my healthcare representative as part of my discharge preparation.   Why was Eliquis prescribed for you? Eliquis was prescribed to treat blood clots that may have been found in the veins of your legs (deep vein thrombosis) or in your lungs (pulmonary embolism) and to reduce the risk of them occurring again.  What do You need to know about Eliquis ? The starting dose is 10 mg (two 5 mg tablets) taken TWICE daily for the FIRST SEVEN (7) DAYS, then on 08/15/16  the dose is reduced to ONE 5 mg tablet taken TWICE daily.  Eliquis may be taken with or without food.   Try to take the dose about the same time in the morning and in the evening. If you have difficulty swallowing the tablet whole please discuss with your pharmacist how to take the medication safely.  Take Eliquis exactly as prescribed and DO NOT stop taking Eliquis without talking to the doctor who prescribed the medication.  Stopping may increase your risk of developing a new blood clot.  Refill your prescription before you run out.  After discharge, you should have regular check-up appointments with your healthcare provider that is prescribing your Eliquis.    What do you do if you miss a dose? If a dose of ELIQUIS is not taken at the scheduled time, take it as soon as possible on the same day and twice-daily administration should be resumed. The dose should not be doubled to make up for a missed dose.  Important Safety Information A possible side effect of Eliquis is bleeding. You should call your healthcare provider right away if you experience any of the following: ? Bleeding from an injury or your nose that does not stop. ? Unusual colored urine (red or dark brown) or unusual colored stools (red or black). ? Unusual bruising for unknown reasons. ? A serious fall or if you hit your head (even if there is no bleeding).  Some  medicines may interact with Eliquis and might increase your risk of bleeding or clotting while on Eliquis. To help avoid this, consult your healthcare provider or pharmacist prior to using any new prescription or non-prescription medications, including herbals, vitamins, non-steroidal anti-inflammatory drugs (NSAIDs) and supplements.  This website has more information on Eliquis (apixaban): http://www.eliquis.com/eliquis/home

## 2016-08-06 NOTE — Care Management Note (Signed)
Case Management Note  Patient Details  Name: [name] MRN: [id number] Date of Birth: 1920-12-23  Subjective/Objective:         Patient presented to [facility] ED with Syncope, AMS, Hypoxia, Hypotension r/o PE was on heparin has transitioned to Xarelto.             Action/Plan: CM contacted daughter Edward [phone]), POA who resides in [place]. To discuss care transitional recommendations and planning. Recommendations for [facility], PT,OT daughter was agreeable with plan  She states, that the patient had been active in the past with [facility], and would like to continue with their services. Patient has  24  Hour Aides provided by [facility] as per daughter.  Patient will be discharged home on Xarelto CM will provide a 30 day free trial card. [place] orders. CM will continue to follow up.  Expected Discharge Date:  08/05/16                Expected Discharge Plan:  [place]  In-House Referral:     Discharge planning Services  CM Consult  Post Acute Care Choice:    Choice offered to:   [name]  (daughter) [phone])  DME Arranged:    DME Agency:     HH Arranged:  (S) RN, PT, OT (Patient is active with [facility] has 24/7 Aides) [place]: [place]    Status of Service:  In process, will continue to follow  If discussed at Long Length of Stay Meetings, dates discussed:    Additional CommentsLaurena Slimmer, RN 08/06/2016, 2:11 PM

## 2016-08-06 NOTE — Progress Notes (Signed)
[facility] PROGRESS NOTE  [name] [id number] DOB: 1921/04/13 DOA: 08/02/2016 PCP: Melinda Crutch, MD  Interim summary and HPI 82 y.o. male who was brought to [facility] ED with hypotension, hypoxia, syncopal episode.  There was concern for PE; therefore, he was given empiric alteplase.  He was also started on neo-synephrine for persistent hypotension. Off pressors, improving oxygenation and doing better. Completing protocol of IV heparin after alteplase. Will get PT to see him  Assessment/Plan: Acute hypoxic respiratory failure - due to Massive PE  -s/p alteplase  -now on IV heparin; will transition to xarelto; dose per pharmacy. -will need treatment for at least 6-12 months -continue weaning oxygen as tolerated (goal O2 sat > 90%)   2-cardiogenic shock: due to PE -required pressors initially -now off pressors and with BP stable/rising -will monitor VS -continue losartan and flomax  3-aki -due to hypotension and decrease perfusion -renal function is normal now -will monitor trend  4-BPH -will foley in place -continue flomax -outpatient follow up with urology service   5-dementia: -mentation back to baseline  -continue supportive care -will continue cymbalta, risperdal and neurontin   6-dysphagia -continue working with speech -tolerating dysphagia 1 diet; speech to reevaluate before discharge for potential upgrades in his diet if safe.  7-elevated troponin: -due to cardiogenic shock and PE -no CP -EF preserved on echo  8-physical deconditioning  -will follow PT rec's -most likely return home with home services and private care.  Code Status: DNR Family Communication: sitter at bedside  Disposition Plan: will transition to [place], continue weaning O2 supplementation; assess ability and activity capacity; hopefully discharge in the next 24 hours.   Consultants:  PCCM  Cardiology   Procedures:  See below for x-ray reports   Echo: - Left ventricle:  Recommend repeat limited study with definity contrast to better assess for wall motion abnormalities of the LV. The cavity size was normal. There was moderate focal basal hypertrophy. Systolic function was normal. The estimated ejection fraction was in the range of 55% to 60%. Images were inadequate for LV wall motion assessment. - Ventricular septum: Septal motion showed moderate paradox. The contour showed severe diastolic flattening and severe systolic flattening. These changes are consistent with RV volume and pressure overload. - Aortic valve: Severely calcified annulus. Trileaflet; moderately thickened, moderately calcified leaflets. There was mild stenosis. Valve area (VTI): 1.04 cm^2. Valve area (Vmax): 1.14   cm^2. Valve area (Vmean): 1.07 cm^2. - Right ventricle: The cavity size was severely dilated. Wall   thickness was normal. Systolic function was severely reduced. - Right atrium: The atrium was mildly dilated. - Tricuspid valve: There was moderate regurgitation. - Pulmonary arteries: PA peak pressure: 67 mm Hg (S).  Impressions: - The right ventricular systolic pressure was increased consistent with moderate pulmonary hypertension.  Antibiotics:  None   HPI/Subjective: Feeling better, breathing easier and in no acute distress. Patient is now on 4 L [place] and with good O2 sat.  Objective: Vitals:   08/06/16 0505 08/06/16 1031  BP: (!) 149/83 (!) 148/90  Pulse: 83 84  Resp: 18 18  Temp: 98.4 F (36.9 C) 97.8 F (36.6 C)    Intake/Output Summary (Last 24 hours) at 08/06/16 1121 Last data filed at 08/06/16 0900  Gross per 24 hour  Intake           788.48 ml  Output              550 ml  Net  238.48 ml   Filed Weights   08/04/16 0500 08/05/16 0504 08/05/16 2218  Weight: 79 kg (174 lb 2.6 oz) 81.2 kg (179 lb 0.2 oz) 81.1 kg (178 lb 12.7 oz)    Exam:   General:  Afebrile, breathing much better and in no distress. Patient is super hard of hearing and oriented  X2 only (at baseline per sitter). No CP reported.  Cardiovascular: S1 and S2, no rubs, no gallops, no JVD appreciated  Respiratory: no wheezing, good air movement, no crackles  Abdomen:soft, NT, ND, positive BS  Musculoskeletal: no edema, no cyanosis   Data Reviewed: Basic Metabolic Panel:  Recent Labs Lab 08/02/16 1547 08/02/16 1551 08/03/16 0046 08/04/16 0307 08/05/16 0533 08/06/16 0500  NA 140 141 137 140 140  --   K 4.2 4.1 4.4 4.2 3.9  --   CL 106 104 108 114* 112*  --   CO2 24  --  20* 19* 21*  --   GLUCOSE 150* 142* 246* 100* 89  --   BUN 21* 24* 23* 20 19  --   CREATININE 1.56* 1.50* 1.70* 1.28* 1.16  --   CALCIUM 8.9  --  8.0* 8.3* 8.5*  --   MG  --   --  1.8 2.2 1.9 1.8  PHOS  --   --  4.1 2.9 2.2* 2.4*   Liver Function Tests:  Recent Labs Lab 08/02/16 1547 08/04/16 0307  AST 20 48*  ALT 13* 64*  ALKPHOS 56 73  BILITOT 0.7 0.4  PROT 6.1* 5.4*  ALBUMIN 3.2* 2.9*   CBC:  Recent Labs Lab 08/02/16 1547  08/03/16 1614 08/03/16 2231 08/04/16 0307 08/04/16 0942 08/04/16 1601  WBC 10.5  < > 11.7* 10.7* 11.1* 10.4 11.7*  NEUTROABS 8.2*  --   --   --   --   --   --   HGB 14.0  < > 12.1* 11.9* 11.9* 11.1* 12.0*  HCT 43.5  < > 37.7* 37.1* 36.7* 34.6* 37.0*  MCV 85.3  < > 85.1 85.1 84.4 84.6 84.3  PLT 176  < > 149* 151 151 158 199  < > = values in this interval not displayed.   Cardiac Enzymes:  Recent Labs Lab 08/02/16 2000 08/03/16 0046 08/04/16 0307  TROPONINI 0.14* 0.44* 0.46*   BNP (last 3 results)  Recent Labs  07/31/16 2107  BNP 199.1*   CBG:  Recent Labs Lab 08/03/16 2343 08/04/16 0342 08/04/16 0741 08/04/16 1117 08/04/16 2155  GLUCAP 88 101* 105* 93 120*    Recent Results (from the past 240 hour(s))  Urine culture     Status: Abnormal   Collection Time: 08/02/16  5:06 PM  Result Value Ref Range Status   Specimen Description URINE, CATHETERIZED  Final   Special Requests NONE  Final   Culture <10,000 COLONIES/mL  INSIGNIFICANT GROWTH (A)  Final   Report Status 08/03/2016 FINAL  Final  Blood culture (routine x 2)     Status: None (Preliminary result)   Collection Time: 08/02/16  6:20 PM  Result Value Ref Range Status   Specimen Description BLOOD RIGHT HAND  Final   Special Requests BOTTLES DRAWN AEROBIC AND ANAEROBIC 5CC  Final   Culture NO GROWTH 3 DAYS  Final   Report Status PENDING  Incomplete  Blood culture (routine x 2)     Status: None (Preliminary result)   Collection Time: 08/02/16  6:30 PM  Result Value Ref Range Status   Specimen Description BLOOD LEFT HAND  Final   Special Requests BOTTLES DRAWN AEROBIC AND ANAEROBIC 5CC  Final   Culture NO GROWTH 3 DAYS  Final   Report Status PENDING  Incomplete  MRSA PCR Screening     Status: None   Collection Time: 08/02/16 11:17 PM  Result Value Ref Range Status   MRSA by PCR NEGATIVE NEGATIVE Final    Comment:        The GeneXpert MRSA Assay (FDA approved for NASAL specimens only), is one component of a comprehensive MRSA colonization surveillance program. It is not intended to diagnose MRSA infection nor to guide or monitor treatment for MRSA infections.      Studies: No results found.  Scheduled Meds: . DULoxetine  60 mg Oral Daily  . gabapentin  100 mg Oral TID  . losartan  50 mg Oral Daily  . memantine  28 mg Oral Daily  . risperiDONE  0.5 mg Oral QHS  . tamsulosin  0.4 mg Oral Daily   Continuous Infusions: . heparin 1,350 Units/hr (08/06/16 0044)     Time spent: 30 minutes    Barton Dubois  [facility] Pager [phone]. If 7PM-7AM, please contact night-coverage at [email or web address], password [facility] 08/06/2016, 11:21 AM  LOS: 4 days

## 2016-08-06 NOTE — Evaluation (Signed)
Physical Therapy Evaluation Patient Details Name: [name] MRN: [id number] DOB: [birth year] Today's Date: 08/06/2016   History of Present Illness  82 y.o. 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  Clinical Impression   Pt admitted with above diagnosis. Pt currently with functional limitations due to the deficits listed below (see PT Problem List). For pt's with dementia, I tend to favor their own familiar environment, routine, and caregivers, provided it is a safe situation, and it is in this case; will ask for HHPT follow up;   Pt will benefit from skilled PT to increase their independence and safety with mobility to allow discharge to the venue listed below.       Follow Up Recommendations Home health PT;Supervision/Assistance - 24 hour  Non-emergent ambulance transport home    Equipment Recommendations  Other (comment) (Oxygen)    Recommendations for Other Services       Precautions / Restrictions Precautions Precautions: Fall Precaution Comments: Watch O2 sats      Mobility  Bed Mobility Overal bed mobility: Needs Assistance Bed Mobility: Rolling;Sidelying to Sit Rolling: Min assist Sidelying to sit: Mod assist       General bed mobility comments: Cues for technqiue; mod assist to elevate trunk to sit  Transfers Overall transfer level: Needs assistance Equipment used: Rolling walker (2 wheeled) Transfers: Sit to/from Stand Sit to Stand: Mod assist         General transfer comment: mod assist to power up and for balance due to posterior lean; did not fully extend LEs  Ambulation/Gait Ambulation/Gait assistance: Mod  assist Ambulation Distance (Feet):  (pivotal steps bed to chair) Assistive device: Rolling walker (2 wheeled) Gait Pattern/deviations: Shuffle     General Gait Details: mod assist for balance  Stairs            Wheelchair Mobility    Modified Rankin (Stroke Patients Only)       Balance Overall balance assessment: Needs assistance           Standing balance-Leahy Scale: Poor                               Pertinent Vitals/Pain Pain Assessment: Faces Faces Pain Scale: Hurts little more Pain Location: abdomen and stomach Pain Descriptors / Indicators: Grimacing (stating his stomach hurts) Pain Intervention(s): Monitored during session    Home Living Family/patient expects to be discharged to:: Private residence Living Arrangements: Non-relatives/Friends;Other (Comment) ([facility] caregivers) Available Help at Discharge: Personal care attendant;Available 24 hours/day Type of Home: House Home Access: Stairs to enter Entrance Stairs-Rails: None Entrance Stairs-Number of Steps: 3   Home Equipment: Walker - 2 wheels;Other (comment) (need more information re: any more home equip) Additional Comments: The above info is per caregiver, Inez Catalina    Prior Function Level of Independence: Needs assistance   Gait / Transfers Assistance Needed: with RW and caregivers           Hand Dominance        Extremity/Trunk Assessment   Upper Extremity Assessment: Generalized weakness           Lower Extremity Assessment: Generalized weakness         Communication  Communication: HOH (R  ear is better than L)  Cognition Arousal/Alertness:  (sleepy, but rouses well) Behavior During Therapy: WFL for tasks assessed/performed Overall Cognitive Status: History of cognitive impairments - at baseline                      General Comments General comments (skin integrity, edema, etc.): Session conducted on supplemental O2, 3-4 liters via [place]     Exercises     Assessment/Plan    PT Assessment Patient needs continued PT services  PT Problem List Decreased strength;Decreased activity tolerance;Decreased balance;Decreased mobility;Decreased coordination;Decreased cognition;Decreased knowledge of use of DME;Decreased safety awareness;Decreased knowledge of precautions;Cardiopulmonary status limiting activity;Pain          PT Treatment Interventions DME instruction;Gait training;Functional mobility training;Therapeutic activities;Therapeutic exercise;Balance training;Neuromuscular re-education;Patient/family education    PT Goals (Current goals can be found in the Care Plan section)  Acute Rehab PT Goals Patient Stated Goal: did not state PT Goal Formulation: With patient Time For Goal Achievement: 08/20/16 Potential to Achieve Goals: Good    Frequency Min 3X/week   Barriers to discharge        Co-evaluation               End of Session Equipment Utilized During Treatment: Gait belt;Oxygen Activity Tolerance: Patient limited by fatigue Patient left: in chair;with call bell/phone within reach;with family/visitor present Nurse Communication: Mobility status;Other (comment) (DC plan)         [id number] PT Time Calculation (min) (ACUTE ONLY): 41 min   Charges:   PT Evaluation $PT Eval Moderate Complexity: 1 Procedure PT Treatments $Therapeutic Activity: 23-37 mins   PT G Codes:        Roney Marion Hamff 08/06/2016, 1:15 PM  Roney Marion, [place]  Acute Rehabilitation Services Pager [phone] Office [phone]

## 2016-08-06 NOTE — Progress Notes (Signed)
[place] for Discontinue IV Heparin , switch to Xarelto  Indication: pulmonary embolus  Allergies  Allergen Reactions  . Amoxicillin Diarrhea  . Codeine Other (See Comments)  . Feldene [Piroxicam] Other (See Comments)  . Ibuprofen Other (See Comments)    H/o bleeding (per pt's daughter)  . Indocin [Indomethacin] Other (See Comments)  . Morphine And Related Nausea And Vomiting  . Other     omnicor- unknown reaction  . Uroxatral [Alfuzosin Hcl Er] Other (See Comments)    Patient Measurements: Height: 6' 0.05" (183 cm) (on 03/11/15) Weight: 178 lb 12.7 oz (81.1 kg) IBW/kg (Calculated) : 77.71  Height 183 cm Ideal Body weight: 77.7 kg Heparin Dosing Weight: actual  Vital Signs: Temp: 97.8 F (36.6 C) (10/22 1031) Temp Source: Oral (10/22 1031) BP: 148/90 (10/22 1031) Pulse Rate: 84 (10/22 1031)  Labs:  Recent Labs  08/04/16 0307 08/04/16 0942 08/04/16 1601 08/05/16 0533 08/06/16 0500  HGB 11.9* 11.1* 12.0*  --   --   HCT 36.7* 34.6* 37.0*  --   --   PLT 151 158 199  --   --   HEPARINUNFRC 0.48 0.35  --  0.48 0.35  CREATININE 1.28*  --   --  1.16  --   TROPONINI 0.46*  --   --   --   --     Estimated Creatinine Clearance: 43.7 mL/min (by C-G formula based on SCr of 1.16 mg/dL).   Medications:  Infusions:  . heparin 1,350 Units/hr (08/06/16 0044)    Assessment: 82 year old male with massive PE s/p systemic tPA given on 10/18.  Dr. Dyann Kief has ordered to switch from IV heparin drip to oral Xarelto for PE treatment.  His SCr has decreased to normal limits, SCr 1.16 Estimated CrCl is ~40 ml/min.  Heparin level currently therapeutic 0.35 on IV heparin drip 1350 units/hr No bleeding noted.       Plan:  Discontinue IV heparin drip 1350 units/hr at 17:00 supper time today and give 1st dose of Xarelto at time of heparin discontinuation.  Xarelto 15 mg po BID with food x 21 days the 20 mg daily with supper.  Monitor renal  function.  Monitor s/sx of bleeding Need to educate patient about Xarelto prior to discharge.   Nicole Cella, RPh Clinical Pharmacist Pager: [phone] 08/06/2016 12:24 PM

## 2016-08-07 ENCOUNTER — Encounter (HOSPITAL_COMMUNITY): Payer: Self-pay | Admitting: *Deleted

## 2016-08-07 ENCOUNTER — Inpatient Hospital Stay (HOSPITAL_COMMUNITY): Payer: Medicare Other

## 2016-08-07 DIAGNOSIS — R338 Other retention of urine: Secondary | ICD-10-CM

## 2016-08-07 DIAGNOSIS — N401 Enlarged prostate with lower urinary tract symptoms: Secondary | ICD-10-CM

## 2016-08-07 DIAGNOSIS — D649 Anemia, unspecified: Secondary | ICD-10-CM

## 2016-08-07 LAB — URINE MICROSCOPIC-ADD ON

## 2016-08-07 LAB — CBC
HCT: 26.3 % — ABNORMAL LOW (ref 39.0–52.0)
HEMOGLOBIN: 8.5 g/dL — AB (ref 13.0–17.0)
MCH: 27.2 pg (ref 26.0–34.0)
MCHC: 32.3 g/dL (ref 30.0–36.0)
MCV: 84.3 fL (ref 78.0–100.0)
PLATELETS: 187 10*3/uL (ref 150–400)
RBC: 3.12 MIL/uL — ABNORMAL LOW (ref 4.22–5.81)
RDW: 13.8 % (ref 11.5–15.5)
WBC: 9.6 10*3/uL (ref 4.0–10.5)

## 2016-08-07 LAB — BASIC METABOLIC PANEL
Anion gap: 6 (ref 5–15)
BUN: 19 mg/dL (ref 6–20)
CALCIUM: 8.2 mg/dL — AB (ref 8.9–10.3)
CHLORIDE: 112 mmol/L — AB (ref 101–111)
CO2: 23 mmol/L (ref 22–32)
CREATININE: 1.3 mg/dL — AB (ref 0.61–1.24)
GFR, EST AFRICAN AMERICAN: 53 mL/min — AB (ref >60)
GFR, EST NON AFRICAN AMERICAN: 46 mL/min — AB (ref >60)
Glucose, Bld: 102 mg/dL — ABNORMAL HIGH (ref 65–99)
Potassium: 4 mmol/L (ref 3.5–5.1)
SODIUM: 141 mmol/L (ref 135–145)

## 2016-08-07 LAB — CULTURE, BLOOD (ROUTINE X 2)
CULTURE: NO GROWTH
Culture: NO GROWTH

## 2016-08-07 LAB — URINALYSIS, ROUTINE W REFLEX MICROSCOPIC
Glucose, UA: NEGATIVE mg/dL
KETONES UR: NEGATIVE mg/dL
NITRITE: POSITIVE — AB
PROTEIN: 100 mg/dL — AB
Specific Gravity, Urine: <1.005 — ABNORMAL LOW (ref 1.005–1.030)
pH: 8.5 — ABNORMAL HIGH (ref 5.0–8.0)

## 2016-08-07 LAB — MAGNESIUM: MAGNESIUM: 1.7 mg/dL (ref 1.7–2.4)

## 2016-08-07 LAB — PHOSPHORUS: PHOSPHORUS: 2.8 mg/dL (ref 2.5–4.6)

## 2016-08-07 LAB — HEPARIN LEVEL (UNFRACTIONATED): HEPARIN UNFRACTIONATED: 1.1 [IU]/mL — AB (ref 0.30–0.70)

## 2016-08-07 MED ORDER — APIXABAN 5 MG PO TABS: 5.0000 mg | ORAL_TABLET | Freq: Two times a day (BID) | ORAL | Status: DC

## 2016-08-07 MED ORDER — APIXABAN 5 MG PO TABS
10.0000 mg | ORAL_TABLET | Freq: Two times a day (BID) | ORAL | Status: DC
  Administered 2016-08-07 – 2016-08-09 (×4): 10 mg via ORAL
  Filled 2016-08-07 (×4): qty 2

## 2016-08-07 MED ORDER — APIXABAN 5 MG PO TABS: 10.0000 mg | ORAL_TABLET | Freq: Two times a day (BID) | ORAL | Status: DC

## 2016-08-07 NOTE — Progress Notes (Signed)
ANTICOAGULATION CONSULT NOTE - Follow Up Consult  Pharmacy Consult for Apixaban Indication: pulmonary embolus  Allergies  Allergen Reactions  . Amoxicillin Diarrhea  . Codeine Other (See Comments)  . Feldene [Piroxicam] Other (See Comments)  . Ibuprofen Other (See Comments)    H/o bleeding (per pt's daughter)  . Indocin [Indomethacin] Other (See Comments)  . Morphine And Related Nausea And Vomiting  . Other     omnicor- unknown reaction  . Uroxatral [Alfuzosin Hcl Er] Other (See Comments)    Patient Measurements: Height: 6' 0.05" (183 cm) (on 03/11/15) Weight: 179 lb 0.2 oz (81.2 kg) IBW/kg (Calculated) : 77.71  Vital Signs: Temp: 98 F (36.7 C) (10/23 0950) Temp Source: Oral (10/23 0950) BP: 122/66 (10/23 0950) Pulse Rate: 93 (10/23 0950)  Labs:  Recent Labs  08/04/16 1601 08/05/16 0533 08/06/16 0500 08/07/16 0632  HGB 12.0*  --   --  8.5*  HCT 37.0*  --   --  26.3*  PLT 199  --   --  187  HEPARINUNFRC  --  0.48 0.35 1.10*  CREATININE  --  1.16  --  1.30*    Estimated Creatinine Clearance: 39 mL/min (by C-G formula based on SCr of 1.3 mg/dL (H)).   Assessment: 3 YOM with a large B/L submassive PE with R-heart strain s/p tPA in the ED on 10/18 and started on Heparin IV for anticoagulation. The patient was transitioned from Heparin to Xarelto on 10/22 and has received 2 doses.   Anticoagulation was discussed with the MD Dyann Kief) this morning. Given the patient's advanced age and renal function - it was discussed that apixaban would be a safer option as it has shown to have a slightly better safety profile (reduced bleeding, etc.) in this population. Will plan to transition the patient to apixaban today.  The patient's drop in Hgb was also discussed with the MD, Hgb 8.5 (10/23) << 12 (10/20) along with the patient's pain score of 10 with "back pain". The MD will evaluate - to continue anticoagulation for now.   Goal of Therapy:  Appropriate anticoagulation for  indication and renal/hepatic function   Plan:  1. D/c Xarelto 2. Start Apixaban 10 mg twice daily for 7 days then transition to Apixaban 5 mg twice daily 3. Will continue to monitor for any signs/symtpoms of bleeding  Thank you for allowing pharmacy to be a part of this patient's care.  Alycia Rossetti, PharmD, BCPS Clinical Pharmacist Pager: [phone] Clinical phone for 08/07/2016 from 7a-3:30p: [phone] If after 3:30p, please call main pharmacy at: [phone] 08/07/2016 12:00 PM

## 2016-08-07 NOTE — Discharge Planning (Addendum)
Pt daughter requesting hospital bed and wheelchair for home. Left voice mail with bedside CM.  Pt daughter states that [name] from [facility] will also be in contact with bedside CM regarding transition to home.  Camellia J. Clydene Laming, [place], [place], [place] [phone]  08/07/2016 Received information, await MD orders for Hospital bed and wheelchair.    Jasmine Pang RN BSN MPH [phone]

## 2016-08-07 NOTE — Progress Notes (Signed)
Speech Language Pathology Treatment: Dysphagia  Patient Details Name: [name] MRN: [id number] DOB: August 26, 1921 Today's Date: 08/07/2016 Time: 1443-1500 SLP Time Calculation (min) (ACUTE ONLY): 17 min  Assessment / Plan / Recommendation Clinical Impression  Pt is more alert and participatory today than during SLP's last visit, and he appears less short of breath. He consumed straw sips of thin liquids and regular solids with no overt s/s of aspiration, although with mildly prolonged mastication and oral residue noted. This may even be his baseline given his cognitive status and the condition of his dentition. Recommend advancement to Dys 3 diet and thin liquids.   HPI HPI: 82 y.o.malewho was brought to [facility] ED with hypotension, hypoxia, syncopal episode, concern for PE. PMH: transient global amnesia, dementia, anxiety, skin cancer, PNA, HTN      SLP Plan  Continue with current plan of care     Recommendations  Diet recommendations: Dysphagia 3 (mechanical soft);Thin liquid Liquids provided via: Cup;Straw Medication Administration: Whole meds with puree Supervision: Patient able to self feed;Full supervision/cueing for compensatory strategies;Staff to assist with self feeding Compensations: Minimize environmental distractions;Slow rate;Small sips/bites Postural Changes and/or Swallow Maneuvers: Seated upright 90 degrees;Upright 30-60 min after meal                Oral Care Recommendations: Oral care BID Follow up Recommendations: 24 hour supervision/assistance Plan: Continue with current plan of care       GO                [name] 08/07/2016, 3:30 PM  [name], M.A. CCC-SLP [phone]

## 2016-08-07 NOTE — Care Management (Signed)
08/07/2016 Noted CM consult, CM recommending [place] services and spoke with family who agree. Await [place] orders and face to face.  Jasmine Pang RN MPH, case manager [phone]

## 2016-08-07 NOTE — Progress Notes (Signed)
Physical Therapy Treatment Patient Details Name: [name] MRN: [id number] DOB: Dec 20, 1920 Today's Date: 08/07/2016    History of Present Illness 82 y.o. male who was brought to [facility] ED with hypotension, hypoxia, syncopal episode; found large PE;  has a past medical history of Anxiety; BPH (benign prostatic hypertrophy); Constipation, chronic; Dementia; Diverticular hemorrhage (2014); Frequency of urination; Hemorrhoids; Hypertension; Kidney stone on left side; Pneumonia; Skin cancer; and Transient global amnesia (15-20 years ago).    PT Comments    Pt. progressed to gait training.  Pt. fatigued easily.  O2 sat at 91% with 4 liters of O2.  Continue with gait and strength next visit.  Follow Up Recommendations  Home health PT;Supervision/Assistance - 24 hour     Equipment Recommendations       Recommendations for Other Services       Precautions / Restrictions Precautions Precautions: Fall Precaution Comments: Watch O2 sats Restrictions Weight Bearing Restrictions: No    Mobility  Bed Mobility Overal bed mobility: Needs Assistance Bed Mobility: Supine to Sit     Supine to sit: Mod assist     General bed mobility comments: Cues for technqiue; mod assist to elevate trunk to sit  Transfers Overall transfer level: Needs assistance Equipment used: Rolling walker (2 wheeled) Transfers: Sit to/from Stand Sit to Stand: Mod assist;+2 physical assistance         General transfer comment: mod assist to power up and for balance due to posterior lean; did not fully extend LEs  Ambulation/Gait Ambulation/Gait assistance: Mod assist;+2 safety/equipment Ambulation Distance (Feet): 5 Feet Assistive device: Rolling walker (2 wheeled) Gait Pattern/deviations: Decreased stride length;Shuffle;Trunk flexed;Narrow base of support   Gait velocity interpretation: Below normal speed for age/gender General Gait Details: Pt. required cuing for step length and to decrease forward  flexion and look at caregiver.   Stairs            Wheelchair Mobility    Modified Rankin (Stroke Patients Only)       Balance Overall balance assessment: Needs assistance   Sitting balance-Leahy Scale: Fair       Standing balance-Leahy Scale: Poor                      Cognition Arousal/Alertness: Suspect due to medications Behavior During Therapy: WFL for tasks assessed/performed Overall Cognitive Status: Within Functional Limits for tasks assessed                      Exercises Total Joint Exercises Towel Squeeze: AAROM;Both;10 reps General Exercises - Lower Extremity Ankle Circles/Pumps: AROM;Both;10 reps Short Arc QuadSinclair Ship;Both;10 reps Hip ABduction/ADduction: AAROM;Both;10 reps Straight Leg Raises: AAROM;Both;10 reps    General Comments        Pertinent Vitals/Pain Pain Assessment: Faces Faces Pain Scale: Hurts a little bit Pain Location: Pt. stated pain on his bottom. Pain Descriptors / Indicators: Grimacing Pain Intervention(s): Repositioned;Monitored during session    Home Living                      Prior Function            PT Goals (current goals can now be found in the care plan section) Acute Rehab PT Goals Patient Stated Goal: did not state Potential to Achieve Goals: Good Progress towards PT goals: Progressing toward goals    Frequency    Min 3X/week      PT Plan Current plan remains appropriate  Co-evaluation             End of Session Equipment Utilized During Treatment: Gait belt;Oxygen Activity Tolerance: Patient limited by fatigue (Pt. fell asleep during exercises.) Patient left: in chair;with call bell/phone within reach;with family/visitor present     Time: [id number] PT Time Calculation (min) (ACUTE ONLY): 28 min  Charges:  $Therapeutic Exercise: 8-22 mins $Therapeutic Activity: 8-22 mins                    G Codes:      Bary Castilla 2016/08/12, 5:27 PM Rito Ehrlich. Priseis Cratty,  SPTA

## 2016-08-07 NOTE — Progress Notes (Signed)
[facility] PROGRESS NOTE  [name] [id number] DOB: 11/25/1920 DOA: 08/02/2016 PCP: Melinda Crutch, MD  Interim summary and HPI 82 y.o. male who was brought to [facility] ED with hypotension, hypoxia, syncopal episode.  There was concern for PE; therefore, he was given empiric alteplase.  He was also started on neo-synephrine for persistent hypotension. Off pressors, improving oxygenation and doing better. Completing protocol of IV heparin after alteplase. Will get PT to see him  Assessment/Plan: Acute hypoxic respiratory failure - due to Massive PE  -s/p alteplase  -now on IV heparin; will transition to eliquis as per pharmacy rec's (given renal function and age); dose per pharmacy. -will need treatment for at least 6-12 months -continue weaning oxygen as tolerated (goal O2 sat > 90%)   2-cardiogenic shock: due to PE -required pressors initially -now off pressors and with BP stable/rising -will monitor VS -continue losartan and flomax  3-aki -due to hypotension and decrease perfusion -renal function is normal now -will monitor trend  4-BPH -will foley in place -continue flomax -outpatient follow up with urology service   5-dementia: -mentation back to baseline  -continue supportive care -will continue cymbalta, risperdal and neurontin   6-dysphagia -continue working with speech -upgraded today to dys3 with thin liquids.  7-elevated troponin: -due to cardiogenic shock and PE -No CP -EF preserved on echo  8-physical deconditioning  -will follow PT rec's -most likely return home with home services and private care.  9-anemia: -no signs of acute bleeding -Hgb drop 4 gram -will check CT w/o contrast to r/o retroperitoneal bleed -patient complaining of back pain. -no blood in urine appreciated.  Code Status: DNR Family Communication: sitter at bedside  Disposition Plan: will transition to eliquis as per pharmacy request, continue weaning O2 supplementation;  assess ability and activity capacity; hopefully discharge in the next 24-48 hours hours. Patient with drop in Hgb and complaining of back pain. Will r/o retroperitoneal bleed.   Consultants:  PCCM  Cardiology   Procedures:  See below for x-ray reports   Echo: - Left ventricle: Recommend repeat limited study with definity contrast to better assess for wall motion abnormalities of the LV. The cavity size was normal. There was moderate focal basal hypertrophy. Systolic function was normal. The estimated ejection fraction was in the range of 55% to 60%. Images were inadequate for LV wall motion assessment. - Ventricular septum: Septal motion showed moderate paradox. The contour showed severe diastolic flattening and severe systolic flattening. These changes are consistent with RV volume and pressure overload. - Aortic valve: Severely calcified annulus. Trileaflet; moderately thickened, moderately calcified leaflets. There was mild stenosis. Valve area (VTI): 1.04 cm^2. Valve area (Vmax): 1.14   cm^2. Valve area (Vmean): 1.07 cm^2. - Right ventricle: The cavity size was severely dilated. Wall   thickness was normal. Systolic function was severely reduced. - Right atrium: The atrium was mildly dilated. - Tricuspid valve: There was moderate regurgitation. - Pulmonary arteries: PA peak pressure: 67 mm Hg (S).  Impressions: - The right ventricular systolic pressure was increased consistent with moderate pulmonary hypertension.  Antibiotics:  None   HPI/Subjective: Feeling better, breathing ok and denying CP. Patient is now on 3-4 L  and with good O2 sat. Complaining of back pain.  Objective: Vitals:   08/07/16 0950 08/07/16 1548  BP: 122/66 132/80  Pulse: 93 90  Resp: (!) 28 (!) 22  Temp: 98 F (36.7 C) 99.2 F (37.3 C)    Intake/Output Summary (Last 24 hours) at 08/07/16 1757  Last data filed at 08/07/16 1551  Gross per 24 hour  Intake              450 ml  Output              1325 ml  Net             -875 ml   Filed Weights   08/05/16 0504 08/05/16 2218 08/06/16 2228  Weight: 81.2 kg (179 lb 0.2 oz) 81.1 kg (178 lb 12.7 oz) 81.2 kg (179 lb 0.2 oz)    Exam:   General:  Afebrile, breathing ok and in no distress. Patient is super hard of hearing and oriented X2 only (at baseline per sitter). No CP reported. Complaining of lower back pain.  Cardiovascular: S1 and S2, no rubs, no gallops, no JVD appreciated  Respiratory: no wheezing, good air movement, no crackles  Abdomen:soft, NT, ND, positive BS  Musculoskeletal: no edema, no cyanosis   Data Reviewed: Basic Metabolic Panel:  Recent Labs Lab 08/02/16 1547 08/02/16 1551 08/03/16 0046 08/04/16 0307 08/05/16 0533 08/06/16 0500 08/07/16 0632  NA 140 141 137 140 140  --  141  K 4.2 4.1 4.4 4.2 3.9  --  4.0  CL 106 104 108 114* 112*  --  112*  CO2 24  --  20* 19* 21*  --  23  GLUCOSE 150* 142* 246* 100* 89  --  102*  BUN 21* 24* 23* 20 19  --  19  CREATININE 1.56* 1.50* 1.70* 1.28* 1.16  --  1.30*  CALCIUM 8.9  --  8.0* 8.3* 8.5*  --  8.2*  MG  --   --  1.8 2.2 1.9 1.8 1.7  PHOS  --   --  4.1 2.9 2.2* 2.4* 2.8   Liver Function Tests:  Recent Labs Lab 08/02/16 1547 08/04/16 0307  AST 20 48*  ALT 13* 64*  ALKPHOS 56 73  BILITOT 0.7 0.4  PROT 6.1* 5.4*  ALBUMIN 3.2* 2.9*   CBC:  Recent Labs Lab 08/02/16 1547  08/03/16 2231 08/04/16 0307 08/04/16 0942 08/04/16 1601 08/07/16 0632  WBC 10.5  < > 10.7* 11.1* 10.4 11.7* 9.6  NEUTROABS 8.2*  --   --   --   --   --   --   HGB 14.0  < > 11.9* 11.9* 11.1* 12.0* 8.5*  HCT 43.5  < > 37.1* 36.7* 34.6* 37.0* 26.3*  MCV 85.3  < > 85.1 84.4 84.6 84.3 84.3  PLT 176  < > 151 151 158 199 187  < > = values in this interval not displayed.   Cardiac Enzymes:  Recent Labs Lab 08/02/16 2000 08/03/16 0046 08/04/16 0307  TROPONINI 0.14* 0.44* 0.46*   BNP (last 3 results)  Recent Labs  07/31/16 2107  BNP 199.1*   CBG:  Recent  Labs Lab 08/03/16 2343 08/04/16 0342 08/04/16 0741 08/04/16 1117 08/04/16 2155  GLUCAP 88 101* 105* 93 120*    Recent Results (from the past 240 hour(s))  Urine culture     Status: Abnormal   Collection Time: 08/02/16  5:06 PM  Result Value Ref Range Status   Specimen Description URINE, CATHETERIZED  Final   Special Requests NONE  Final   Culture <10,000 COLONIES/mL INSIGNIFICANT GROWTH (A)  Final   Report Status 08/03/2016 FINAL  Final  Blood culture (routine x 2)     Status: None   Collection Time: 08/02/16  6:20 PM  Result Value Ref Range Status  Specimen Description BLOOD RIGHT HAND  Final   Special Requests BOTTLES DRAWN AEROBIC AND ANAEROBIC 5CC  Final   Culture NO GROWTH 5 DAYS  Final   Report Status 08/07/2016 FINAL  Final  Blood culture (routine x 2)     Status: None   Collection Time: 08/02/16  6:30 PM  Result Value Ref Range Status   Specimen Description BLOOD LEFT HAND  Final   Special Requests BOTTLES DRAWN AEROBIC AND ANAEROBIC 5CC  Final   Culture NO GROWTH 5 DAYS  Final   Report Status 08/07/2016 FINAL  Final  MRSA PCR Screening     Status: None   Collection Time: 08/02/16 11:17 PM  Result Value Ref Range Status   MRSA by PCR NEGATIVE NEGATIVE Final    Comment:        The GeneXpert MRSA Assay (FDA approved for NASAL specimens only), is one component of a comprehensive MRSA colonization surveillance program. It is not intended to diagnose MRSA infection nor to guide or monitor treatment for MRSA infections.      Studies: No results found.  Scheduled Meds: . apixaban  10 mg Oral BID   Followed by  . [START ON 08/14/2016] apixaban  5 mg Oral BID  . DULoxetine  60 mg Oral Daily  . gabapentin  100 mg Oral TID  . losartan  50 mg Oral Daily  . memantine  28 mg Oral Daily  . risperiDONE  0.5 mg Oral QHS  . tamsulosin  0.4 mg Oral Daily   Continuous Infusions:     Time spent: 30 minutes    Barton Dubois  [facility] Pager  [phone]. If 7PM-7AM, please contact night-coverage at [email or web address], password [facility] 08/07/2016, 5:57 PM  LOS: 5 days

## 2016-08-08 DIAGNOSIS — S301XXA Contusion of abdominal wall, initial encounter: Secondary | ICD-10-CM

## 2016-08-08 DIAGNOSIS — D62 Acute posthemorrhagic anemia: Secondary | ICD-10-CM

## 2016-08-08 DIAGNOSIS — N182 Chronic kidney disease, stage 2 (mild): Secondary | ICD-10-CM

## 2016-08-08 LAB — CBC
HCT: 26.4 % — ABNORMAL LOW (ref 39.0–52.0)
HEMATOCRIT: 26.4 % — AB (ref 39.0–52.0)
Hemoglobin: 8.6 g/dL — ABNORMAL LOW (ref 13.0–17.0)
Hemoglobin: 8.5 g/dL — ABNORMAL LOW (ref 13.0–17.0)
MCH: 28 pg (ref 26.0–34.0)
MCH: 27.4 pg (ref 26.0–34.0)
MCHC: 32.6 g/dL (ref 30.0–36.0)
MCHC: 32.2 g/dL (ref 30.0–36.0)
MCV: 86 fL (ref 78.0–100.0)
MCV: 85.2 fL (ref 78.0–100.0)
PLATELETS: 201 10*3/uL (ref 150–400)
Platelets: 202 10*3/uL (ref 150–400)
RBC: 3.07 MIL/uL — ABNORMAL LOW (ref 4.22–5.81)
RBC: 3.1 MIL/uL — ABNORMAL LOW (ref 4.22–5.81)
RDW: 14.2 % (ref 11.5–15.5)
RDW: 14.1 % (ref 11.5–15.5)
WBC: 9.4 10*3/uL (ref 4.0–10.5)
WBC: 9 10*3/uL (ref 4.0–10.5)

## 2016-08-08 LAB — BASIC METABOLIC PANEL
Anion gap: 9 (ref 5–15)
BUN: 18 mg/dL (ref 6–20)
CALCIUM: 8.3 mg/dL — AB (ref 8.9–10.3)
CO2: 20 mmol/L — ABNORMAL LOW (ref 22–32)
Chloride: 111 mmol/L (ref 101–111)
Creatinine, Ser: 1.27 mg/dL — ABNORMAL HIGH (ref 0.61–1.24)
GFR calc Af Amer: 54 mL/min — ABNORMAL LOW (ref >60)
GFR, EST NON AFRICAN AMERICAN: 47 mL/min — AB (ref >60)
GLUCOSE: 97 mg/dL (ref 65–99)
Potassium: 4.1 mmol/L (ref 3.5–5.1)
Sodium: 140 mmol/L (ref 135–145)

## 2016-08-08 NOTE — Progress Notes (Signed)
Speech Language Pathology Treatment: Dysphagia  Patient Details Name: [name] MRN: [id number] DOB: 1921/08/04 Today's Date: 08/08/2016 Time: 4562-5638 SLP Time Calculation (min) (ACUTE ONLY): 8 min  Assessment / Plan / Recommendation Clinical Impression  Pt tolerated thin liquids, graham cracker and peanut butter without signs of aspiration or difficulty. Caregiver repots pt tolerated dinner well, but has refused breakfast and lunch. He typically eats soft solids such as spaghetti and cooked vegetables. Pt may continue current diet as he appears back to baseline. No SLP f/u needed, will sign off.    HPI HPI: 82 y.o.malewho was brought to [facility] ED with hypotension, hypoxia, syncopal episode, concern for PE. PMH: transient global amnesia, dementia, anxiety, skin cancer, PNA, HTN      SLP Plan  All goals met     Recommendations  Diet recommendations: Dysphagia 3 (mechanical soft);Thin liquid Liquids provided via: Cup;Straw Medication Administration: Whole meds with puree Supervision: Patient able to self feed;Full supervision/cueing for compensatory strategies;Staff to assist with self feeding Compensations: Minimize environmental distractions;Slow rate;Small sips/bites Postural Changes and/or Swallow Maneuvers: Seated upright 90 degrees;Upright 30-60 min after meal                Oral Care Recommendations: Oral care BID Follow up Recommendations: 24 hour supervision/assistance Plan: All goals met       GO               Herbie Baltimore, MA CCC-SLP [phone]  Lynann Beaver 08/08/2016, 3:52 PM

## 2016-08-08 NOTE — Care Management Important Message (Signed)
Important Message  Patient Details  Name: [name] MRN: [id number] Date of Birth: 05-Jul-1921   Medicare Important Message Given:  Yes    Orbie Pyo 08/08/2016, 2:45 PM

## 2016-08-08 NOTE — Progress Notes (Signed)
[facility] PROGRESS NOTE  [name] [id number] DOB: [birth year] DOA: 08/02/2016 PCP: Melinda Crutch, MD  Interim summary and HPI 82 y.o. male who was brought to [facility] ED with hypotension, hypoxia, syncopal episode.  There was concern for PE; therefore, he was given empiric alteplase.  He was also started on neo-synephrine for persistent hypotension. Off pressors, improving oxygenation and doing better. Completing protocol of IV heparin after alteplase. Will get PT to see him  Assessment/Plan: Acute hypoxic respiratory failure - due to Massive PE  -s/p alteplase  -now transitioned to eliquis as per pharmacy rec's (given renal function and age); dose per pharmacy. Received 3 days of IV heparin after thrombolysis. -will need treatment for at least 6-12 months -continue weaning oxygen as tolerated (goal O2 sat > 90%) -close follow up on his Hgb trend (due to rectus hematoma).   2-cardiogenic shock: due to PE -required pressors initially -now off pressors and with BP stable/rising -will monitor VS -continue losartan and flomax  3-acute on chronic renal disease (stage 2 at baseline) -due to hypotension and decrease perfusion -renal function is essentially back to baseline -will monitor trend  4-BPH -will foley in place -continue flomax -outpatient follow up with urology service   5-dementia: -mentation back to baseline (as per private sitters at bedside) -continue supportive care -will continue cymbalta, risperdal and Neurontin  -no behavioral issues appreciated today.   6-dysphagia -continue working with speech -upgraded to dysphagia 3 with thin liquids.  7-elevated troponin: -due to cardiogenic shock and PE -No CP -EF preserved on echo  8-physical deconditioning  -will follow PT rec's -recommended to return home with home services and private care. -light weight wheelchair and hospital bed ordered for discharge to facilitate care and mobility  9-anemia: acute  blood loss -no signs of acute bleeding on his urine and/or stools -Hgb drop 3.5 gram approx -CT w/o contrast demonstrated moderate right side rectus sheath hematoma extending into right lateral abdominal wall musculature and right pelvis. -patient back pain and abd pain essentially resolved now -Hgb trending up; will monitor closely to assess stability -will continue Eliquis for now  Code Status: DNR Family Communication: sitter at bedside and daughter by phone [phone]) Disposition Plan: will transition to eliquis as per pharmacy request, continue weaning O2 supplementation as tolerated. Patient with drop in Hgb and complaining of back.abd pain. CT abd positive for moderate hematoma on rectus sheath/lateral abdominal wall and pelvis. Hopefully discharge in the next 24-48 hours hours (once Hgb stability assured).   Consultants:  PCCM  Cardiology   Procedures:  Echo: - Left ventricle: Recommend repeat limited study with definity contrast to better assess for wall motion abnormalities of the LV. The cavity size was normal. There was moderate focal basal hypertrophy. Systolic function was normal. The estimated ejection fraction was in the range of 55% to 60%. Images were inadequate for LV wall motion assessment. - Ventricular septum: Septal motion showed moderate paradox. The contour showed severe diastolic flattening and severe systolic flattening. These changes are consistent with RV volume and pressure overload. - Aortic valve: Severely calcified annulus. Trileaflet; moderately thickened, moderately calcified leaflets. There was mild stenosis. Valve area (VTI): 1.04 cm^2. Valve area (Vmax): 1.14   cm^2. Valve area (Vmean): 1.07 cm^2. - Right ventricle: The cavity size was severely dilated. Wall   thickness was normal. Systolic function was severely reduced. - Right atrium: The atrium was mildly dilated. - Tricuspid valve: There was moderate regurgitation. - Pulmonary arteries: PA  peak pressure: 67 mm  Hg (S).  Impressions: - The right ventricular systolic pressure was increased consistent with moderate pulmonary hypertension.  CT abd and pelvis w/o contrast: see full report in x-ray area. Moderate size right-sided rectus sheath hematoma is noted which extends into the right lateral abdominal wall musculature, as well as slightly into the anterior portion of the right pelvis.  Antibiotics:  None   HPI/Subjective: Feeling better, breathing ok and denying CP. Patient is now on 2-3 L [place] and with good O2 sat. Patient reports improvement in his back pain and denies abd pain.  Objective: Vitals:   08/08/16 1126 08/08/16 1713  BP: (!) 169/86 (!) 144/86  Pulse: 91 84  Resp: (!) 30 20  Temp:  98.3 F (36.8 C)    Intake/Output Summary (Last 24 hours) at 08/08/16 1742 Last data filed at 08/08/16 1330  Gross per 24 hour  Intake              320 ml  Output              575 ml  Net             -255 ml   Filed Weights   08/05/16 2218 08/06/16 2228 08/07/16 2059  Weight: 81.1 kg (178 lb 12.7 oz) 81.2 kg (179 lb 0.2 oz) 82.5 kg (181 lb 14.4 oz)    Exam:   General:  Afebrile, breathing ok and in no distress. Patient super hard of hearing and oriented X2 only (at baseline per sitter). Able to follow commands, but with poor insight. No CP reported. Improvement in lower back reported. No abd pain.  Cardiovascular: S1 and S2, no rubs, no gallops, no JVD appreciated  Respiratory: no wheezing, good air movement, no crackles  Abdomen:soft, NT, ND, positive BS  Musculoskeletal: no edema, no cyanosis   Data Reviewed: Basic Metabolic Panel:  Recent Labs Lab 08/03/16 0046 08/04/16 0307 08/05/16 0533 08/06/16 0500 08/07/16 0632 08/08/16 0525  NA 137 140 140  --  141 140  K 4.4 4.2 3.9  --  4.0 4.1  CL 108 114* 112*  --  112* 111  CO2 20* 19* 21*  --  23 20*  GLUCOSE 246* 100* 89  --  102* 97  BUN 23* 20 19  --  19 18  CREATININE 1.70* 1.28* 1.16  --   1.30* 1.27*  CALCIUM 8.0* 8.3* 8.5*  --  8.2* 8.3*  MG 1.8 2.2 1.9 1.8 1.7  --   PHOS 4.1 2.9 2.2* 2.4* 2.8  --    Liver Function Tests:  Recent Labs Lab 08/02/16 1547 08/04/16 0307  AST 20 48*  ALT 13* 64*  ALKPHOS 56 73  BILITOT 0.7 0.4  PROT 6.1* 5.4*  ALBUMIN 3.2* 2.9*   CBC:  Recent Labs Lab 08/02/16 1547  08/04/16 0307 08/04/16 0942 08/04/16 1601 08/07/16 0632 08/08/16 0525  WBC 10.5  < > 11.1* 10.4 11.7* 9.6 9.4  NEUTROABS 8.2*  --   --   --   --   --   --   HGB 14.0  < > 11.9* 11.1* 12.0* 8.5* 8.6*  HCT 43.5  < > 36.7* 34.6* 37.0* 26.3* 26.4*  MCV 85.3  < > 84.4 84.6 84.3 84.3 86.0  PLT 176  < > 151 158 199 187 201  < > = values in this interval not displayed.   Cardiac Enzymes:  Recent Labs Lab 08/02/16 2000 08/03/16 0046 08/04/16 0307  TROPONINI 0.14* 0.44* 0.46*   BNP (  last 3 results)  Recent Labs  07/31/16 2107  BNP 199.1*   CBG:  Recent Labs Lab 08/03/16 2343 08/04/16 0342 08/04/16 0741 08/04/16 1117 08/04/16 2155  GLUCAP 88 101* 105* 93 120*    Recent Results (from the past 240 hour(s))  Urine culture     Status: Abnormal   Collection Time: 08/02/16  5:06 PM  Result Value Ref Range Status   Specimen Description URINE, CATHETERIZED  Final   Special Requests NONE  Final   Culture <10,000 COLONIES/mL INSIGNIFICANT GROWTH (A)  Final   Report Status 08/03/2016 FINAL  Final  Blood culture (routine x 2)     Status: None   Collection Time: 08/02/16  6:20 PM  Result Value Ref Range Status   Specimen Description BLOOD RIGHT HAND  Final   Special Requests BOTTLES DRAWN AEROBIC AND ANAEROBIC 5CC  Final   Culture NO GROWTH 5 DAYS  Final   Report Status 08/07/2016 FINAL  Final  Blood culture (routine x 2)     Status: None   Collection Time: 08/02/16  6:30 PM  Result Value Ref Range Status   Specimen Description BLOOD LEFT HAND  Final   Special Requests BOTTLES DRAWN AEROBIC AND ANAEROBIC 5CC  Final   Culture NO GROWTH 5 DAYS  Final    Report Status 08/07/2016 FINAL  Final  MRSA PCR Screening     Status: None   Collection Time: 08/02/16 11:17 PM  Result Value Ref Range Status   MRSA by PCR NEGATIVE NEGATIVE Final    Comment:        The GeneXpert MRSA Assay (FDA approved for NASAL specimens only), is one component of a comprehensive MRSA colonization surveillance program. It is not intended to diagnose MRSA infection nor to guide or monitor treatment for MRSA infections.      Studies: Ct Abdomen Pelvis Wo Contrast  Result Date: 08/08/2016 CLINICAL DATA:  Generalized abdominal pain. EXAM: CT ABDOMEN AND PELVIS WITHOUT CONTRAST TECHNIQUE: Multidetector CT imaging of the abdomen and pelvis was performed following the standard protocol without IV contrast. COMPARISON:  CT scan of August 13, 2014. FINDINGS: Lower chest: Mild bilateral pleural effusions are noted with adjacent subsegmental atelectasis. Hepatobiliary: Gallbladder is contracted. No gallstones are noted. No focal abnormality seen in the liver on these unenhanced images. Pancreas: Normal. Spleen: Splenic granulomata are noted.  Otherwise normal. Adrenals/Urinary Tract: Adrenal glands appear normal. Stable 3.2 cm exophytic cyst arising from upper pole left kidney. No hydronephrosis or renal obstruction is noted. Small nonobstructive right renal calculus is noted. Foley catheter is noted in urinary bladder. Urinary bladder is decompressed. However, it is thick walled suggesting inflammation or neoplasm. Possible hemorrhage within the urinary bladder cannot be excluded. Stomach/Bowel: There is no evidence of bowel obstruction. Sigmoid diverticulosis is noted without inflammation. Vascular/Lymphatic: Atherosclerosis of abdominal aorta is noted without aneurysm formation. No significant adenopathy is noted. Reproductive: Mild prostatic enlargement is noted Other: There is noted moderate size right-sided rectal sheath hematoma which extends into the right lateral  abdominal wall musculature. Small amount of hemorrhage is seen in the anterior pelvis which most likely extends from this rectal sheath hematoma. Musculoskeletal: Severe degenerative disc disease is noted at L5-S1. IMPRESSION: Mild bilateral pleural effusions are noted with adjacent subsegmental atelectasis. Small nonobstructive right renal calculus. No hydronephrosis or renal obstruction is noted. Aortic atherosclerosis. Sigmoid diverticulosis without inflammation. Moderate size right-sided rectus sheath hematoma is noted which extends into the right lateral abdominal wall musculature, as well as slightly into  the anterior portion of the right pelvis. Urinary bladder is decompressed secondary to Foley catheter. However it is significantly thick walled suggesting inflammation or possibly neoplasm. The possibility of hemorrhage within the urinary bladder lumen cannot be excluded. These results will be called to the ordering clinician or representative by the Radiologist Assistant, and communication documented in the PACS or zVision Dashboard. Electronically Signed   By: Marijo Conception, M.D.   On: 08/08/2016 07:29    Scheduled Meds: . apixaban  10 mg Oral BID   Followed by  . [START ON 08/14/2016] apixaban  5 mg Oral BID  . DULoxetine  60 mg Oral Daily  . gabapentin  100 mg Oral TID  . losartan  50 mg Oral Daily  . memantine  28 mg Oral Daily  . risperiDONE  0.5 mg Oral QHS  . tamsulosin  0.4 mg Oral Daily   Continuous Infusions:     Time spent: 30 minutes    Barton Dubois  [facility] Pager [phone]. If 7PM-7AM, please contact night-coverage at [email or web address], password [facility] 08/08/2016, 5:42 PM  LOS: 6 days

## 2016-08-08 NOTE — Progress Notes (Signed)
Physical Therapy Treatment Patient Details Name: [name] MRN: [id number] DOB: [birth year] Today's Date: 08/08/2016    History of Present Illness 82 y.o. male who was brought to [facility] ED with hypotension, hypoxia, syncopal episode; found large PE;  has a past medical history of Anxiety; BPH (benign prostatic hypertrophy); Constipation, chronic; Dementia; Diverticular hemorrhage (2014); Frequency of urination; Hemorrhoids; Hypertension; Kidney stone on left side; Pneumonia; Skin cancer; and Transient global amnesia (15-20 years ago).    PT Comments    Noting very nice improvements in activity tolerance; Continuous monitoring of O2 sats with activity; While [name] did have a few instances of O2 sats decreasing to 87%, his sats improved with focused breathing -- he did not need supplemental O2 for sats to increase to acceptable levels; in chair post amb, on Room Air, O2 sats 94%   Follow Up Recommendations  Home health PT;Supervision/Assistance - 24 hour     Equipment Recommendations  Wheelchair (measurements PT);Wheelchair cushion (measurements PT);Hospital bed;Other (comment) (Consider ambulance transport)    Recommendations for Other Services       Precautions / Restrictions Precautions Precautions: Fall Precaution Comments: Watch O2 sats    Mobility  Bed Mobility Overal bed mobility: Needs Assistance Bed Mobility: Supine to Sit Rolling: Min assist Sidelying to sit: Mod assist       General bed mobility comments: Cues for technqiue; mod assist to elevate trunk to sit  Transfers Overall transfer level: Needs assistance Equipment used: Rolling walker (2 wheeled) Transfers: Sit to/from Stand Sit to Stand: Mod assist         General transfer comment: mod assist to power up and for balance due to posterior lean; did not fully extend LEs; cues for posture at initial stand, and for deep breathing  Ambulation/Gait Ambulation/Gait assistance: Mod assist;+2  safety/equipment Ambulation Distance (Feet): 30 Feet Assistive device: Rolling walker (2 wheeled) Gait Pattern/deviations: Decreased step length - right;Decreased step length - left;Trunk flexed     General Gait Details: Continued need for cues for posture; very short step lengths bilaterally, R steps shorter than L ones; step length and posture improved with cues to walk "like you're at home"; O2 sats low on room air, ranged 87% (observed lowest) to 95%; O2 sats improved with cues for deep breathing -- did not need supplemental O2 to incr sats to acceptable levels   Stairs            Wheelchair Mobility    Modified Rankin (Stroke Patients Only)       Balance     Sitting balance-Leahy Scale: Fair       Standing balance-Leahy Scale: Poor                      Cognition Arousal/Alertness: Awake/alert Behavior During Therapy: WFL for tasks assessed/performed Overall Cognitive Status: History of cognitive impairments - at baseline                      Exercises      General Comments General comments (skin integrity, edema, etc.): Session conducted on Room Air; O2 sats ranged 87-95%      Pertinent Vitals/Pain Pain Assessment: Faces Faces Pain Scale: Hurts a little bit Pain Location: discomfort getting up at bottom and testicles Pain Descriptors / Indicators: Aching Pain Intervention(s): Limited activity within patient's tolerance;Monitored during session;Other (comment) (stood as soon as safely possible)    Home Living  Prior Function            PT Goals (current goals can now be found in the care plan section) Acute Rehab PT Goals Patient Stated Goal: did not state PT Goal Formulation: With patient Time For Goal Achievement: 08/20/16 Potential to Achieve Goals: Good Progress towards PT goals: Progressing toward goals    Frequency    Min 3X/week      PT Plan Current plan remains appropriate     Co-evaluation             End of Session Equipment Utilized During Treatment: Gait belt;Other (comment) (O2 sat monitor) Activity Tolerance: Patient tolerated treatment well Patient left: in chair;with call bell/phone within reach;with family/visitor present     Time: 0921-0940 PT Time Calculation (min) (ACUTE ONLY): 19 min  Charges:  $Gait Training: 8-22 mins                    G Codes:      Roney Marion Hamff 08/08/2016, 10:29 AM  Roney Marion, Ruth Pager [phone] Office [phone]

## 2016-08-08 NOTE — Care Management (Signed)
For delivery of hospital bed and wheelchair, please contact Margaretha Sheffield with [facility] : Cell [phone] Office [phone] Or [phone] She will assist in arranging delivery.   Thanks Jasmine Pang RN MPH, case manager, [phone]

## 2016-08-08 NOTE — Care Management Note (Signed)
Case Management Note  Patient Details  Name: [name] MRN: [id number] Date of Birth: 09-12-1921  Subjective/Objective:        CM following for progression and d/c planning.             Action/Plan: 08/08/2016 Noted orders for hospital bed and wheelchair, phone numbers to call for delivery of this equipment added to order and [facility] notified of order and contacts.   Expected Discharge Date:  08/05/16               Expected Discharge Plan:  [place]  In-House Referral:     Discharge planning Services  CM Consult  Post Acute Care Choice:  Durable Medical Equipment, Home Health Choice offered to:  Patient [name]  (daughter) [phone])  DME Arranged:  Hospital bed, Wheelchair manual DME Agency:  [place]:  (S) RN, PT, OT (Patient is active with [facility] has 24/7 Aides) [place]:  [place]  Status of Service:  In process, will continue to follow  If discussed at Long Length of Stay Meetings, dates discussed:    Additional Comments:  Adron Bene, RN 08/08/2016, 8:32 AM

## 2016-08-08 NOTE — Consult Note (Signed)
           [facility] CM Primary Care Navigator  08/08/2016  [facility] [birth year] [id number]   Patient (very hard of hearing) seen at the bedside to identify discharge needs. Caregiver from [facility] Elmyra Ricks) shared that patient had jerking movements and loss of consciousness in the car when going to doctor's appointment that led to this admission. Discharge plan is for possibly home with home health services (RN, PT, OT), with hospital bed and wheelchair. Patient is active with [facility] and has 24/7 caregivers.  Patient's caregiver confirmed that primary care provider is Dr. Lona Kettle with [facility]. Transportation to doctors' appointments is provided by Elmyra Ricks (caregiver).  Caregiver states using [facility] to obtain medications with no problem and nurse  from [facility] prepares and manages his medications using "pill box" system. [facility] caregivers are the primary providers of care at home. Patient has a daughter Suszanne Conners) who sees over patient's needs.  Patient's caregiver had expressed understanding to call primary care provider's office once patient is discharged, for a post discharge follow-up appointment within a week or sooner if needed.  Patient letter provided as a reminder.  For questions, please contact:  Dannielle Huh, BSN, RN- [facility] Primary Care Navigator  Telephone: [phone] [place]

## 2016-08-09 LAB — CBC
HEMATOCRIT: 26.7 % — AB (ref 39.0–52.0)
Hemoglobin: 8.5 g/dL — ABNORMAL LOW (ref 13.0–17.0)
MCH: 27.1 pg (ref 26.0–34.0)
MCHC: 31.8 g/dL (ref 30.0–36.0)
MCV: 85 fL (ref 78.0–100.0)
PLATELETS: 235 10*3/uL (ref 150–400)
RBC: 3.14 MIL/uL — ABNORMAL LOW (ref 4.22–5.81)
RDW: 14.3 % (ref 11.5–15.5)
WBC: 9.4 10*3/uL (ref 4.0–10.5)

## 2016-08-09 MED ORDER — APIXABAN 5 MG PO TABS: 10.0000 mg | ORAL_TABLET | Freq: Two times a day (BID) | ORAL | 0 refills | Status: DC

## 2016-08-09 MED ORDER — APIXABAN 5 MG PO TABS: 5.0000 mg | ORAL_TABLET | Freq: Two times a day (BID) | ORAL | 3 refills | Status: DC

## 2016-08-09 MED ORDER — RESOURCE THICKENUP CLEAR PO POWD: 1.0000 | ORAL | 0 refills | Status: AC | PRN

## 2016-08-09 NOTE — Progress Notes (Signed)
[name] to be D/C'd Home per MD order.  Discussed prescriptions and follow up appointments with the patient. Prescriptions given to patient, medication list explained in detail. Pt verbalized understanding.    Medication List    TAKE these medications   apixaban 5 MG Tabs tablet Commonly known as:  ELIQUIS Take 2 tablets (10 mg total) by mouth 2 (two) times daily.   apixaban 5 MG Tabs tablet Commonly known as:  ELIQUIS Take 1 tablet (5 mg total) by mouth 2 (two) times daily. Start taking on:  08/14/2016   DULoxetine 60 MG capsule Commonly known as:  CYMBALTA Take 60 mg by mouth daily.   gabapentin 100 MG capsule Commonly known as:  NEURONTIN Take 100 mg by mouth 3 (three) times daily. What changed:  Another medication with the same name was removed. Continue taking this medication, and follow the directions you see here.   LORazepam 0.5 MG tablet Commonly known as:  ATIVAN Take 0.25-0.5 mg by mouth 3 (three) times daily. For agitation or anxiety   losartan 50 MG tablet Commonly known as:  COZAAR Take 50 mg by mouth daily.   memantine 28 MG Cp24 24 hr capsule Commonly known as:  NAMENDA XR Take 28 mg by mouth daily.   [place] Take 1 Container by mouth as needed.   risperiDONE 0.5 MG tablet Commonly known as:  RISPERDAL Take 0.5 mg by mouth every morning.   tamsulosin 0.4 MG Caps capsule Commonly known as:  FLOMAX Take 0.4 mg by mouth daily.       Vitals:   08/09/16 0510 08/09/16 0928  BP: (!) 156/70 (!) 156/78  Pulse: 73 83  Resp: 18 18  Temp: 97.8 F (36.6 C) 98.5 F (36.9 C)    Skin clean, dry and intact without evidence of skin break down, no evidence of skin tears noted. IV catheter discontinued intact. Site without signs and symptoms of complications. Dressing and pressure applied. Pt denies pain at this time. No complaints noted.  An After Visit Summary was printed and given to the patient. Patient escorted via stretcher,  and D/C home via ambulance.  Retta Mac BSN, RN

## 2016-08-09 NOTE — Discharge Summary (Addendum)
Physician Discharge Summary  [name] [id number] DOB: 05/21/1921 DOA: 08/02/2016  PCP: Melinda Crutch, MD  Admit date: 08/02/2016 Discharge date: 08/09/2016  Time spent: 35 minutes  Recommendations for Outpatient Follow-up:  1. Will need to transition to 5 mg dosing of eliquis in 12 days 2. Monitor CBC in about one week's time 3. Monitor including and rectus sheath hematoma 4. Will need outpatient neurology follow-up 5. Will need continuous oxygen for acute/chronic respiratory failure  Discharge Diagnoses:  Active Problems:   Hypotension   Septic shock ([place])   Acute massive pulmonary embolism [facility])   Discharge Condition:Improved  recommendation: Should be on dysphagia 3 thickened liquids  Filed Weights   08/06/16 2228 08/07/16 2059 08/08/16 2055  Weight: 81.2 kg (179 lb 0.2 oz) 82.5 kg (181 lb 14.4 oz) 84.8 kg (186 lb 15.2 oz)    History of present illness:  82 y.o.malewho was brought to [facility] ED with hypotension, hypoxia, syncopal episode. There was concern for PE; therefore, he was given empiric alteplase. He was also started on neo-synephrine for persistent hypotension. Off pressors, improving oxygenation and doing better. Completing protocol of IV heparin after alteplase.  Hospital Course:   Acute hypoxic respiratory failure superimposed on chr respiratory failure - due to Massive PE  -s/p alteplase  -transitioned to eliquis as per pharmacy rec's (given renal function and age); dose per pharmacy. Received 3 days of IV heparin after thrombolysis. -will need treatment for at least 6-12 months -continue weaning oxygen as tolerated (goal O2 sat > 90%)-might require home oxygen if O2 sat is below 90 -close follow up on his Hgb trend (due to rectus hematoma).  -requires oxygen 2l for acute respiratory failure superimposed on anemia and probable diastolic HF with cardiogenic shock on admit  2-cardiogenic shock: due to PE -required pressors initially -now off pressors  and with BP stable/rising -will monitor VS -continue losartan 50 mg daily and flomax 0.4 daily  3-acute on chronic renal disease (stage 2 at baseline) -due to hypotension and decrease perfusion -renal function is essentially back to baseline -will monitor trend  4-BPH -will foley in place -continue flomax -outpatient follow up with urology service is needed as an outpatient  5-dementia: -mentation back to baseline (as per private sitters at bedside) -continue supportive care -will continue cymbalta, risperdal and Neurontin  -no behavioral issues appreciated today.   6-dysphagia -continue working with speech -upgraded to dysphagia 3 with thin liquids.  7-elevated troponin: -due to cardiogenic shock and PE -No CP -EF preserved on echo  8-physical deconditioning  -will follow PT rec's -recommended to return home with home services and private care. -light weight wheelchair and hospital bed ordered for discharge to facilitate care and mobility  9-anemia: acute blood loss -no signs of acute bleeding on his urine and/or stools -Hgb drop 3.5 gram approx -CT w/o contrast demonstrated moderate right side rectus sheath hematoma extending into right lateral abdominal wall musculature and right pelvis. -patient back pain and abd pain essentially resolved now -Hgb trending up; will monitor closely to assess stability -will continue Eliquis for now   Discharge Exam: Vitals:   08/09/16 0510 08/09/16 0928  BP: (!) 156/70 (!) 156/78  Pulse: 73 83  Resp: 18 18  Temp: 97.8 F (36.6 C) 98.5 F (36.9 C)    General: alert well oriented Cardiovascular: s1 s2 no m/r/g Respiratory:  Clear no added  Discharge Instructions   Discharge Instructions    Diet - low sodium heart healthy    Complete by:  As directed    Increase activity slowly    Complete by:  As directed      Current Discharge Medication List    START taking these medications   Details  !! apixaban  (ELIQUIS) 5 MG TABS tablet Take 2 tablets (10 mg total) by mouth 2 (two) times daily. Qty: 22 tablet, Refills: 0    !! apixaban (ELIQUIS) 5 MG TABS tablet Take 1 tablet (5 mg total) by mouth 2 (two) times daily. Qty: 60 tablet, Refills: 3    Maltodextrin-Xanthan Gum (RESOURCE THICKENUP CLEAR) POWD Take 1 Container by mouth as needed. Qty: 1 Can, Refills: 0     !! - Potential duplicate medications found. Please discuss with provider.    CONTINUE these medications which have NOT CHANGED   Details  DULoxetine (CYMBALTA) 60 MG capsule Take 60 mg by mouth daily.    gabapentin (NEURONTIN) 100 MG capsule Take 100 mg by mouth 3 (three) times daily.    LORazepam (ATIVAN) 0.5 MG tablet Take 0.25-0.5 mg by mouth 3 (three) times daily. For agitation or anxiety    losartan (COZAAR) 50 MG tablet Take 50 mg by mouth daily.    memantine (NAMENDA XR) 28 MG CP24 24 hr capsule Take 28 mg by mouth daily.    risperiDONE (RISPERDAL) 0.5 MG tablet Take 0.5 mg by mouth every morning.     Tamsulosin HCl (FLOMAX) 0.4 MG CAPS Take 0.4 mg by mouth daily.       Allergies  Allergen Reactions  . Amoxicillin Diarrhea  . Codeine Other (See Comments)  . Feldene [Piroxicam] Other (See Comments)  . Ibuprofen Other (See Comments)    H/o bleeding (per pt's daughter)  . Indocin [Indomethacin] Other (See Comments)  . Morphine And Related Nausea And Vomiting  . Other     omnicor- unknown reaction  . Uroxatral [Alfuzosin Hcl Er] Other (See Comments)      The results of significant diagnostics from this hospitalization (including imaging, microbiology, ancillary and laboratory) are listed below for reference.    Significant Diagnostic Studies: Ct Abdomen Pelvis Wo Contrast  Result Date: 08/08/2016 CLINICAL DATA:  Generalized abdominal pain. EXAM: CT ABDOMEN AND PELVIS WITHOUT CONTRAST TECHNIQUE: Multidetector CT imaging of the abdomen and pelvis was performed following the standard protocol without IV  contrast. COMPARISON:  CT scan of August 13, 2014. FINDINGS: Lower chest: Mild bilateral pleural effusions are noted with adjacent subsegmental atelectasis. Hepatobiliary: Gallbladder is contracted. No gallstones are noted. No focal abnormality seen in the liver on these unenhanced images. Pancreas: Normal. Spleen: Splenic granulomata are noted.  Otherwise normal. Adrenals/Urinary Tract: Adrenal glands appear normal. Stable 3.2 cm exophytic cyst arising from upper pole left kidney. No hydronephrosis or renal obstruction is noted. Small nonobstructive right renal calculus is noted. Foley catheter is noted in urinary bladder. Urinary bladder is decompressed. However, it is thick walled suggesting inflammation or neoplasm. Possible hemorrhage within the urinary bladder cannot be excluded. Stomach/Bowel: There is no evidence of bowel obstruction. Sigmoid diverticulosis is noted without inflammation. Vascular/Lymphatic: Atherosclerosis of abdominal aorta is noted without aneurysm formation. No significant adenopathy is noted. Reproductive: Mild prostatic enlargement is noted Other: There is noted moderate size right-sided rectal sheath hematoma which extends into the right lateral abdominal wall musculature. Small amount of hemorrhage is seen in the anterior pelvis which most likely extends from this rectal sheath hematoma. Musculoskeletal: Severe degenerative disc disease is noted at L5-S1. IMPRESSION: Mild bilateral pleural effusions are noted with adjacent subsegmental atelectasis. Small nonobstructive  right renal calculus. No hydronephrosis or renal obstruction is noted. Aortic atherosclerosis. Sigmoid diverticulosis without inflammation. Moderate size right-sided rectus sheath hematoma is noted which extends into the right lateral abdominal wall musculature, as well as slightly into the anterior portion of the right pelvis. Urinary bladder is decompressed secondary to Foley catheter. However it is significantly  thick walled suggesting inflammation or possibly neoplasm. The possibility of hemorrhage within the urinary bladder lumen cannot be excluded. These results will be called to the ordering clinician or representative by the Radiologist Assistant, and communication documented in the PACS or zVision Dashboard. Electronically Signed   By: Marijo Conception, M.D.   On: 08/08/2016 07:29   Dg Chest 2 View  Result Date: 07/31/2016 CLINICAL DATA:  Cough for several days and fever EXAM: CHEST  2 VIEW COMPARISON:  07/28/2012 FINDINGS: There is mild cardiomegaly. There is atherosclerosis of the thoracic aorta. Calcified granuloma in the left upper lobe is stable. No pneumonic consolidation, CHF, or pneumothorax. Minimal blunting of the right costophrenic angle. Glenohumeral and AC joint osteoarthritis bilaterally. IMPRESSION: No active cardiopulmonary disease. Electronically Signed   By: Ashley Royalty M.D.   On: 07/31/2016 22:08   Ct Head Wo Contrast  Result Date: 08/03/2016 CLINICAL DATA:  82 year old male with altered mental status EXAM: CT HEAD WITHOUT CONTRAST TECHNIQUE: Contiguous axial images were obtained from the base of the skull through the vertex without intravenous contrast. COMPARISON:  None. FINDINGS: Brain: There is mild to moderate age-related atrophy and chronic microvascular ischemic changes. There is no acute intracranial hemorrhage. No mass effect or midline shift noted. Vascular: No hyperdense vessel or unexpected calcification. Skull: Normal. Negative for fracture or focal lesion. Sinuses/Orbits: No acute finding. Other: None IMPRESSION: No acute intracranial hemorrhage. Age-related atrophy and chronic microvascular ischemic disease. If symptoms persist and there are no contraindications, MRI may provide better evaluation if clinically indicated. Electronically Signed   By: Anner Crete M.D.   On: 08/03/2016 06:52   Ct Angio Chest Pe W And/or Wo Contrast  Result Date: 08/03/2016 CLINICAL  DATA:  There is no hilar or mediastinal adenopathy. The esophagus is grossly unremarkable. EXAM: CT ANGIOGRAPHY CHEST WITH CONTRAST TECHNIQUE: Multidetector CT imaging of the chest was performed using the standard protocol during bolus administration of intravenous contrast. Multiplanar CT image reconstructions and MIPs were obtained to evaluate the vascular anatomy. CONTRAST:  80 cc Isovue 370 COMPARISON:  Chest radiograph dated 08/02/2016 FINDINGS: Evaluation is limited due to streak artifact caused by patient's arms. Cardiovascular: There is a moderate atherosclerotic calcification of the thoracic aorta. No aneurysmal dilatation or evidence of dissection. Large bilateral pulmonary artery emboli with involvement of the lobar branches of the right upper and right lower lobe as well as segmental branches of the left lower lobe. There is mild cardiomegaly. The right ventricle measures 61 mm in greatest diameter and the left ventricle measures 39 mm in greatest diameter compatible with a degree of right cardiac straining. Mediastinum/Nodes: There is no hilar or mediastinal adenopathy. The esophagus is grossly unremarkable. Lungs/Pleura: Small bilateral pleural effusions with partial compressive atelectasis of the lower lobes. Superimposed pneumonia is less likely but not excluded. There is no pneumothorax. Upper Abdomen: There is retrograde flow of contrast from the right atrium into the IVC compatible with a degree of right cardiac dysfunction. Correlation with echocardiogram recommended. A 3.0 x 3.5 cm hypoattenuating lesion arising from the superior pole of the left kidney is not characterized on this CT but appears similar to the cyst  seen on the CT dated 08/13/2014. Small amount of fluid is noted adjacent to the spleen. Evaluation of this fluid is limited due to streak artifact but this fluid appears hypo attenuating. Correlation with clinical exam recommended. Underlying splenic injury is not excluded. CT of the  abdomen pelvis with better positioning of the patient to reduce streak artifact may provide better evaluation of the spleen and perisplenic fluid. Musculoskeletal: Osteopenia with multilevel degenerative changes of the spine. No acute fracture. Review of the MIP images confirms the above findings. IMPRESSION: Large bilateral pulmonary artery emboli with CT evidence of right heart strain (RV/LV Ratio = 1.6) consistent with at least submassive (intermediate risk) PE. The presence of right heart strain has been associated with an increased risk of morbidity and mortality. Please activate Code PE by paging [phone]. Small bilateral pleural effusions. Small amount of fluid adjacent to the spleen, incompletely characterized. Correlation with clinical exam recommended. CT of the abdomen pelvis may provide better evaluation of the spleen and perisplenic fluid. These results were called by telephone at the time of interpretation on 08/03/2016 at 7:03 am to nurse Casper Harrison who verbally acknowledged these results. Electronically Signed   By: Anner Crete M.D.   On: 08/03/2016 07:05   Dg Chest Port 1 View  Result Date: 08/03/2016 CLINICAL DATA:  Respiratory failure and shortness of breath, known pulmonary emboli EXAM: PORTABLE CHEST 1 VIEW COMPARISON:  08/02/2016 FINDINGS: Cardiac shadow remains enlarged in size. Aortic calcifications are again noted. Mild interstitial changes are again seen. No focal confluent infiltrate is noted. No acute bony abnormality is noted. IMPRESSION: Stable appearance of the chest with mild interstitial changes. No focal infiltrate is seen. Electronically Signed   By: Inez Catalina M.D.   On: 08/03/2016 07:48   Dg Chest Portable 1 View  Result Date: 08/02/2016 CLINICAL DATA:  Altered mental status. EXAM: PORTABLE CHEST 1 VIEW COMPARISON:  07/31/2016 FINDINGS: 1549 hours. The cardio pericardial silhouette is enlarged. The lungs are clear wiithout focal pneumonia, edema, pneumothorax or  pleural effusion. Interstitial markings are diffusely coarsened with chronic features. Calcified granuloma left mid lung again noted. The visualized bony structures of the thorax are intact. Telemetry leads overlie the chest. IMPRESSION: Cardiomegaly with chronic interstitial changes. No acute cardiopulmonary findings. Electronically Signed   By: Misty Stanley M.D.   On: 08/02/2016 16:26    Microbiology: Recent Results (from the past 240 hour(s))  Urine culture     Status: Abnormal   Collection Time: 08/02/16  5:06 PM  Result Value Ref Range Status   Specimen Description URINE, CATHETERIZED  Final   Special Requests NONE  Final   Culture <10,000 COLONIES/mL INSIGNIFICANT GROWTH (A)  Final   Report Status 08/03/2016 FINAL  Final  Blood culture (routine x 2)     Status: None   Collection Time: 08/02/16  6:20 PM  Result Value Ref Range Status   Specimen Description BLOOD RIGHT HAND  Final   Special Requests BOTTLES DRAWN AEROBIC AND ANAEROBIC 5CC  Final   Culture NO GROWTH 5 DAYS  Final   Report Status 08/07/2016 FINAL  Final  Blood culture (routine x 2)     Status: None   Collection Time: 08/02/16  6:30 PM  Result Value Ref Range Status   Specimen Description BLOOD LEFT HAND  Final   Special Requests BOTTLES DRAWN AEROBIC AND ANAEROBIC 5CC  Final   Culture NO GROWTH 5 DAYS  Final   Report Status 08/07/2016 FINAL  Final  MRSA PCR Screening  Status: None   Collection Time: 08/02/16 11:17 PM  Result Value Ref Range Status   MRSA by PCR NEGATIVE NEGATIVE Final    Comment:        The GeneXpert MRSA Assay (FDA approved for NASAL specimens only), is one component of a comprehensive MRSA colonization surveillance program. It is not intended to diagnose MRSA infection nor to guide or monitor treatment for MRSA infections.      Labs: Basic Metabolic Panel:  Recent Labs Lab 08/03/16 0046 08/04/16 0307 08/05/16 0533 08/06/16 0500 08/07/16 0632 08/08/16 0525  NA 137 140 140   --  141 140  K 4.4 4.2 3.9  --  4.0 4.1  CL 108 114* 112*  --  112* 111  CO2 20* 19* 21*  --  23 20*  GLUCOSE 246* 100* 89  --  102* 97  BUN 23* 20 19  --  19 18  CREATININE 1.70* 1.28* 1.16  --  1.30* 1.27*  CALCIUM 8.0* 8.3* 8.5*  --  8.2* 8.3*  MG 1.8 2.2 1.9 1.8 1.7  --   PHOS 4.1 2.9 2.2* 2.4* 2.8  --    Liver Function Tests:  Recent Labs Lab 08/02/16 1547 08/04/16 0307  AST 20 48*  ALT 13* 64*  ALKPHOS 56 73  BILITOT 0.7 0.4  PROT 6.1* 5.4*  ALBUMIN 3.2* 2.9*   No results for input(s): LIPASE, AMYLASE in the last 168 hours. No results for input(s): AMMONIA in the last 168 hours. CBC:  Recent Labs Lab 08/02/16 1547  08/04/16 1601 08/07/16 [id number] 08/08/16 0525 08/08/16 1832 08/09/16 0611  WBC 10.5  < > 11.7* 9.6 9.4 9.0 9.4  NEUTROABS 8.2*  --   --   --   --   --   --   HGB 14.0  < > 12.0* 8.5* 8.6* 8.5* 8.5*  HCT 43.5  < > 37.0* 26.3* 26.4* 26.4* 26.7*  MCV 85.3  < > 84.3 84.3 86.0 85.2 85.0  PLT 176  < > 199 187 201 202 235  < > = values in this interval not displayed. Cardiac Enzymes:  Recent Labs Lab 08/02/16 2000 08/03/16 0046 08/04/16 0307  TROPONINI 0.14* 0.44* 0.46*   BNP: BNP (last 3 results)  Recent Labs  07/31/16 2107  BNP 199.1*    ProBNP (last 3 results) No results for input(s): PROBNP in the last 8760 hours.  CBG:  Recent Labs Lab 08/03/16 2343 08/04/16 0342 08/04/16 0741 08/04/16 1117 08/04/16 2155  GLUCAP 88 101* 105* 93 120*       Signed:  Nita Sells MD   [facility] 08/09/2016, 10:59 AM

## 2016-08-09 NOTE — Care Management Note (Signed)
Case Management Note  Patient Details  Name: [name] MRN: [id number] Date of Birth: 1921/06/27  Subjective/Objective:        CM following for progression and d/c planning.             Action/Plan: 08/09/2016 Pt for d/c to home today: Ambulance form complete, oxygen delivered to room and pt caregiver will take home as pt will need to transport by ambulance. The ambulance will not transport the tank with the pt. Pt [place] agency for [facility], [place] and OT, [facility] notified of plan to d/c this pt today. Care giver given Eliquis 30 day free card and MD has called prescriptions to pt pharmacy at [facility] as requested by pt daughter.  Pt AVS given to caregiver by pt RN , Lurena Joiner.  Per card given the hospital bed and wheelchair have been delivered to the pt home.   Expected Discharge Date:  08/07/2016              Expected Discharge Plan:  [place]  In-House Referral:     Discharge planning Services  CM Consult  Post Acute Care Choice:  Durable Medical Equipment, Home Health Choice offered to:  Patient [name]  (daughter) [phone])  DME Arranged:  Hospital bed, Wheelchair manual, Oxygen DME Agency:  [place]:  (S) RN, PT, OT (Patient is active with [facility] [place] has 24/7 Aides) [place]:  [place]  Status of Service:  Completed, signed off  If discussed at [place] of Stay Meetings, dates discussed:    Additional Comments:  Adron Bene, RN 08/09/2016, 12:34 PM

## 2016-08-09 NOTE — Progress Notes (Addendum)
Patient O2 sats on room air 87%at rest

## 2016-08-09 NOTE — Progress Notes (Signed)
Physical Therapy Treatment Patient Details Name: [name] MRN: [id number] DOB: April 09, 1921 Today's Date: 08/09/2016    History of Present Illness 82 y.o. 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    PT Comments    Continuing progress with activity tolerance and functional mobility; Needed supplemental O2 to keep O2 sats at acceptable levels  Follow Up Recommendations  Home health PT;Supervision/Assistance - 24 hour     Equipment Recommendations  Wheelchair (measurements PT);Wheelchair cushion (measurements PT);Hospital bed;Other (comment) (Consider ambulance transport; oxygen)    Recommendations for Other Services       Precautions / Restrictions Precautions Precautions: Fall Precaution Comments: Watch O2 sats Restrictions Weight Bearing Restrictions: No    Mobility  Bed Mobility Overal bed mobility: Needs Assistance Bed Mobility: Supine to Sit Rolling: Min assist Sidelying to sit: Mod assist       General bed mobility comments: Cues for technqiue; mod assist to elevate trunk to sit  Transfers Overall transfer level: Needs assistance Equipment used: Rolling walker (2 wheeled) Transfers: Sit to/from Stand Sit to Stand: Mod assist         General transfer comment: mod assist to power up and for balance due to posterior lean; did not fully extend LEs; cues for posture at initial stand, and for deep breathing  Ambulation/Gait Ambulation/Gait assistance: Min assist;+2 safety/equipment Ambulation Distance (Feet): 45 Feet Assistive device: Rolling walker (2 wheeled) Gait Pattern/deviations: Decreased step length - right;Decreased  step length - left     General Gait Details: Continued need for cues for posture; very short step lengths bilaterally, R steps shorter than L ones; step length and posture improved with cues to walk "like you're at home"; O2 sats low on room air, ranged 87% (observed lowest), and it did not incr with focused breathing on Room Air; re-started supplemental O2 2L   Stairs            Wheelchair Mobility    Modified Rankin (Stroke Patients Only)       Balance     Sitting balance-Leahy Scale: Fair       Standing balance-Leahy Scale: Poor                      Cognition Arousal/Alertness: Awake/alert Behavior During Therapy: WFL for tasks assessed/performed Overall Cognitive Status: History of cognitive impairments - at baseline                      Exercises      General Comments        Pertinent Vitals/Pain Pain Assessment: No/denies pain Faces Pain Scale: No hurt    Home Living                      Prior Function            PT Goals (current goals can now be found in the care plan section) Acute Rehab PT Goals Patient Stated Goal: did not state PT Goal Formulation: With patient Time For Goal Achievement: 08/20/16 Potential to Achieve Goals: Good Progress towards PT goals: Progressing toward goals    Frequency    Min 3X/week      PT Plan Current plan remains  appropriate    Co-evaluation             End of Session Equipment Utilized During Treatment: Gait belt;Other (comment) (O2 sat monitor) Activity Tolerance: Patient tolerated treatment well Patient left: in chair;with call bell/phone within reach;with family/visitor present     Time: [id number] PT Time Calculation (min) (ACUTE ONLY): 27 min  Charges:  $Gait Training: 8-22 mins $Therapeutic Activity: 8-22 mins                    G Codes:      Quin Hoop 08/09/2016, 12:26 PM  Roney Marion, [place] Pager  [phone] Office [phone]

## 2016-08-09 NOTE — Progress Notes (Signed)
Physical Therapy Treatment Note  SATURATION QUALIFICATIONS: (This note is used to comply with regulatory documentation for home oxygen)  Patient Saturations on Room Air at Rest = 87-93%  Patient Saturations on Room Air while Ambulating = 87%  Patient Saturations on 2 Liters of oxygen while Ambulating = 92-95%  Please briefly explain why patient needs home oxygen: Patient requires supplemental oxygen to maintain oxygen saturations at acceptable, safe levels with physical activity.  See also other PT note of this date for other details of session;   Roney Marion, [place] Pager [phone] Office [phone]

## 2016-08-14 ENCOUNTER — Emergency Department (HOSPITAL_COMMUNITY)
Admission: EM | Admit: 2016-08-14 | Discharge: 2016-08-14 | Disposition: A | Discharge status: Home / Self Care | Payer: Medicare Other | Attending: Physician Assistant | Admitting: Physician Assistant

## 2016-08-14 ENCOUNTER — Encounter (HOSPITAL_COMMUNITY): Payer: Self-pay

## 2016-08-14 DIAGNOSIS — Y828 Other medical devices associated with adverse incidents: Secondary | ICD-10-CM | POA: Diagnosis not present

## 2016-08-14 DIAGNOSIS — Z7901 Long term (current) use of anticoagulants: Secondary | ICD-10-CM | POA: Insufficient documentation

## 2016-08-14 DIAGNOSIS — Z85828 Personal history of other malignant neoplasm of skin: Secondary | ICD-10-CM | POA: Insufficient documentation

## 2016-08-14 DIAGNOSIS — Z79899 Other long term (current) drug therapy: Secondary | ICD-10-CM | POA: Insufficient documentation

## 2016-08-14 DIAGNOSIS — T83098A Other mechanical complication of other indwelling urethral catheter, initial encounter: Secondary | ICD-10-CM | POA: Diagnosis present

## 2016-08-14 DIAGNOSIS — Z978 Presence of other specified devices: Secondary | ICD-10-CM

## 2016-08-14 DIAGNOSIS — I10 Essential (primary) hypertension: Secondary | ICD-10-CM | POA: Insufficient documentation

## 2016-08-14 DIAGNOSIS — Z87891 Personal history of nicotine dependence: Secondary | ICD-10-CM | POA: Insufficient documentation

## 2016-08-14 DIAGNOSIS — Z960 Presence of urogenital implants: Secondary | ICD-10-CM

## 2016-08-14 HISTORY — DX: Unspecified convulsions: R56.9

## 2016-08-14 HISTORY — DX: Other pulmonary embolism without acute cor pulmonale: I26.99

## 2016-08-14 LAB — URINE MICROSCOPIC-ADD ON

## 2016-08-14 LAB — URINALYSIS, ROUTINE W REFLEX MICROSCOPIC
Glucose, UA: NEGATIVE mg/dL
Ketones, ur: 40 mg/dL — AB
LEUKOCYTES UA: NEGATIVE
NITRITE: POSITIVE — AB
PROTEIN: 100 mg/dL — AB
SPECIFIC GRAVITY, URINE: 1.017 (ref 1.005–1.030)
pH: 5 (ref 5.0–8.0)

## 2016-08-14 NOTE — ED Notes (Signed)
Discharge instructions given to caretaker.

## 2016-08-14 NOTE — ED Notes (Signed)
Pt started on Eliquis last week

## 2016-08-14 NOTE — ED Notes (Signed)
[facility] called for transportation back home ( [address], [place], [place])

## 2016-08-14 NOTE — Discharge Instructions (Signed)
Retrun as needed with any concerns.

## 2016-08-14 NOTE — ED Notes (Signed)
Bed: [id number] Expected date:  Expected time:  Means of arrival:  Comments: EMS Pressure wounds/BLE weakness

## 2016-08-14 NOTE — ED Provider Notes (Signed)
[facility] DEPT Provider Note   CSN: [id number] Arrival date & time: 08/14/16  1611     History   Chief Complaint Chief Complaint  Patient presents with  . foley cath issues    HPI [name] is a 82 y.o. male.  HPI   The patient is a 82 year old male. Past medical history including recent prolonged hospital stay in the ICU. However patient is here today because a nurse attempted to switch his Foley and found blood at the meatus. He's had no symptoms, no fevers, no change in appetite, or change in alertness.  Past Medical History:  Diagnosis Date  . Anxiety   . BPH (benign prostatic hypertrophy)   . Constipation, chronic   . Dementia    situational  . Diverticular hemorrhage 2014  . Frequency of urination   . Hemorrhoids    "lift chair helped"  . Hypertension   . Kidney stone on left side   . PE (pulmonary thromboembolism) ([place])   . Pneumonia    hx of age 64  . Seizures ([place])   . Skin cancer   . 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 Acute massive pulmonary embolism ([place]) 08/03/2016  . Hypotension 08/02/2016  . SOB (shortness of breath)   . Acute respiratory failure with hypoxia ([place])   . Syncope   . Hypocalcemia   . Encephalopathy acute   . Septic shock ([place])   . Diverticulosis of colon with hemorrhage 01/28/2013  . Stenosis, anal canal 01/28/2013  . Lower GI bleed 01/26/2013  . HTN (hypertension) 01/26/2013  . Chronic constipation 01/26/2013    Past Surgical History:  Procedure Laterality Date  . BLADDER SURGERY  1980's  . COLONOSCOPY N/A 01/28/2013   Procedure: COLONOSCOPY;  Surgeon: Jerene Bears, MD;  Location: [facility] END[facility]COPY;  Service: Gastroenterology;  Laterality: N/A;  . CYSTOSCOPY WITH RETROGRADE PYELOGRAM, URETEROSCOPY AND STENT PLACEMENT Left 03/09/2015   Procedure: CYSTOSCOPY/LEFT URETEROSCOPY/ RETROGRADE PYLEGRAM HOLMIUM LASER, STONE  BASKET/STENT PLACEMENT;  Surgeon: Festus Aloe, MD;  Location: [facility] ORS;  Service: Urology;  Laterality: Left;  . HEMORRHOID SURGERY  1980's  . SKIN CANCER EXCISION         Home Medications    Prior to Admission medications   Medication Sig Start Date End Date Taking? Authorizing Provider  apixaban (ELIQUIS) 5 MG TABS tablet Take 2 tablets (10 mg total) by mouth 2 (two) times daily. 08/09/16   Nita Sells, MD  apixaban (ELIQUIS) 5 MG TABS tablet Take 1 tablet (5 mg total) by mouth 2 (two) times daily. 08/14/16   Nita Sells, MD  DULoxetine (CYMBALTA) 60 MG capsule Take 60 mg by mouth daily. 07/31/16   Historical Provider, MD  gabapentin (NEURONTIN) 100 MG capsule Take 100 mg by mouth 3 (three) times daily. 07/31/16   Historical Provider, MD  LORazepam (ATIVAN) 0.5 MG tablet Take 0.25-0.5 mg by mouth 3 (three) times daily. For agitation or anxiety    Historical Provider, MD  losartan (COZAAR) 50 MG tablet Take 50 mg by mouth daily. 07/31/16   Historical Provider, MD  Maltodextrin-Xanthan Gum ([place]) POWD Take 1 Container by mouth as needed. 08/09/16   Nita Sells, MD  memantine (NAMENDA XR) 28 MG CP24 24 hr capsule Take 28 mg by mouth daily.    Historical Provider, MD  risperiDONE (RISPERDAL) 0.5 MG tablet Take 0.5 mg by mouth every morning.     Historical  Provider, MD  Tamsulosin HCl (FLOMAX) 0.4 MG CAPS Take 0.4 mg by mouth daily.    Historical Provider, MD    Family History History reviewed. No pertinent family history.  Social History Social History  Substance Use Topics  . Smoking status: Former Smoker    Packs/day: 2.00    Years: 10.00    Types: Cigarettes  . Smokeless tobacco: Never Used     Comment: quit years ago  . Alcohol use No     Allergies   Amoxicillin; Codeine; Feldene [piroxicam]; Ibuprofen; Indocin [indomethacin]; Morphine and related; Other; and Uroxatral [alfuzosin hcl er]   Review of Systems Review of Systems  Unable to perform ROS: Dementia    Constitutional: Negative for activity change.  Respiratory: Negative for shortness of breath.   Cardiovascular: Negative for chest pain.  Gastrointestinal: Negative for abdominal pain.  All other systems reviewed and are negative.    Physical Exam Updated Vital Signs BP 163/89 (BP Location: Left Arm)   Pulse 69   Temp 98 F (36.7 C) (Oral)   Resp 18   Ht 6' (1.829 m)   Wt 186 lb (84.4 kg)   SpO2 94%   BMI 25.23 kg/m   Physical Exam  Constitutional: He appears well-nourished.  HENT:  Head: Normocephalic.  Eyes: Conjunctivae are normal.  Cardiovascular: Normal rate.   Pulmonary/Chest: Effort normal. No respiratory distress.  Genitourinary:  Genitourinary Comments: Blood at meatus.  Neurological:  Sleeping comfortably.  Skin: Skin is warm and dry. He is not diaphoretic.  Psychiatric: He has a normal mood and affect. His behavior is normal.     ED Treatments / Results  Labs (all labs ordered are listed, but only abnormal results are displayed) Labs Reviewed  URINALYSIS, ROUTINE W REFLEX MICROSCOPIC (NOT AT [facility]) - Abnormal; Notable for the following:       Result Value   Color, Urine RED (*)    APPearance TURBID (*)    Hgb urine dipstick LARGE (*)    Bilirubin Urine LARGE (*)    Ketones, ur 40 (*)    Protein, ur 100 (*)    Nitrite POSITIVE (*)    All other components within normal limits  URINE MICROSCOPIC-ADD ON - Abnormal; Notable for the following:    Squamous Epithelial / LPF 0-5 (*)    Bacteria, UA MANY (*)    All other components within normal limits  URINE CULTURE    EKG  EKG Interpretation None       Radiology No results found.  Procedures Procedures (including critical care time)  Medications Ordered in ED Medications - No data to display   Initial Impression / Assessment and Plan / ED Course  I have reviewed the triage vital signs and the nursing notes.  Pertinent labs & imaging results that were available during my care of the  patient were reviewed by me and considered in my medical decision making (see chart for details).  Clinical Course    He is a 82 year old male with dementia, presents here because someone tried to change the Foley and found blood meatus.. We will place Foley, irrigate. Patient has no other symptoms. Therefore we'll not treat for urinary tract infection, we'll send for culture.   No indication for labs given it was purely blood at the meatus that inspired the trip here today.  No tachyacrdia.   Final Clinical Impressions(s) / ED Diagnoses   Final diagnoses:  Foley catheter in place    New Prescriptions Discharge Medication List  as of 08/14/2016  6:04 PM       Sharlynn Seckinger Julio Alm, MD 08/14/16 2307

## 2016-08-14 NOTE — ED Notes (Signed)
Foley cath in approx 4 inches. Patient had no urine in the tubing and had a small amount of blood from the penis. Foley cath balloon deflated (removed 10 ml sterile water). No additional bleeding noted at this time.Marland Kitchen

## 2016-08-14 NOTE — ED Triage Notes (Signed)
Per EMS- Patient had a home health nurse visit today and replaced a #16 foley cath. When Home Health nurse placed sterile water in the foley balloon, patient screamed in pain. Home health nurse called 911. Patient's personal caregiver reports urine has mucous and is foul-smelling.

## 2016-08-14 NOTE — ED Notes (Signed)
Pt unable to sign for discharge due to Alzheimer's diagnosis.

## 2016-08-14 NOTE — ED Notes (Addendum)
PTAR notified again regarding pt transfort.

## 2016-08-17 LAB — URINE CULTURE: Culture: >=100000 — AB

## 2016-08-18 ENCOUNTER — Emergency Department (HOSPITAL_COMMUNITY): Payer: Medicare Other

## 2016-08-18 ENCOUNTER — Inpatient Hospital Stay (HOSPITAL_COMMUNITY)
Admission: EM | Admit: 2016-08-18 | Discharge: 2016-08-24 | DRG: 689 | Disposition: A | Discharge status: Hospice | Payer: Medicare Other | Attending: Internal Medicine | Admitting: Internal Medicine

## 2016-08-18 ENCOUNTER — Encounter (HOSPITAL_COMMUNITY): Payer: Self-pay | Admitting: Emergency Medicine

## 2016-08-18 DIAGNOSIS — Z87891 Personal history of nicotine dependence: Secondary | ICD-10-CM | POA: Diagnosis not present

## 2016-08-18 DIAGNOSIS — G9340 Encephalopathy, unspecified: Secondary | ICD-10-CM | POA: Diagnosis not present

## 2016-08-18 DIAGNOSIS — Z66 Do not resuscitate: Secondary | ICD-10-CM | POA: Diagnosis present

## 2016-08-18 DIAGNOSIS — R338 Other retention of urine: Secondary | ICD-10-CM | POA: Diagnosis present

## 2016-08-18 DIAGNOSIS — L8992 Pressure ulcer of unspecified site, stage 2: Secondary | ICD-10-CM | POA: Diagnosis present

## 2016-08-18 DIAGNOSIS — G9341 Metabolic encephalopathy: Secondary | ICD-10-CM | POA: Diagnosis present

## 2016-08-18 DIAGNOSIS — N3000 Acute cystitis without hematuria: Secondary | ICD-10-CM

## 2016-08-18 DIAGNOSIS — N200 Calculus of kidney: Secondary | ICD-10-CM | POA: Diagnosis not present

## 2016-08-18 DIAGNOSIS — R319 Hematuria, unspecified: Secondary | ICD-10-CM

## 2016-08-18 DIAGNOSIS — S301XXS Contusion of abdominal wall, sequela: Secondary | ICD-10-CM

## 2016-08-18 DIAGNOSIS — L8990 Pressure ulcer of unspecified site, unspecified stage: Secondary | ICD-10-CM | POA: Insufficient documentation

## 2016-08-18 DIAGNOSIS — R1310 Dysphagia, unspecified: Secondary | ICD-10-CM | POA: Diagnosis present

## 2016-08-18 DIAGNOSIS — Z6821 Body mass index (BMI) 21.0-21.9, adult: Secondary | ICD-10-CM

## 2016-08-18 DIAGNOSIS — N401 Enlarged prostate with lower urinary tract symptoms: Secondary | ICD-10-CM | POA: Diagnosis present

## 2016-08-18 DIAGNOSIS — N132 Hydronephrosis with renal and ureteral calculous obstruction: Secondary | ICD-10-CM | POA: Diagnosis present

## 2016-08-18 DIAGNOSIS — Z79899 Other long term (current) drug therapy: Secondary | ICD-10-CM

## 2016-08-18 DIAGNOSIS — N183 Chronic kidney disease, stage 3 (moderate): Secondary | ICD-10-CM | POA: Diagnosis present

## 2016-08-18 DIAGNOSIS — I2699 Other pulmonary embolism without acute cor pulmonale: Secondary | ICD-10-CM | POA: Diagnosis not present

## 2016-08-18 DIAGNOSIS — T148XXA Other injury of unspecified body region, initial encounter: Secondary | ICD-10-CM | POA: Diagnosis not present

## 2016-08-18 DIAGNOSIS — N134 Hydroureter: Secondary | ICD-10-CM | POA: Diagnosis not present

## 2016-08-18 DIAGNOSIS — R300 Dysuria: Secondary | ICD-10-CM | POA: Diagnosis not present

## 2016-08-18 DIAGNOSIS — B964 Proteus (mirabilis) (morganii) as the cause of diseases classified elsewhere: Secondary | ICD-10-CM | POA: Diagnosis present

## 2016-08-18 DIAGNOSIS — N390 Urinary tract infection, site not specified: Secondary | ICD-10-CM | POA: Diagnosis not present

## 2016-08-18 DIAGNOSIS — R52 Pain, unspecified: Secondary | ICD-10-CM

## 2016-08-18 DIAGNOSIS — S60222A Contusion of left hand, initial encounter: Secondary | ICD-10-CM | POA: Diagnosis not present

## 2016-08-18 DIAGNOSIS — I129 Hypertensive chronic kidney disease with stage 1 through stage 4 chronic kidney disease, or unspecified chronic kidney disease: Secondary | ICD-10-CM | POA: Diagnosis present

## 2016-08-18 DIAGNOSIS — Z515 Encounter for palliative care: Secondary | ICD-10-CM | POA: Diagnosis present

## 2016-08-18 DIAGNOSIS — K5900 Constipation, unspecified: Secondary | ICD-10-CM

## 2016-08-18 DIAGNOSIS — Z7901 Long term (current) use of anticoagulants: Secondary | ICD-10-CM | POA: Diagnosis not present

## 2016-08-18 DIAGNOSIS — R627 Adult failure to thrive: Secondary | ICD-10-CM | POA: Diagnosis present

## 2016-08-18 DIAGNOSIS — F0390 Unspecified dementia without behavioral disturbance: Secondary | ICD-10-CM | POA: Diagnosis present

## 2016-08-18 DIAGNOSIS — R634 Abnormal weight loss: Secondary | ICD-10-CM | POA: Diagnosis present

## 2016-08-18 DIAGNOSIS — X58XXXS Exposure to other specified factors, sequela: Secondary | ICD-10-CM | POA: Diagnosis present

## 2016-08-18 DIAGNOSIS — N201 Calculus of ureter: Secondary | ICD-10-CM | POA: Diagnosis not present

## 2016-08-18 DIAGNOSIS — Z85828 Personal history of other malignant neoplasm of skin: Secondary | ICD-10-CM | POA: Diagnosis not present

## 2016-08-18 DIAGNOSIS — R451 Restlessness and agitation: Secondary | ICD-10-CM | POA: Diagnosis present

## 2016-08-18 DIAGNOSIS — Z86711 Personal history of pulmonary embolism: Secondary | ICD-10-CM

## 2016-08-18 DIAGNOSIS — F419 Anxiety disorder, unspecified: Secondary | ICD-10-CM | POA: Diagnosis present

## 2016-08-18 DIAGNOSIS — N1330 Unspecified hydronephrosis: Secondary | ICD-10-CM

## 2016-08-18 DIAGNOSIS — M79642 Pain in left hand: Secondary | ICD-10-CM

## 2016-08-18 LAB — COMPREHENSIVE METABOLIC PANEL
ALBUMIN: 3.3 g/dL — AB (ref 3.5–5.0)
ALT: 17 U/L (ref 17–63)
AST: 17 U/L (ref 15–41)
Alkaline Phosphatase: 62 U/L (ref 38–126)
Anion gap: 5 (ref 5–15)
BUN: 29 mg/dL — AB (ref 6–20)
CHLORIDE: 107 mmol/L (ref 101–111)
CO2: 28 mmol/L (ref 22–32)
CREATININE: 1.44 mg/dL — AB (ref 0.61–1.24)
Calcium: 9 mg/dL (ref 8.9–10.3)
GFR calc Af Amer: 47 mL/min — ABNORMAL LOW (ref >60)
GFR calc non Af Amer: 40 mL/min — ABNORMAL LOW (ref >60)
GLUCOSE: 99 mg/dL (ref 65–99)
POTASSIUM: 4 mmol/L (ref 3.5–5.1)
Sodium: 140 mmol/L (ref 135–145)
Total Bilirubin: 1.2 mg/dL (ref 0.3–1.2)
Total Protein: 6.3 g/dL — ABNORMAL LOW (ref 6.5–8.1)

## 2016-08-18 LAB — CBC WITH DIFFERENTIAL/PLATELET
BASOS ABS: 0 10*3/uL (ref 0.0–0.1)
BASOS PCT: 0 %
EOS PCT: 6 %
Eosinophils Absolute: 0.5 10*3/uL (ref 0.0–0.7)
HCT: 32 % — ABNORMAL LOW (ref 39.0–52.0)
Hemoglobin: 10.2 g/dL — ABNORMAL LOW (ref 13.0–17.0)
LYMPHS PCT: 15 %
Lymphs Abs: 1.3 10*3/uL (ref 0.7–4.0)
MCH: 27.8 pg (ref 26.0–34.0)
MCHC: 31.9 g/dL (ref 30.0–36.0)
MCV: 87.2 fL (ref 78.0–100.0)
MONO ABS: 0.6 10*3/uL (ref 0.1–1.0)
Monocytes Relative: 8 %
NEUTROS ABS: 6 10*3/uL (ref 1.7–7.7)
Neutrophils Relative %: 71 %
PLATELETS: 311 10*3/uL (ref 150–400)
RBC: 3.67 MIL/uL — AB (ref 4.22–5.81)
RDW: 15 % (ref 11.5–15.5)
WBC: 8.4 10*3/uL (ref 4.0–10.5)

## 2016-08-18 LAB — URINE MICROSCOPIC-ADD ON: SQUAMOUS EPITHELIAL / LPF: NONE SEEN

## 2016-08-18 LAB — URINALYSIS, ROUTINE W REFLEX MICROSCOPIC
Bilirubin Urine: NEGATIVE
GLUCOSE, UA: NEGATIVE mg/dL
KETONES UR: NEGATIVE mg/dL
Nitrite: POSITIVE — AB
PROTEIN: 30 mg/dL — AB
Specific Gravity, Urine: 1.014 (ref 1.005–1.030)
pH: 7 (ref 5.0–8.0)

## 2016-08-18 LAB — BLOOD GAS, VENOUS
Acid-Base Excess: 0.9 mmol/L (ref 0.0–2.0)
Bicarbonate: 26 mmol/L (ref 20.0–28.0)
O2 Saturation: 66.3 %
PATIENT TEMPERATURE: 98.6
PH VEN: 7.367 (ref 7.250–7.430)
PO2 VEN: 38.2 mmHg (ref 32.0–45.0)
pCO2, Ven: 46.4 mmHg (ref 44.0–60.0)

## 2016-08-18 LAB — AMMONIA: AMMONIA: 15 umol/L (ref 9–35)

## 2016-08-18 LAB — LIPASE, BLOOD: Lipase: 28 U/L (ref 11–51)

## 2016-08-18 MED ORDER — TAMSULOSIN HCL 0.4 MG PO CAPS
0.4000 mg | ORAL_CAPSULE | Freq: Every day | ORAL | Status: DC
  Administered 2016-08-23: 0.4 mg via ORAL
  Filled 2016-08-18 (×2): qty 1

## 2016-08-18 MED ORDER — ACETAMINOPHEN 500 MG PO TABS
1000.0000 mg | ORAL_TABLET | Freq: Once | ORAL | Status: AC
  Administered 2016-08-18: 1000 mg via ORAL
  Filled 2016-08-18: qty 2

## 2016-08-18 MED ORDER — ONDANSETRON HCL 4 MG/2ML IJ SOLN
4.0000 mg | Freq: Four times a day (QID) | INTRAMUSCULAR | Status: DC | PRN
  Administered 2016-08-19: 4 mg via INTRAVENOUS
  Filled 2016-08-18: qty 2

## 2016-08-18 MED ORDER — ONDANSETRON HCL 4 MG/2ML IJ SOLN
4.0000 mg | Freq: Once | INTRAMUSCULAR | Status: AC
  Administered 2016-08-18: 4 mg via INTRAVENOUS
  Filled 2016-08-18: qty 2

## 2016-08-18 MED ORDER — MEMANTINE HCL ER 28 MG PO CP24
28.0000 mg | ORAL_CAPSULE | Freq: Every day | ORAL | Status: DC
  Administered 2016-08-23: 28 mg via ORAL
  Filled 2016-08-18 (×5): qty 1

## 2016-08-18 MED ORDER — IOPAMIDOL (ISOVUE-300) INJECTION 61%
100.0000 mL | Freq: Once | INTRAVENOUS | Status: AC | PRN
  Administered 2016-08-18: 80 mL via INTRAVENOUS

## 2016-08-18 MED ORDER — MORPHINE SULFATE (PF) 2 MG/ML IV SOLN
2.0000 mg | Freq: Once | INTRAVENOUS | Status: AC
  Administered 2016-08-18: 2 mg via INTRAVENOUS
  Filled 2016-08-18: qty 1

## 2016-08-18 MED ORDER — CEFTRIAXONE SODIUM 1 G IJ SOLR
1.0000 g | Freq: Once | INTRAMUSCULAR | Status: AC
  Administered 2016-08-18: 1 g via INTRAVENOUS
  Filled 2016-08-18: qty 10

## 2016-08-18 MED ORDER — ACETAMINOPHEN 325 MG PO TABS
650.0000 mg | ORAL_TABLET | Freq: Four times a day (QID) | ORAL | Status: DC | PRN
  Administered 2016-08-22: 650 mg via ORAL
  Filled 2016-08-18: qty 2

## 2016-08-18 MED ORDER — SODIUM CHLORIDE 0.9 % IV SOLN
INTRAVENOUS | Status: DC
  Administered 2016-08-18 – 2016-08-22 (×6): via INTRAVENOUS

## 2016-08-18 MED ORDER — LORAZEPAM 0.5 MG PO TABS
0.5000 mg | ORAL_TABLET | Freq: Every day | ORAL | Status: DC
  Administered 2016-08-18 – 2016-08-22 (×2): 0.5 mg via ORAL
  Filled 2016-08-18 (×3): qty 1

## 2016-08-18 MED ORDER — DULOXETINE HCL 30 MG PO CPEP
60.0000 mg | ORAL_CAPSULE | Freq: Every day | ORAL | Status: DC
  Administered 2016-08-23: 60 mg via ORAL
  Filled 2016-08-18 (×2): qty 2

## 2016-08-18 MED ORDER — GABAPENTIN 100 MG PO CAPS
100.0000 mg | ORAL_CAPSULE | Freq: Three times a day (TID) | ORAL | Status: DC
  Administered 2016-08-18 – 2016-08-23 (×2): 100 mg via ORAL
  Filled 2016-08-18 (×3): qty 1

## 2016-08-18 MED ORDER — DEXTROSE 5 % IV SOLN
1.0000 g | INTRAVENOUS | Status: DC
  Administered 2016-08-19 – 2016-08-20 (×2): 1 g via INTRAVENOUS
  Filled 2016-08-18 (×3): qty 10

## 2016-08-18 MED ORDER — RISPERIDONE 0.25 MG PO TABS
0.5000 mg | ORAL_TABLET | Freq: Every morning | ORAL | Status: DC
  Filled 2016-08-18: qty 2

## 2016-08-18 MED ORDER — SODIUM CHLORIDE 0.9 % IV BOLUS (SEPSIS)
1000.0000 mL | Freq: Once | INTRAVENOUS | Status: AC
  Administered 2016-08-18: 1000 mL via INTRAVENOUS

## 2016-08-18 MED ORDER — LOSARTAN POTASSIUM 50 MG PO TABS: 50.0000 mg | ORAL_TABLET | Freq: Every day | ORAL | Status: DC

## 2016-08-18 MED ORDER — LORAZEPAM 0.5 MG PO TABS: 0.2500 mg | ORAL_TABLET | Freq: Three times a day (TID) | ORAL | Status: DC | PRN

## 2016-08-18 MED ORDER — APIXABAN 5 MG PO TABS
5.0000 mg | ORAL_TABLET | Freq: Two times a day (BID) | ORAL | Status: DC
  Administered 2016-08-18: 5 mg via ORAL
  Filled 2016-08-18: qty 1

## 2016-08-18 MED ORDER — ACETAMINOPHEN 650 MG RE SUPP
650.0000 mg | Freq: Four times a day (QID) | RECTAL | Status: DC | PRN
  Administered 2016-08-22: 650 mg via RECTAL
  Filled 2016-08-18: qty 1

## 2016-08-18 MED ORDER — RESOURCE THICKENUP CLEAR PO POWD
1.0000 | ORAL | Status: DC | PRN
  Filled 2016-08-18: qty 125

## 2016-08-18 MED ORDER — ONDANSETRON HCL 4 MG PO TABS: 4.0000 mg | ORAL_TABLET | Freq: Four times a day (QID) | ORAL | Status: DC | PRN

## 2016-08-18 NOTE — Progress Notes (Signed)
ED Antimicrobial Stewardship Positive Culture Follow Up   [name] is an 82 y.o. male who presented to [facility] on 08/14/2016 with a chief complaint of  Chief Complaint  Patient presents with  . foley cath issues    Recent Results (from the past 720 hour(s))  Urine culture     Status: Abnormal   Collection Time: 08/02/16  5:06 PM  Result Value Ref Range Status   Specimen Description URINE, CATHETERIZED  Final   Special Requests NONE  Final   Culture <10,000 COLONIES/mL INSIGNIFICANT GROWTH (A)  Final   Report Status 08/03/2016 FINAL  Final  Blood culture (routine x 2)     Status: None   Collection Time: 08/02/16  6:20 PM  Result Value Ref Range Status   Specimen Description BLOOD RIGHT HAND  Final   Special Requests BOTTLES DRAWN AEROBIC AND ANAEROBIC 5CC  Final   Culture NO GROWTH 5 DAYS  Final   Report Status 08/07/2016 FINAL  Final  Blood culture (routine x 2)     Status: None   Collection Time: 08/02/16  6:30 PM  Result Value Ref Range Status   Specimen Description BLOOD LEFT HAND  Final   Special Requests BOTTLES DRAWN AEROBIC AND ANAEROBIC 5CC  Final   Culture NO GROWTH 5 DAYS  Final   Report Status 08/07/2016 FINAL  Final  MRSA PCR Screening     Status: None   Collection Time: 08/02/16 11:17 PM  Result Value Ref Range Status   MRSA by PCR NEGATIVE NEGATIVE Final    Comment:        The GeneXpert MRSA Assay (FDA approved for NASAL specimens only), is one component of a comprehensive MRSA colonization surveillance program. It is not intended to diagnose MRSA infection nor to guide or monitor treatment for MRSA infections.   Urine culture     Status: Abnormal   Collection Time: 08/14/16  6:05 PM  Result Value Ref Range Status   Specimen Description URINE, CATHETERIZED  Final   Special Requests NONE  Final   Culture >=100,000 COLONIES/mL PROTEUS MIRABILIS (A)  Final   Report Status 08/17/2016 FINAL  Final   Organism ID, Bacteria PROTEUS MIRABILIS (A)   Final      Susceptibility   Proteus mirabilis - MIC*    AMPICILLIN <=2 SENSITIVE Sensitive     CEFAZOLIN <=4 SENSITIVE Sensitive     CEFTRIAXONE <=1 SENSITIVE Sensitive     CIPROFLOXACIN <=0.25 SENSITIVE Sensitive     GENTAMICIN <=1 SENSITIVE Sensitive     IMIPENEM 2 SENSITIVE Sensitive     NITROFURANTOIN 128 RESISTANT Resistant     TRIMETH/SULFA <=20 SENSITIVE Sensitive     AMPICILLIN/SULBACTAM <=2 SENSITIVE Sensitive     PIP/TAZO <=4 SENSITIVE Sensitive     * >=100,000 COLONIES/mL PROTEUS MIRABILIS    Patient's urine culture resulted in proteus mirabilus, however patient was asymptomatic and is likely colonized (recent ICU stay, chronic foley catheter). Discussed with ED PA, no treatment indicated.   ED Provider: Lorre Munroe, PA  Carlean Jews, Pharm.D. PGY1 Pharmacy Resident 11/3/20179:24 AM Pager [phone] Phone# [phone]

## 2016-08-18 NOTE — ED Triage Notes (Signed)
Per EMS, patient has been having cloudy dark urine since Monday. Patient was seen for the same on Monday. Patient has a Foley Catheter in place. Care giver at bedside. Patient is from home.

## 2016-08-18 NOTE — ED Notes (Signed)
Attempted report to 5E. RN unavailable. 

## 2016-08-18 NOTE — ED Notes (Signed)
2 sets of blood cultures obtained prior to antibiotic administration 

## 2016-08-18 NOTE — Progress Notes (Signed)
Pharmacy Antibiotic Follow-up Note  [name] is a 82 y.o. year-old male admitted on 08/18/2016.  The patient is currently on day 1 of Rocephin for UTI.  Assessment/Plan: This patient's current antibiotics will be continued without adjustments.  Rocephin needs no adjustment, not renally excreted Pharmacy will sign off  Temp (24hrs), Avg:97.6 F (36.4 C), Min:97.5 F (36.4 C), Max:97.6 F (36.4 C)   Recent Labs Lab 08/18/16 1309  WBC 8.4    Recent Labs Lab 08/18/16 1309  CREATININE 1.44*   Estimated Creatinine Clearance: 35.2 mL/min (by C-G formula based on SCr of 1.44 mg/dL (H)).    Allergies  Allergen Reactions  . Amoxicillin Diarrhea  . Codeine Other (See Comments)  . Feldene [Piroxicam] Other (See Comments)  . Ibuprofen Other (See Comments)    H/o bleeding (per pt's daughter)  . Indocin [Indomethacin] Other (See Comments)  . Morphine And Related Nausea And Vomiting  . Other     omnicor- unknown reaction  . Uroxatral [Alfuzosin Hcl Er] Other (See Comments)   Antimicrobials this admission: 11/3 Ceftriaxone >>   Microbiology results: 11/3 BCx: sent 11/3 UCx: sent   Thank you for allowing pharmacy to be a part of this patient's care.  Minda Ditto PharmD 08/18/2016 5:44 PM

## 2016-08-18 NOTE — ED Notes (Signed)
Bed: WA04 Expected date:  Expected time:  Means of arrival:  Comments: EMS-UTI 

## 2016-08-18 NOTE — H&P (Signed)
TRH H&P    Patient Demographics:    [name], is a 82 y.o. male  MRN: [id number]  DOB - 12-Nov-1920  Admit Date - 08/18/2016  Referring MD/NP/PA: Dr. Tyrone Nine  Outpatient Primary MD for the patient is Edward Crutch, MD  Patient coming from: Home  Chief Complaint  Patient presents with  . Dysuria      HPI:    [name]  is a 82 y.o. male, With history of massive pulmonary embolism, on chronic anticoagulation with eliquis, dementia, urinary retention status post Foley catheter placement, dysphagia was brought to the hospital from home after home health RN noted patient to have cloudy urine in the Foley catheter. Patient has been confused over the past few days, he does have history of dementia but at baseline he is able to communicate with his daughter. He also has had poor by mouth intake over the past few days. He had loose bowel movements yesterday.  In the ED patient is unable to provide any history. All history obtained from patient's daughter at bedside. There has been no fever, no chest pain or shortness of breath. He had been complaining of abdominal pain. CT abdomen pelvis shows right rectus sheath hematoma which is decreasing in size. UA was abnormal in the ED, with positive nitrite, 6-30 WBCs per high-power field.  Patient empirically started on ceftriaxone for UTI. Urine culture and blood cultures obtained. Patient received morphine in the ED and has been very somnolent since then.    Review of systems:   Unable to obtain review of systems as patient is very somnolent at this time   With Past History of the following :    Past Medical History:  Diagnosis Date  . Anxiety   . BPH (benign prostatic hypertrophy)   . Constipation, chronic   . Dementia    situational  . Diverticular hemorrhage 2014  . Frequency of urination   . Hemorrhoids    "lift chair helped"  . Hypertension   . Kidney  stone on left side   . PE (pulmonary thromboembolism) ([place])   . Pneumonia    hx of age 55  . Seizures ([place])   . Skin cancer   . Transient global amnesia 15-20 years ago      Past Surgical History:  Procedure Laterality Date  . BLADDER SURGERY  1980's  . COLONOSCOPY N/A 01/28/2013   Procedure: COLONOSCOPY;  Surgeon: Jerene Bears, MD;  Location: [facility];  Service: Gastroenterology;  Laterality: N/A;  . CYSTOSCOPY WITH RETROGRADE PYELOGRAM, URETEROSCOPY AND STENT PLACEMENT Left 03/09/2015   Procedure: CYSTOSCOPY/LEFT URETEROSCOPY/ RETROGRADE PYLEGRAM HOLMIUM LASER, STONE  BASKET/STENT PLACEMENT;  Surgeon: Festus Aloe, MD;  Location: [facility];  Service: Urology;  Laterality: Left;  . HEMORRHOID SURGERY  1980's  . SKIN CANCER EXCISION        Social History:      Social History  Substance Use Topics  . Smoking status: Former Smoker    Packs/day: 2.00    Years: 10.00    Types: Cigarettes  . Smokeless  tobacco: Never Used     Comment: quit years ago  . Alcohol use No       Family History :   No pertinent family history   Home Medications:   Prior to Admission medications   Medication Sig Start Date End Date Taking? Authorizing Provider  apixaban (ELIQUIS) 5 MG TABS tablet Take 1 tablet (5 mg total) by mouth 2 (two) times daily. 08/14/16  Yes Nita Sells, MD  DULoxetine (CYMBALTA) 60 MG capsule Take 60 mg by mouth daily with breakfast.  07/31/16  Yes Historical Provider, MD  gabapentin (NEURONTIN) 100 MG capsule Take 100 mg by mouth 3 (three) times daily. 07/31/16  Yes Historical Provider, MD  gabapentin (NEURONTIN) 300 MG capsule Take 300 mg by mouth at bedtime.   Yes Historical Provider, MD  LORazepam (ATIVAN) 0.5 MG tablet Take 0.25-0.5 mg by mouth 3 (three) times daily as needed for anxiety. For agitation or anxiety   Yes Historical Provider, MD  LORazepam (ATIVAN) 0.5 MG tablet Take 0.5 mg by mouth at bedtime.   Yes Historical Provider, MD  losartan (COZAAR)  50 MG tablet Take 50 mg by mouth daily with breakfast.  07/31/16  Yes Historical Provider, MD  Maltodextrin-Xanthan Gum ([place]) POWD Take 1 Container by mouth as needed. 08/09/16  Yes Nita Sells, MD  memantine (NAMENDA XR) 28 MG CP24 24 hr capsule Take 28 mg by mouth daily at 12 noon.    Yes Historical Provider, MD  risperiDONE (RISPERDAL) 0.5 MG tablet Take 0.5 mg by mouth every morning.    Yes Historical Provider, MD  Tamsulosin HCl (FLOMAX) 0.4 MG CAPS Take 0.4 mg by mouth daily after breakfast.    Yes Historical Provider, MD  apixaban (ELIQUIS) 5 MG TABS tablet Take 2 tablets (10 mg total) by mouth 2 (two) times daily. Patient not taking: Reported on 08/18/2016 08/09/16   Nita Sells, MD     Allergies:     Allergies  Allergen Reactions  . Amoxicillin Diarrhea  . Codeine Other (See Comments)  . Feldene [Piroxicam] Other (See Comments)  . Ibuprofen Other (See Comments)    H/o bleeding (per pt's daughter)  . Indocin [Indomethacin] Other (See Comments)  . Morphine And Related Nausea And Vomiting  . Other     omnicor- unknown reaction  . Uroxatral [Alfuzosin Hcl Er] Other (See Comments)     Physical Exam:   Vitals  Blood pressure 107/60, pulse 63, temperature 97.5 F (36.4 C), temperature source Oral, resp. rate 18, SpO2 100 %.  1.  General: Somnolent, barely opens eyes to painful stimuli.   2. Psychiatric: Not tested  3. Neurologic: Not tested, patient very somnolent  4. Eyes : Not tested  5. ENMT:  Oropharynx clear with moist mucous membranes dry  6. Neck:  supple, no cervical lymphadenopathy appriciated, No thyromegaly  7. Respiratory : Normal respiratory effort, good air movement bilaterally,clear to  auscultation bilaterally  8. Cardiovascular : RRR, no gallops, rubs or murmurs, no leg edema  9. Gastrointestinal:  Positive bowel sounds, abdomen soft, non-tender to palpation, right side mass noted extending from right  upper quadrant to lower quadrant.        Data Review:    CBC  Recent Labs Lab 08/18/16 1309  WBC 8.4  HGB 10.2*  HCT 32.0*  PLT 311  MCV 87.2  MCH 27.8  MCHC 31.9  RDW 15.0  LYMPHSABS 1.3  MONOABS 0.6  EOSABS 0.5  BASOSABS 0.0   ------------------------------------------------------------------------------------------------------------------  Chemistries  Recent Labs Lab 08/18/16 1309  NA 140  K 4.0  CL 107  CO2 28  GLUCOSE 99  BUN 29*  CREATININE 1.44*  CALCIUM 9.0  AST 17  ALT 17  ALKPHOS 62  BILITOT 1.2   ------------------------------------------------------------------------------------------------------------------  ------------------------------------------------------------------------------------------------------------------ GFR: Estimated Creatinine Clearance: 35.2 mL/min (by C-G formula based on SCr of 1.44 mg/dL (H)). Liver Function Tests:  Recent Labs Lab 08/18/16 1309  AST 17  ALT 17  ALKPHOS 62  BILITOT 1.2  PROT 6.3*  ALBUMIN 3.3*    Recent Labs Lab 08/18/16 1309  LIPASE 28    Recent Labs Lab 08/18/16 1531  AMMONIA 15    --------------------------------------------------------------------------------------------------------------- Urine analysis:    Component Value Date/Time   COLORURINE AMBER (A) 08/18/2016 1347   APPEARANCEUR CLOUDY (A) 08/18/2016 1347   LABSPEC 1.014 08/18/2016 1347   PHURINE 7.0 08/18/2016 1347   GLUCOSEU NEGATIVE 08/18/2016 1347   HGBUR MODERATE (A) 08/18/2016 1347   BILIRUBINUR NEGATIVE 08/18/2016 1347   KETONESUR NEGATIVE 08/18/2016 1347   PROTEINUR 30 (A) 08/18/2016 1347   UROBILINOGEN 0.2 07/18/2014 1953   NITRITE POSITIVE (A) 08/18/2016 1347   LEUKOCYTESUR LARGE (A) 08/18/2016 1347      Imaging Results:    Dg Chest 2 View  Result Date: 08/18/2016 CLINICAL DATA:  Altered mental status. History of dementia, hypertension and pneumonia. EXAM: CHEST  2 VIEW COMPARISON:  CT chest  August 03, 2016 FINDINGS: The cardiac silhouette is moderately enlarged. Calcified aortic knob. Small to moderate layering pleural effusion seen on the lateral radiograph. Mild bronchitic changes. Granuloma projects in LEFT upper lobe. No pneumothorax. Osteopenia. Moderate degenerative change of the spine. IMPRESSION: Small to moderate layering pleural effusion. Mild bronchitic changes. Stable cardiomegaly. Electronically Signed   By: Elon Alas M.D.   On: 08/18/2016 15:20   Ct Abdomen Pelvis W Contrast  Result Date: 08/18/2016 CLINICAL DATA:  Cloudy dark urine since Monday. EXAM: CT ABDOMEN AND PELVIS WITH CONTRAST TECHNIQUE: Multidetector CT imaging of the abdomen and pelvis was performed using the standard protocol following bolus administration of intravenous contrast. CONTRAST:  8mL ISOVUE-300 IOPAMIDOL (ISOVUE-300) INJECTION 61% COMPARISON:  August 07, 2016 FINDINGS: Lower chest: Small bilateral pleural effusions identified. Coronary artery calcifications. No other lung base abnormalities. Hepatobiliary: There is a probable tiny cyst in the lower right hepatic lobe on series 2, image 33, too small to characterize. No suspicious hepatic masses. Limited views of the portal vein are normal. The gallbladder is unremarkable. Pancreas: Unremarkable. No pancreatic ductal dilatation or surrounding inflammatory changes. Spleen: Splenic calcified granulomas.  No other abnormalities. Adrenals/Urinary Tract: The adrenal glands are normal. There is a small neoplasm off the posterior lower pole of the right kidney on series 2, image 41, not significantly changed since 2015. There is an indeterminate exophytic lesion off the mid right kidney posteriorly on series 2, image 34. No suspicious solid masses are seen on the left. No hydronephrosis on the right. No right ureteral stones. There is mild hydronephrosis on the left and increasing prominence of the left renal pelvis. There is increased attenuation in the  fat around the left renal pelvis and enhancement of the urothelium of the left renal pelvis and ureter. The ureter is newly dilated and there is a tiny punctate stone measuring 2 mm in the distal left ureter on series 2, image 77. The bladder is decompressed with a Foley catheter. There is a cyst off the upper pole left kidney. Stomach/Bowel: The stomach and small bowel are normal. The stool  ball seen on the previous study has resolved. Fecal loading remains throughout the colon, decreased in the interval. A few scattered colonic diverticuli are seen without diverticulitis. The appendix is normal in appearance. Vascular/Lymphatic: Atherosclerotic change seen throughout the aorta and branching vessels. No aneurysm or dissection. No adenopathy. Reproductive: Prostate is unremarkable. Other: The right rectus sheath hematoma persists but is smaller in the interval. Stranding in blood products are seen in the right pelvis on series 2, image 72, also decreased. No free air or free fluid. Musculoskeletal: No bony changes. IMPRESSION: 1. Mild hydronephrosis associated with the left kidney with increasing dilatation of the left renal pelvis and left ureter. There is a tiny stone in the distal left ureter. Enhancement of the urothelium of the renal pelvis and ureter could be inflammatory or infectious. The bladder is now decompressed with a Foley catheter. 2. Probable renal cell carcinoma off the lower pole the right kidney unchanged since 2015. 1 or 2 other indeterminate lesions are associated with the right kidney. These findings are similar since 2015. 3. The right rectus sheath hematoma and its sequela are decreasing. 4. Atherosclerosis. Electronically Signed   By: Dorise Bullion III M.D   On: 08/18/2016 15:33       Assessment & Plan:    Active Problems:   Encephalopathy acute   UTI (urinary tract infection)   1. UTI- patient started on Rocephin in the ED, continue Rocephin per pharmacy consultation. Follow  urine culture results 2. Altered mental status- patient has delirium superimposed on underlying dementia, likely from UTI. Started on IV antibiotics as above. 3. History of massive pulmonary embolism- stable, continue eliquis 5 mg by mouth twice a day 4. Dementia- continue Namenda, Risperdal 5. History of BPH- patient has Foley catheter in place due to urinary retention, continue tamsulosin. 6. Hypertension- blood pressure stable, continue Cozaar 50 minute grams by mouth daily    DVT Prophylaxis-   Eliquis  AM Labs Ordered, also please review Full Orders  Family Communication: Admission, patients condition and plan of care including tests being ordered have been discussed with the patient's daughter at bedside who indicate understanding and agree with the plan and Code Status.  Code Status:  DO NOT RESUSCITATE  Admission status: Inpatient    Time spent in minutes : 60 minutes   Contrell Ballentine S M.D on 08/18/2016 at 4:32 PM  Between 7am to 7pm - Pager - [phone]. After 7pm go to [email or web address] - password [facility]  [facility] - Office  [phone]

## 2016-08-18 NOTE — ED Provider Notes (Signed)
[facility] DEPT Provider Note   CSN: [id number] Arrival date & time: 08/18/16  1155     History   Chief Complaint Chief Complaint  Patient presents with  . Dysuria    HPI [name] is a 82 y.o. male.  3 days of delirium, not recognizing family, hallucinating.  Change in urine color, some increased sediment and though decreased output.  Complaining of lower abdominal pain.  Recently found to have urinary retention, now with indwelling foley.  Later found to have PE, now on eliquis.    The history is provided by the patient, a relative and the EMS personnel. The history is limited by the condition of the patient.  Dysuria   This is a new problem. The current episode started more than 2 days ago. The problem has not changed since onset.The pain is at a severity of 7/10. The pain is moderate. There has been no fever. The fever has been present for less than 1 day. He is not sexually active. There is no history of pyelonephritis. Pertinent negatives include no chills and no vomiting. He has tried nothing for the symptoms. His past medical history is significant for catheterization.  Illness  This is a recurrent problem. The current episode started more than 2 days ago. The problem occurs constantly. The problem has not changed since onset.Pertinent negatives include no chest pain, no abdominal pain, no headaches and no shortness of breath.    Past Medical History:  Diagnosis Date  . Anxiety   . BPH (benign prostatic hypertrophy)   . Constipation, chronic   . Dementia    situational  . Diverticular hemorrhage 2014  . Frequency of urination   . Hemorrhoids    "lift chair helped"  . Hypertension   . Kidney stone on left side   . PE (pulmonary thromboembolism) ([place])   . Pneumonia    hx of age 79  . Seizures ([place])   . Skin cancer   . 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 UTI (urinary tract infection)  08/18/2016  . Pressure injury of skin 08/18/2016  . Acute massive pulmonary embolism ([place]) 08/03/2016  . Hypotension 08/02/2016  . SOB (shortness of breath)   . Acute respiratory failure with hypoxia ([place])   . Syncope   . Hypocalcemia   . Encephalopathy acute   . Septic shock ([place])   . Diverticulosis of colon with hemorrhage 01/28/2013  . Stenosis, anal canal 01/28/2013  . Lower GI bleed 01/26/2013  . HTN (hypertension) 01/26/2013  . Chronic constipation 01/26/2013    Past Surgical History:  Procedure Laterality Date  . BLADDER SURGERY  1980's  . COLONOSCOPY N/A 01/28/2013   Procedure: COLONOSCOPY;  Surgeon: Jerene Bears, MD;  Location: [facility];  Service: Gastroenterology;  Laterality: N/A;  . CYSTOSCOPY WITH RETROGRADE PYELOGRAM, URETEROSCOPY AND STENT PLACEMENT Left 03/09/2015   Procedure: CYSTOSCOPY/LEFT URETEROSCOPY/ RETROGRADE PYLEGRAM HOLMIUM LASER, STONE  BASKET/STENT PLACEMENT;  Surgeon: Festus Aloe, MD;  Location: [facility];  Service: Urology;  Laterality: Left;  . HEMORRHOID SURGERY  1980's  . SKIN CANCER EXCISION         Home Medications    Prior to Admission medications   Medication Sig Start Date End Date Taking? Authorizing Provider  apixaban (ELIQUIS) 5 MG TABS tablet Take 1 tablet (5 mg total) by mouth 2 (two) times daily. 08/14/16  Yes Nita Sells, MD  DULoxetine (CYMBALTA) 60 MG capsule  Take 60 mg by mouth daily with breakfast.  07/31/16  Yes Historical Provider, MD  gabapentin (NEURONTIN) 100 MG capsule Take 100 mg by mouth 3 (three) times daily. 07/31/16  Yes Historical Provider, MD  gabapentin (NEURONTIN) 300 MG capsule Take 300 mg by mouth at bedtime.   Yes Historical Provider, MD  LORazepam (ATIVAN) 0.5 MG tablet Take 0.25-0.5 mg by mouth 3 (three) times daily as needed for anxiety. For agitation or anxiety   Yes Historical Provider, MD  LORazepam (ATIVAN) 0.5 MG tablet Take 0.5 mg by mouth at bedtime.   Yes Historical Provider, MD    losartan (COZAAR) 50 MG tablet Take 50 mg by mouth daily with breakfast.  07/31/16  Yes Historical Provider, MD  Maltodextrin-Xanthan Gum ([place]) POWD Take 1 Container by mouth as needed. 08/09/16  Yes Nita Sells, MD  memantine (NAMENDA XR) 28 MG CP24 24 hr capsule Take 28 mg by mouth daily at 12 noon.    Yes Historical Provider, MD  risperiDONE (RISPERDAL) 0.5 MG tablet Take 0.5 mg by mouth every morning.    Yes Historical Provider, MD  Tamsulosin HCl (FLOMAX) 0.4 MG CAPS Take 0.4 mg by mouth daily after breakfast.    Yes Historical Provider, MD  apixaban (ELIQUIS) 5 MG TABS tablet Take 2 tablets (10 mg total) by mouth 2 (two) times daily. Patient not taking: Reported on 08/18/2016 08/09/16   Nita Sells, MD    Family History No family history on file.  Social History Social History  Substance Use Topics  . Smoking status: Former Smoker    Packs/day: 2.00    Years: 10.00    Types: Cigarettes  . Smokeless tobacco: Never Used     Comment: quit years ago  . Alcohol use No     Allergies   Amoxicillin; Codeine; Feldene [piroxicam]; Ibuprofen; Indocin [indomethacin]; Morphine and related; Other; and Uroxatral [alfuzosin hcl er]   Review of Systems Review of Systems  Constitutional: Negative for chills and fever.  HENT: Negative for congestion and facial swelling.   Eyes: Negative for discharge and visual disturbance.  Respiratory: Negative for shortness of breath.   Cardiovascular: Negative for chest pain and palpitations.  Gastrointestinal: Negative for abdominal pain, diarrhea and vomiting.  Genitourinary: Positive for decreased urine volume and dysuria.  Musculoskeletal: Negative for arthralgias and myalgias.  Skin: Negative for color change and rash.  Neurological: Negative for tremors, syncope and headaches.       Confusion  Psychiatric/Behavioral: Negative for confusion and dysphoric mood.     Physical Exam Updated Vital Signs BP  113/63 (BP Location: Left Arm)   Pulse 67   Temp 97.6 F (36.4 C) (Axillary)   Resp 16   Ht 6' (1.829 m)   Wt 160 lb 0.9 oz (72.6 kg)   SpO2 100%   BMI 21.71 kg/m   Physical Exam  Constitutional: He appears well-developed and well-nourished.  HENT:  Head: Normocephalic and atraumatic.  Eyes: EOM are normal. Pupils are equal, round, and reactive to light.  Neck: Normal range of motion. Neck supple. No JVD present.  Cardiovascular: Normal rate and regular rhythm.  Exam reveals no gallop and no friction rub.   No murmur heard. Pulmonary/Chest: No respiratory distress. He has no wheezes.  Abdominal: He exhibits no distension and no mass. There is no tenderness. There is no rebound and no guarding.  Musculoskeletal: Normal range of motion.  Neurological: He is alert.  Confused to situation  Skin: No rash noted. No pallor.  Psychiatric: He has a normal mood and affect. His behavior is normal.  Nursing note and vitals reviewed.    ED Treatments / Results  Labs (all labs ordered are listed, but only abnormal results are displayed) Labs Reviewed  URINALYSIS, ROUTINE W REFLEX MICROSCOPIC (NOT AT [facility]) - Abnormal; Notable for the following:       Result Value   Color, Urine AMBER (*)    APPearance CLOUDY (*)    Hgb urine dipstick MODERATE (*)    Protein, ur 30 (*)    Nitrite POSITIVE (*)    Leukocytes, UA LARGE (*)    All other components within normal limits  CBC WITH DIFFERENTIAL/PLATELET - Abnormal; Notable for the following:    RBC 3.67 (*)    Hemoglobin 10.2 (*)    HCT 32.0 (*)    All other components within normal limits  COMPREHENSIVE METABOLIC PANEL - Abnormal; Notable for the following:    BUN 29 (*)    Creatinine, Ser 1.44 (*)    Total Protein 6.3 (*)    Albumin 3.3 (*)    GFR calc non Af Amer 40 (*)    GFR calc Af Amer 47 (*)    All other components within normal limits  URINE MICROSCOPIC-ADD ON - Abnormal; Notable for the following:    Bacteria, UA FEW (*)      All other components within normal limits  URINE CULTURE  CULTURE, BLOOD (ROUTINE X 2)  CULTURE, BLOOD (ROUTINE X 2)  LIPASE, BLOOD  BLOOD GAS, VENOUS  AMMONIA  CBC  COMPREHENSIVE METABOLIC PANEL    EKG  EKG Interpretation None       Radiology Dg Chest 2 View  Result Date: 08/18/2016 CLINICAL DATA:  Altered mental status. History of dementia, hypertension and pneumonia. EXAM: CHEST  2 VIEW COMPARISON:  CT chest August 03, 2016 FINDINGS: The cardiac silhouette is moderately enlarged. Calcified aortic knob. Small to moderate layering pleural effusion seen on the lateral radiograph. Mild bronchitic changes. Granuloma projects in LEFT upper lobe. No pneumothorax. Osteopenia. Moderate degenerative change of the spine. IMPRESSION: Small to moderate layering pleural effusion. Mild bronchitic changes. Stable cardiomegaly. Electronically Signed   By: Elon Alas M.D.   On: 08/18/2016 15:20   Ct Abdomen Pelvis W Contrast  Result Date: 08/18/2016 CLINICAL DATA:  Cloudy dark urine since Monday. EXAM: CT ABDOMEN AND PELVIS WITH CONTRAST TECHNIQUE: Multidetector CT imaging of the abdomen and pelvis was performed using the standard protocol following bolus administration of intravenous contrast. CONTRAST:  103mL ISOVUE-300 IOPAMIDOL (ISOVUE-300) INJECTION 61% COMPARISON:  August 07, 2016 FINDINGS: Lower chest: Small bilateral pleural effusions identified. Coronary artery calcifications. No other lung base abnormalities. Hepatobiliary: There is a probable tiny cyst in the lower right hepatic lobe on series 2, image 33, too small to characterize. No suspicious hepatic masses. Limited views of the portal vein are normal. The gallbladder is unremarkable. Pancreas: Unremarkable. No pancreatic ductal dilatation or surrounding inflammatory changes. Spleen: Splenic calcified granulomas.  No other abnormalities. Adrenals/Urinary Tract: The adrenal glands are normal. There is a small neoplasm off the  posterior lower pole of the right kidney on series 2, image 41, not significantly changed since 2015. There is an indeterminate exophytic lesion off the mid right kidney posteriorly on series 2, image 34. No suspicious solid masses are seen on the left. No hydronephrosis on the right. No right ureteral stones. There is mild hydronephrosis on the left and increasing prominence of the left renal pelvis. There is increased  attenuation in the fat around the left renal pelvis and enhancement of the urothelium of the left renal pelvis and ureter. The ureter is newly dilated and there is a tiny punctate stone measuring 2 mm in the distal left ureter on series 2, image 77. The bladder is decompressed with a Foley catheter. There is a cyst off the upper pole left kidney. Stomach/Bowel: The stomach and small bowel are normal. The stool ball seen on the previous study has resolved. Fecal loading remains throughout the colon, decreased in the interval. A few scattered colonic diverticuli are seen without diverticulitis. The appendix is normal in appearance. Vascular/Lymphatic: Atherosclerotic change seen throughout the aorta and branching vessels. No aneurysm or dissection. No adenopathy. Reproductive: Prostate is unremarkable. Other: The right rectus sheath hematoma persists but is smaller in the interval. Stranding in blood products are seen in the right pelvis on series 2, image 72, also decreased. No free air or free fluid. Musculoskeletal: No bony changes. IMPRESSION: 1. Mild hydronephrosis associated with the left kidney with increasing dilatation of the left renal pelvis and left ureter. There is a tiny stone in the distal left ureter. Enhancement of the urothelium of the renal pelvis and ureter could be inflammatory or infectious. The bladder is now decompressed with a Foley catheter. 2. Probable renal cell carcinoma off the lower pole the right kidney unchanged since 2015. 1 or 2 other indeterminate lesions are  associated with the right kidney. These findings are similar since 2015. 3. The right rectus sheath hematoma and its sequela are decreasing. 4. Atherosclerosis. Electronically Signed   By: Dorise Bullion III M.D   On: 08/18/2016 15:33    Procedures Procedures (including critical care time)  Medications Ordered in ED Medications  LORazepam (ATIVAN) tablet 0.5 mg (not administered)  apixaban (ELIQUIS) tablet 5 mg (not administered)  RESOURCE THICKENUP CLEAR 1 Container (not administered)  DULoxetine (CYMBALTA) DR capsule 60 mg (not administered)  gabapentin (NEURONTIN) capsule 100 mg (not administered)  losartan (COZAAR) tablet 50 mg (not administered)  LORazepam (ATIVAN) tablet 0.25-0.5 mg (not administered)  memantine (NAMENDA XR) 24 hr capsule 28 mg (not administered)  risperiDONE (RISPERDAL) tablet 0.5 mg (not administered)  tamsulosin (FLOMAX) capsule 0.4 mg (not administered)  0.9 %  sodium chloride infusion ( Intravenous New Bag/Given 08/18/16 1927)  ondansetron (ZOFRAN) tablet 4 mg (not administered)    Or  ondansetron (ZOFRAN) injection 4 mg (not administered)  acetaminophen (TYLENOL) tablet 650 mg (not administered)    Or  acetaminophen (TYLENOL) suppository 650 mg (not administered)  cefTRIAXone (ROCEPHIN) 1 g in dextrose 5 % 50 mL IVPB (not administered)  sodium chloride 0.9 % bolus 1,000 mL (0 mLs Intravenous Stopped 08/18/16 1608)  acetaminophen (TYLENOL) tablet 1,000 mg (1,000 mg Oral Given 08/18/16 1329)  morphine 2 MG/ML injection 2 mg (2 mg Intravenous Given 08/18/16 1324)  ondansetron (ZOFRAN) injection 4 mg (4 mg Intravenous Given 08/18/16 1328)  iopamidol (ISOVUE-300) 61 % injection 100 mL (80 mLs Intravenous Contrast Given 08/18/16 1447)  cefTRIAXone (ROCEPHIN) 1 g in dextrose 5 % 50 mL IVPB (0 g Intravenous Stopped 08/18/16 1702)     Initial Impression / Assessment and Plan / ED Course  I have reviewed the triage vital signs and the nursing notes.  Pertinent labs  & imaging results that were available during my care of the patient were reviewed by me and considered in my medical decision making (see chart for details).  Clinical Course    83 y.o. M with AMS.  Likely delirium, urine with +nitrates and LE but few bacteria.  With delirium will treat.  Place in obs.   The patients results and plan were reviewed and discussed.   Any x-rays performed were independently reviewed by myself.   Differential diagnosis were considered with the presenting HPI.  Medications  LORazepam (ATIVAN) tablet 0.5 mg (not administered)  apixaban (ELIQUIS) tablet 5 mg (not administered)  RESOURCE THICKENUP CLEAR 1 Container (not administered)  DULoxetine (CYMBALTA) DR capsule 60 mg (not administered)  gabapentin (NEURONTIN) capsule 100 mg (not administered)  losartan (COZAAR) tablet 50 mg (not administered)  LORazepam (ATIVAN) tablet 0.25-0.5 mg (not administered)  memantine (NAMENDA XR) 24 hr capsule 28 mg (not administered)  risperiDONE (RISPERDAL) tablet 0.5 mg (not administered)  tamsulosin (FLOMAX) capsule 0.4 mg (not administered)  0.9 %  sodium chloride infusion ( Intravenous New Bag/Given 08/18/16 1927)  ondansetron (ZOFRAN) tablet 4 mg (not administered)    Or  ondansetron (ZOFRAN) injection 4 mg (not administered)  acetaminophen (TYLENOL) tablet 650 mg (not administered)    Or  acetaminophen (TYLENOL) suppository 650 mg (not administered)  cefTRIAXone (ROCEPHIN) 1 g in dextrose 5 % 50 mL IVPB (not administered)  sodium chloride 0.9 % bolus 1,000 mL (0 mLs Intravenous Stopped 08/18/16 1608)  acetaminophen (TYLENOL) tablet 1,000 mg (1,000 mg Oral Given 08/18/16 1329)  morphine 2 MG/ML injection 2 mg (2 mg Intravenous Given 08/18/16 1324)  ondansetron (ZOFRAN) injection 4 mg (4 mg Intravenous Given 08/18/16 1328)  iopamidol (ISOVUE-300) 61 % injection 100 mL (80 mLs Intravenous Contrast Given 08/18/16 1447)  cefTRIAXone (ROCEPHIN) 1 g in dextrose 5 % 50 mL IVPB (0  g Intravenous Stopped 08/18/16 1702)    Vitals:   08/18/16 1342 08/18/16 1615 08/18/16 1730 08/18/16 1800  BP: 189/80 107/60 113/63   Pulse: 61 63 67   Resp: 18 18 16    Temp:   97.6 F (36.4 C)   TempSrc:   Axillary   SpO2: 100% 100% 100%   Weight:    160 lb 0.9 oz (72.6 kg)  Height:    6' (1.829 m)    Final diagnoses:  Acute cystitis without hematuria    Admission/ observation were discussed with the admitting physician, patient and/or family and they are comfortable with the plan.     Final Clinical Impressions(s) / ED Diagnoses   Final diagnoses:  Acute cystitis without hematuria    New Prescriptions Current Discharge Medication List       Deno Etienne, DO 08/18/16 2021

## 2016-08-18 NOTE — ED Notes (Signed)
MD at bedside. 

## 2016-08-19 ENCOUNTER — Encounter (HOSPITAL_COMMUNITY): Payer: Self-pay | Admitting: Urology

## 2016-08-19 ENCOUNTER — Inpatient Hospital Stay (HOSPITAL_COMMUNITY): Payer: Medicare Other

## 2016-08-19 DIAGNOSIS — N201 Calculus of ureter: Secondary | ICD-10-CM

## 2016-08-19 DIAGNOSIS — F0280 Dementia in other diseases classified elsewhere without behavioral disturbance: Secondary | ICD-10-CM

## 2016-08-19 DIAGNOSIS — N134 Hydroureter: Secondary | ICD-10-CM

## 2016-08-19 LAB — COMPREHENSIVE METABOLIC PANEL
ALBUMIN: 3.1 g/dL — AB (ref 3.5–5.0)
ALT: 15 U/L — ABNORMAL LOW (ref 17–63)
ANION GAP: 10 (ref 5–15)
AST: 14 U/L — AB (ref 15–41)
Alkaline Phosphatase: 59 U/L (ref 38–126)
BUN: 23 mg/dL — AB (ref 6–20)
CHLORIDE: 109 mmol/L (ref 101–111)
CO2: 24 mmol/L (ref 22–32)
Calcium: 8.8 mg/dL — ABNORMAL LOW (ref 8.9–10.3)
Creatinine, Ser: 1.26 mg/dL — ABNORMAL HIGH (ref 0.61–1.24)
GFR calc Af Amer: 55 mL/min — ABNORMAL LOW (ref >60)
GFR calc non Af Amer: 47 mL/min — ABNORMAL LOW (ref >60)
GLUCOSE: 90 mg/dL (ref 65–99)
POTASSIUM: 3.8 mmol/L (ref 3.5–5.1)
SODIUM: 143 mmol/L (ref 135–145)
Total Bilirubin: 1.1 mg/dL (ref 0.3–1.2)
Total Protein: 5.6 g/dL — ABNORMAL LOW (ref 6.5–8.1)

## 2016-08-19 LAB — CBC
HEMATOCRIT: 32.8 % — AB (ref 39.0–52.0)
HEMOGLOBIN: 10.4 g/dL — AB (ref 13.0–17.0)
MCH: 28 pg (ref 26.0–34.0)
MCHC: 31.7 g/dL (ref 30.0–36.0)
MCV: 88.2 fL (ref 78.0–100.0)
Platelets: 278 10*3/uL (ref 150–400)
RBC: 3.72 MIL/uL — ABNORMAL LOW (ref 4.22–5.81)
RDW: 15 % (ref 11.5–15.5)
WBC: 8.1 10*3/uL (ref 4.0–10.5)

## 2016-08-19 LAB — HEPARIN LEVEL (UNFRACTIONATED)
HEPARIN UNFRACTIONATED: 1.07 [IU]/mL — AB (ref 0.30–0.70)
Heparin Unfractionated: 1.06 IU/mL — ABNORMAL HIGH (ref 0.30–0.70)

## 2016-08-19 LAB — APTT
APTT: 44 s — AB (ref 24–36)
aPTT: 37 seconds — ABNORMAL HIGH (ref 24–36)

## 2016-08-19 MED ORDER — HEPARIN (PORCINE) IN NACL 100-0.45 UNIT/ML-% IJ SOLN
950.0000 [IU]/h | INTRAMUSCULAR | Status: DC
  Administered 2016-08-20: 950 [IU]/h via INTRAVENOUS
  Filled 2016-08-19 (×3): qty 250

## 2016-08-19 MED ORDER — HALOPERIDOL LACTATE 5 MG/ML IJ SOLN
2.0000 mg | Freq: Once | INTRAMUSCULAR | Status: AC
  Administered 2016-08-19: 2 mg via INTRAVENOUS
  Filled 2016-08-19: qty 1

## 2016-08-19 MED ORDER — LORAZEPAM 2 MG/ML IJ SOLN
0.5000 mg | Freq: Three times a day (TID) | INTRAMUSCULAR | Status: DC | PRN
  Administered 2016-08-19 – 2016-08-23 (×9): 0.5 mg via INTRAVENOUS
  Filled 2016-08-19 (×11): qty 1

## 2016-08-19 MED ORDER — METOPROLOL TARTRATE 12.5 MG HALF TABLET: 12.5000 mg | ORAL_TABLET | Freq: Two times a day (BID) | ORAL | Status: DC

## 2016-08-19 MED ORDER — METOPROLOL TARTRATE 5 MG/5ML IV SOLN
2.5000 mg | Freq: Three times a day (TID) | INTRAVENOUS | Status: DC
  Administered 2016-08-19 – 2016-08-20 (×3): 2.5 mg via INTRAVENOUS
  Filled 2016-08-19 (×3): qty 5

## 2016-08-19 MED ORDER — HEPARIN (PORCINE) IN NACL 100-0.45 UNIT/ML-% IJ SOLN
900.0000 [IU]/h | INTRAMUSCULAR | Status: DC
  Administered 2016-08-19: 900 [IU]/h via INTRAVENOUS
  Filled 2016-08-19: qty 250

## 2016-08-19 NOTE — Consult Note (Signed)
Subjective: CC: Confusion.  Hx:  I was asked to see [name] in consultation by Dr. [name] for left hydronephrosis with a left distal stone.   [name] is followed by Dr. Junious Silk in our office and has a long urologic history.   He had an open prostatectomy in 1975 and had recurrent BPH and had been on tamsulosin and finasteride, butis now just on the tamsulosin.Marland Kitchen   He has a history of a left ureteral stone that was managed with left ureteroscopy and stenting in 5/16.  He was found to have urinary retention with a PVR of 360ml on admission on 07/31/16 and had a foley placed.   He was admitted then with a massive PE and is currently on Eliquis.   He was seen in the ER on 5/30 with blood at the meatus with a foley change and a urine culture then grew proteus.  He had a CT at that visit that showed mild fullness of the left renal pelvis but no ureterectasis.   He was readmitted on on 11/3 with progressive confusion and cloudy urine.  He has had some loose bowel movements and poor oral intake.  A repeat CT showed increased left hydronephrosis with dilation of the ureter to the bladder with a tiny stone in the left distal ureter.  There is some increased enhancement of the ureter suggestive of inflammation.  A foley was in the decompressed bladder.   He has been afebrile without a leukocytosis or tachycardia.   He has been placed on Ceftriaxone based on the culture.    ROS:  Review of Systems  Unable to perform ROS: Dementia    Allergies  Allergen Reactions  . Amoxicillin Diarrhea  . Codeine Other (See Comments)  . Feldene [Piroxicam] Other (See Comments)  . Ibuprofen Other (See Comments)    H/o bleeding (per pt's daughter)  . Indocin [Indomethacin] Other (See Comments)  . Morphine And Related Nausea And Vomiting  . Other     omnicor- unknown reaction  . Uroxatral [Alfuzosin Hcl Er] Other (See Comments)    Past Medical History:  Diagnosis Date  . Anxiety   . BPH (benign prostatic  hypertrophy)   . Constipation, chronic   . Dementia    situational  . Diverticular hemorrhage 2014  . Frequency of urination   . Hemorrhoids    "lift chair helped"  . Hypertension   . Kidney stone on left side   . PE (pulmonary thromboembolism) ([place])   . Pneumonia    hx of age 63  . Seizures ([place])   . Skin cancer   . Transient global amnesia 15-20 years ago    Past Surgical History:  Procedure Laterality Date  . BLADDER SURGERY  1980's  . COLONOSCOPY N/A 01/28/2013   Procedure: COLONOSCOPY;  Surgeon: Jerene Bears, MD;  Location: [facility];  Service: Gastroenterology;  Laterality: N/A;  . CYSTOSCOPY WITH RETROGRADE PYELOGRAM, URETEROSCOPY AND STENT PLACEMENT Left 03/09/2015   Procedure: CYSTOSCOPY/LEFT URETEROSCOPY/ RETROGRADE PYLEGRAM HOLMIUM LASER, STONE  BASKET/STENT PLACEMENT;  Surgeon: Festus Aloe, MD;  Location: [facility];  Service: Urology;  Laterality: Left;  . HEMORRHOID SURGERY  1980's  . SKIN CANCER EXCISION    . SUPRAPUBIC PROSTATECTOMY  1975    Social History   Social History  . Marital status: Married    Spouse name: N/A  . Number of children: N/A  . Years of education: N/A   Occupational History  . Not on file.   Social History  Main Topics  . Smoking status: Former Smoker    Packs/day: 2.00    Years: 10.00    Types: Cigarettes  . Smokeless tobacco: Never Used     Comment: quit years ago  . Alcohol use No  . Drug use: No  . Sexual activity: Not on file   Other Topics Concern  . Not on file   Social History Narrative  . No narrative on file    No family history on file.  Anti-infectives: Anti-infectives    Start     Dose/Rate Route Frequency Ordered Stop   08/19/16 1600  cefTRIAXone (ROCEPHIN) 1 g in dextrose 5 % 50 mL IVPB     1 g 100 mL/hr over 30 Minutes Intravenous Every 24 hours 08/18/16 1746     08/18/16 1545  cefTRIAXone (ROCEPHIN) 1 g in dextrose 5 % 50 mL IVPB     1 g 100 mL/hr over 30 Minutes Intravenous  Once 08/18/16 1538  08/18/16 1702      Current Facility-Administered Medications  Medication Dose Route Frequency Provider Last Rate Last Dose  . 0.9 %  sodium chloride infusion   Intravenous Continuous Oswald Hillock, MD 75 mL/hr at 08/19/16 1559    . acetaminophen (TYLENOL) tablet 650 mg  650 mg Oral Q6H PRN Oswald Hillock, MD       Or  . acetaminophen (TYLENOL) suppository 650 mg  650 mg Rectal Q6H PRN Oswald Hillock, MD      . cefTRIAXone (ROCEPHIN) 1 g in dextrose 5 % 50 mL IVPB  1 g Intravenous Q24H Minda Ditto, RPH   1 g at 08/19/16 1541  . DULoxetine (CYMBALTA) DR capsule 60 mg  60 mg Oral Q breakfast Oswald Hillock, MD      . gabapentin (NEURONTIN) capsule 100 mg  100 mg Oral TID Oswald Hillock, MD   100 mg at 08/18/16 2156  . heparin ADULT infusion 100 units/mL (25000 units/216mL sodium chloride 0.45%)  900 Units/hr Intravenous Continuous Anh P Pham, RPH 9 mL/hr at 08/19/16 1538 900 Units/hr at 08/19/16 1538  . LORazepam (ATIVAN) injection 0.5 mg  0.5 mg Intravenous TID PRN Ritta Slot, NP   0.5 mg at 08/19/16 0144  . LORazepam (ATIVAN) tablet 0.5 mg  0.5 mg Oral QHS Oswald Hillock, MD   0.5 mg at 08/18/16 2156  . memantine (NAMENDA XR) 24 hr capsule 28 mg  28 mg Oral Q1200 Oswald Hillock, MD      . metoprolol (LOPRESSOR) injection 2.5 mg  2.5 mg Intravenous Q8H [name], MD   2.5 mg at 08/19/16 1539  . ondansetron (ZOFRAN) tablet 4 mg  4 mg Oral Q6H PRN Oswald Hillock, MD       Or  . ondansetron (ZOFRAN) injection 4 mg  4 mg Intravenous Q6H PRN Oswald Hillock, MD      . RESOURCE THICKENUP CLEAR 1 Container  1 Container Oral PRN Oswald Hillock, MD      . risperiDONE (RISPERDAL) tablet 0.5 mg  0.5 mg Oral q morning - 10a Oswald Hillock, MD      . tamsulosin (FLOMAX) capsule 0.4 mg  0.4 mg Oral QPC breakfast Oswald Hillock, MD       Past medical, surgical and social history reviewed and updated.   Objective: Vital signs in last 24 hours: Temp:  [97.6 F (36.4 C)-98.5 F (36.9 C)] 98.5 F (36.9 C) (11/04 1421) Pulse  Rate:  [  63-74] 72 (11/04 1421) Resp:  [16-20] 20 (11/04 1421) BP: (113-166)/(57-86) 158/86 (11/04 1421) SpO2:  [94 %-100 %] 94 % (11/04 1421) Weight:  [72.6 kg (160 lb 0.9 oz)] 72.6 kg (160 lb 0.9 oz) (11/03 1800)  Intake/Output from previous day: 11/03 0701 - 11/04 0700 In: 1616.3 [I.V.:566.3; IV Piggyback:1050] Out: 400 [Urine:400] Intake/Output this shift: No intake/output data recorded.   Physical Exam  Constitutional: He is well-developed, well-nourished, and in no distress.  HENT:  Head: Normocephalic and atraumatic.  Poor dentition  Neck: Normal range of motion. Neck supple. No thyromegaly present.  Cardiovascular: Normal rate, regular rhythm and normal heart sounds.   Pulmonary/Chest: Effort normal and breath sounds normal. No respiratory distress.  Abdominal: Soft. Bowel sounds are normal. He exhibits no distension and no mass. There is no tenderness. There is no guarding.  Genitourinary:  Genitourinary Comments: Normal penis with foley at the meatus.  Scrotum and testes are normal.  Rectal deferred.   Musculoskeletal: Normal range of motion. He exhibits no edema or tenderness.  Lymphadenopathy:    He has no cervical adenopathy.    He has no axillary adenopathy.       Right: No inguinal and no supraclavicular adenopathy present.       Left: No inguinal and no supraclavicular adenopathy present.  Neurological: He is alert.  Oriented to name only.  Skin: Skin is warm and dry.  Psychiatric:  Slightly agitated.     Lab Results:   Recent Labs  08/18/16 1309 08/19/16 0519  WBC 8.4 8.1  HGB 10.2* 10.4*  HCT 32.0* 32.8*  PLT 311 278   BMET  Recent Labs  08/18/16 1309 08/19/16 0519  NA 140 143  K 4.0 3.8  CL 107 109  CO2 28 24  GLUCOSE 99 90  BUN 29* 23*  CREATININE 1.44* 1.26*  CALCIUM 9.0 8.8*   PT/INR No results for input(s): LABPROT, INR in the last 72 hours. ABG  Recent Labs  08/18/16 1530  HCO3 26.0    Studies/Results: Dg Chest 2  View  Result Date: 08/18/2016 CLINICAL DATA:  Altered mental status. History of dementia, hypertension and pneumonia. EXAM: CHEST  2 VIEW COMPARISON:  CT chest August 03, 2016 FINDINGS: The cardiac silhouette is moderately enlarged. Calcified aortic knob. Small to moderate layering pleural effusion seen on the lateral radiograph. Mild bronchitic changes. Granuloma projects in LEFT upper lobe. No pneumothorax. Osteopenia. Moderate degenerative change of the spine. IMPRESSION: Small to moderate layering pleural effusion. Mild bronchitic changes. Stable cardiomegaly. Electronically Signed   By: Elon Alas M.D.   On: 08/18/2016 15:20   Ct Abdomen Pelvis W Contrast  Result Date: 08/18/2016 CLINICAL DATA:  Cloudy dark urine since Monday. EXAM: CT ABDOMEN AND PELVIS WITH CONTRAST TECHNIQUE: Multidetector CT imaging of the abdomen and pelvis was performed using the standard protocol following bolus administration of intravenous contrast. CONTRAST:  22mL ISOVUE-300 IOPAMIDOL (ISOVUE-300) INJECTION 61% COMPARISON:  August 07, 2016 FINDINGS: Lower chest: Small bilateral pleural effusions identified. Coronary artery calcifications. No other lung base abnormalities. Hepatobiliary: There is a probable tiny cyst in the lower right hepatic lobe on series 2, image 33, too small to characterize. No suspicious hepatic masses. Limited views of the portal vein are normal. The gallbladder is unremarkable. Pancreas: Unremarkable. No pancreatic ductal dilatation or surrounding inflammatory changes. Spleen: Splenic calcified granulomas.  No other abnormalities. Adrenals/Urinary Tract: The adrenal glands are normal. There is a small neoplasm off the posterior lower pole of the right kidney on  series 2, image 41, not significantly changed since 2015. There is an indeterminate exophytic lesion off the mid right kidney posteriorly on series 2, image 34. No suspicious solid masses are seen on the left. No hydronephrosis on the  right. No right ureteral stones. There is mild hydronephrosis on the left and increasing prominence of the left renal pelvis. There is increased attenuation in the fat around the left renal pelvis and enhancement of the urothelium of the left renal pelvis and ureter. The ureter is newly dilated and there is a tiny punctate stone measuring 2 mm in the distal left ureter on series 2, image 77. The bladder is decompressed with a Foley catheter. There is a cyst off the upper pole left kidney. Stomach/Bowel: The stomach and small bowel are normal. The stool ball seen on the previous study has resolved. Fecal loading remains throughout the colon, decreased in the interval. A few scattered colonic diverticuli are seen without diverticulitis. The appendix is normal in appearance. Vascular/Lymphatic: Atherosclerotic change seen throughout the aorta and branching vessels. No aneurysm or dissection. No adenopathy. Reproductive: Prostate is unremarkable. Other: The right rectus sheath hematoma persists but is smaller in the interval. Stranding in blood products are seen in the right pelvis on series 2, image 72, also decreased. No free air or free fluid. Musculoskeletal: No bony changes. IMPRESSION: 1. Mild hydronephrosis associated with the left kidney with increasing dilatation of the left renal pelvis and left ureter. There is a tiny stone in the distal left ureter. Enhancement of the urothelium of the renal pelvis and ureter could be inflammatory or infectious. The bladder is now decompressed with a Foley catheter. 2. Probable renal cell carcinoma off the lower pole the right kidney unchanged since 2015. 1 or 2 other indeterminate lesions are associated with the right kidney. These findings are similar since 2015. 3. The right rectus sheath hematoma and its sequela are decreasing. 4. Atherosclerosis. Electronically Signed   By: Dorise Bullion III M.D   On: 08/18/2016 15:33   I discussed his case with Dr. Erlinda Hong and have  reviewed office notes from Dr. Junious Silk, the hospital records from his recent visits, labs, cultures and multiple CT films and reports.  I discussed the findings with his daughter along with my recommendations.   Assessment: 1. Mild left hydronephrosis with a tiny left distal stone that may or may not be the source of the obstruction.  He has no flank pain that I can elicit.  2. Proteus UTI with some possible ureteral inflammation but no fever, tachycardia or leukocytosis. 3. Recent retention with a foley since 07/31/16 with long history of BPH with BOO.Marland Kitchen  He has some catheter discomfort.  4. Recent PE. 5. Progressive dementia.  Rec: 1. With the obstruction and tiny stone but signs of sepsis other than mild ARI which is improving and mental status changes I am reluctant to put this elderly infirmed gentleman through an anesthetic to place a stent. 2. I am going to order a renal US for tomorrow and follow his clinical progress.   If the hydronephrosis persists or his condition worsens, I will consider cystoscopy and left ureteral stenting, but will hold off on that for the time being. 3. His daughter expressed concern about the discomfort with the foley and I will consider a voiding trial but I would prefer to wait until after the [place]. 4. I have discussed the risks of conservative therapy in the face of ureteral obstruction and a UTI, but she is  in agreement with a more conservative approach to management at this time.     CC: Dr. [name] and Dr. Eda Keys.      Desean Heemstra,Saleh J 08/19/2016 [phone]

## 2016-08-19 NOTE — Progress Notes (Signed)
ANTICOAGULATION CONSULT NOTE - Initial Consult  Pharmacy Consult for heparin Indication: hx recent pulmonary embolus  Allergies  Allergen Reactions  . Amoxicillin Diarrhea  . Codeine Other (See Comments)  . Feldene [Piroxicam] Other (See Comments)  . Ibuprofen Other (See Comments)    H/o bleeding (per pt's daughter)  . Indocin [Indomethacin] Other (See Comments)  . Morphine And Related Nausea And Vomiting  . Other     omnicor- unknown reaction  . Uroxatral [Alfuzosin Hcl Er] Other (See Comments)    Patient Measurements: Height: 6' (182.9 cm) Weight: 160 lb 0.9 oz (72.6 kg) IBW/kg (Calculated) : 77.6 Heparin Dosing Weight: 72 kg  Vital Signs: Temp: 98.5 F (36.9 C) (11/04 1421) Temp Source: Oral (11/04 1421) BP: 158/86 (11/04 1421) Pulse Rate: 72 (11/04 1421)  Labs:  Recent Labs  08/18/16 1309 08/19/16 0519  HGB 10.2* 10.4*  HCT 32.0* 32.8*  PLT 311 278  CREATININE 1.44* 1.26*    Estimated Creatinine Clearance: 37.6 mL/min (by C-G formula based on SCr of 1.26 mg/dL (H)).   Medical History: Past Medical History:  Diagnosis Date  . Anxiety   . BPH (benign prostatic hypertrophy)   . Constipation, chronic   . Dementia    situational  . Diverticular hemorrhage 2014  . Frequency of urination   . Hemorrhoids    "lift chair helped"  . Hypertension   . Kidney stone on left side   . PE (pulmonary thromboembolism) ([place])   . Pneumonia    hx of age 85  . Seizures ([place])   . Skin cancer   . Transient global amnesia 15-20 years ago    Medications:  Home Eliquis 5 mg twice daily  Assessment: Patient is a 82 y.o M with metabolic encephalopathy and recent large bilateral PE (with right heart strain) on 08/03/16 on eliquis 5 mg bid PTA, presented to the ED on 11/03 with c/o dark cloudy urine.  Patient is not alert enough at this time to take oral meds.  To change Eliquis to heparin for PE until patient is able to  Oral medications.  Last Eliquis dose taken at 10PM  on 11/03.   Goal of Therapy:  Heparin level 0.3-0.7 units/ml Monitor platelets by anticoagulation protocol: Yes   Plan:  - d/c eliquis - start heparin drip at 900 units/hr (no bolus) - check baseline aPTT and heparin level now, then 8 hours after starting heparin drip  Christle Nolting P 08/19/2016,2:32 PM

## 2016-08-19 NOTE — Progress Notes (Addendum)
PROGRESS NOTE  [name] [id number] DOB: [birth year] DOA: 08/18/2016 PCP: Melinda Crutch, MD  HPI/Recap of past 24 hours:  Confused, not able to provided history, not following command Daughter states this is not his baseline  Assessment/Plan: Active Problems:   Encephalopathy acute   UTI (urinary tract infection)   Pressure injury of skin   uti with metabolic encephalopathy, family report cloudy dark urine since Monday 10/30 no fever, no leukocytosis, bun/cr 23/1.26 close to baseline, blood culture no growth so far Ct pel on 11/3 shoed mild hydrohephrosis with left kidney, with increasing dilatation of the left renal pelvis and left ureter.Enhancement of the urothelium of the renal pelvis and ureter could be inflammatory or infectious. There is a tiny stone in the distal left ureter.  Urine culture from 10/30 + [place] sensitive to rocephin, will continue rocephin, repeat urine culture obtained from 11/3+ [facility] consult   ckd III, d/c cozaar, renal dosing meds  HTN; d/c cozaar due to ckd , will try betablocker  Recent PE, continue apixaban, on room air  right rectus sheath hematoma ( frome recent hospitalization) and its sequela are decreasing.  Dementia: from home  Patient is not awake enough to take oral meds, will change apixaban to heparin drip for now  Code Status: DNR  Family Communication: patient , personal care giver in room and daughter over the phone  Disposition Plan: home with 24/7 care in a few days   Consultants:  urology  Procedures:  none  Antibiotics:  rocephin   Objective: BP (!) 166/80 (BP Location: Left Arm)   Pulse 74   Temp 98.5 F (36.9 C) (Oral)   Resp 18   Ht 6' (1.829 m)   Wt 72.6 kg (160 lb 0.9 oz)   SpO2 96%   BMI 21.71 kg/m   Intake/Output Summary (Last 24 hours) at 08/19/16 0836 Last data filed at 08/19/16 0300  Gross per 24 hour  Intake          1616.25 ml  Output              400 ml  Net           1216.25 ml   Filed Weights   08/18/16 1800  Weight: 72.6 kg (160 lb 0.9 oz)    Exam:   General:  Demented, foely catheter in place  Cardiovascular: RRR  Respiratory: CTABL  Abdomen: Soft/ND/NT, positive BS  Musculoskeletal: No Edema  Neuro: demented  Data Reviewed: Basic Metabolic Panel:  Recent Labs Lab 08/18/16 1309 08/19/16 0519  NA 140 143  K 4.0 3.8  CL 107 109  CO2 28 24  GLUCOSE 99 90  BUN 29* 23*  CREATININE 1.44* 1.26*  CALCIUM 9.0 8.8*   Liver Function Tests:  Recent Labs Lab 08/18/16 1309 08/19/16 0519  AST 17 14*  ALT 17 15*  ALKPHOS 62 59  BILITOT 1.2 1.1  PROT 6.3* 5.6*  ALBUMIN 3.3* 3.1*    Recent Labs Lab 08/18/16 1309  LIPASE 28    Recent Labs Lab 08/18/16 1531  AMMONIA 15   CBC:  Recent Labs Lab 08/18/16 1309 08/19/16 0519  WBC 8.4 8.1  NEUTROABS 6.0  --   HGB 10.2* 10.4*  HCT 32.0* 32.8*  MCV 87.2 88.2  PLT 311 278   Cardiac Enzymes:   No results for input(s): CKTOTAL, CKMB, CKMBINDEX, TROPONINI in the last 168 hours. BNP (last 3 results)  Recent Labs  07/31/16 2107  BNP 199.1*  ProBNP (last 3 results) No results for input(s): PROBNP in the last 8760 hours.  CBG: No results for input(s): GLUCAP in the last 168 hours.  Recent Results (from the past 240 hour(s))  Urine culture     Status: Abnormal   Collection Time: 08/14/16  6:05 PM  Result Value Ref Range Status   Specimen Description URINE, CATHETERIZED  Final   Special Requests NONE  Final   Culture >=100,000 COLONIES/mL PROTEUS MIRABILIS (A)  Final   Report Status 08/17/2016 FINAL  Final   Organism ID, Bacteria PROTEUS MIRABILIS (A)  Final      Susceptibility   Proteus mirabilis - MIC*    AMPICILLIN <=2 SENSITIVE Sensitive     CEFAZOLIN <=4 SENSITIVE Sensitive     CEFTRIAXONE <=1 SENSITIVE Sensitive     CIPROFLOXACIN <=0.25 SENSITIVE Sensitive     GENTAMICIN <=1 SENSITIVE Sensitive     IMIPENEM 2 SENSITIVE Sensitive     NITROFURANTOIN  128 RESISTANT Resistant     TRIMETH/SULFA <=20 SENSITIVE Sensitive     AMPICILLIN/SULBACTAM <=2 SENSITIVE Sensitive     PIP/TAZO <=4 SENSITIVE Sensitive     * >=100,000 COLONIES/mL PROTEUS MIRABILIS     Studies: Dg Chest 2 View  Result Date: 08/18/2016 CLINICAL DATA:  Altered mental status. History of dementia, hypertension and pneumonia. EXAM: CHEST  2 VIEW COMPARISON:  CT chest August 03, 2016 FINDINGS: The cardiac silhouette is moderately enlarged. Calcified aortic knob. Small to moderate layering pleural effusion seen on the lateral radiograph. Mild bronchitic changes. Granuloma projects in LEFT upper lobe. No pneumothorax. Osteopenia. Moderate degenerative change of the spine. IMPRESSION: Small to moderate layering pleural effusion. Mild bronchitic changes. Stable cardiomegaly. Electronically Signed   By: Elon Alas M.D.   On: 08/18/2016 15:20   Ct Abdomen Pelvis W Contrast  Result Date: 08/18/2016 CLINICAL DATA:  Cloudy dark urine since Monday. EXAM: CT ABDOMEN AND PELVIS WITH CONTRAST TECHNIQUE: Multidetector CT imaging of the abdomen and pelvis was performed using the standard protocol following bolus administration of intravenous contrast. CONTRAST:  50mL ISOVUE-300 IOPAMIDOL (ISOVUE-300) INJECTION 61% COMPARISON:  August 07, 2016 FINDINGS: Lower chest: Small bilateral pleural effusions identified. Coronary artery calcifications. No other lung base abnormalities. Hepatobiliary: There is a probable tiny cyst in the lower right hepatic lobe on series 2, image 33, too small to characterize. No suspicious hepatic masses. Limited views of the portal vein are normal. The gallbladder is unremarkable. Pancreas: Unremarkable. No pancreatic ductal dilatation or surrounding inflammatory changes. Spleen: Splenic calcified granulomas.  No other abnormalities. Adrenals/Urinary Tract: The adrenal glands are normal. There is a small neoplasm off the posterior lower pole of the right kidney on series  2, image 41, not significantly changed since 2015. There is an indeterminate exophytic lesion off the mid right kidney posteriorly on series 2, image 34. No suspicious solid masses are seen on the left. No hydronephrosis on the right. No right ureteral stones. There is mild hydronephrosis on the left and increasing prominence of the left renal pelvis. There is increased attenuation in the fat around the left renal pelvis and enhancement of the urothelium of the left renal pelvis and ureter. The ureter is newly dilated and there is a tiny punctate stone measuring 2 mm in the distal left ureter on series 2, image 77. The bladder is decompressed with a Foley catheter. There is a cyst off the upper pole left kidney. Stomach/Bowel: The stomach and small bowel are normal. The stool ball seen on the previous study  has resolved. Fecal loading remains throughout the colon, decreased in the interval. A few scattered colonic diverticuli are seen without diverticulitis. The appendix is normal in appearance. Vascular/Lymphatic: Atherosclerotic change seen throughout the aorta and branching vessels. No aneurysm or dissection. No adenopathy. Reproductive: Prostate is unremarkable. Other: The right rectus sheath hematoma persists but is smaller in the interval. Stranding in blood products are seen in the right pelvis on series 2, image 72, also decreased. No free air or free fluid. Musculoskeletal: No bony changes. IMPRESSION: 1. Mild hydronephrosis associated with the left kidney with increasing dilatation of the left renal pelvis and left ureter. There is a tiny stone in the distal left ureter. Enhancement of the urothelium of the renal pelvis and ureter could be inflammatory or infectious. The bladder is now decompressed with a Foley catheter. 2. Probable renal cell carcinoma off the lower pole the right kidney unchanged since 2015. 1 or 2 other indeterminate lesions are associated with the right kidney. These findings are  similar since 2015. 3. The right rectus sheath hematoma and its sequela are decreasing. 4. Atherosclerosis. Electronically Signed   By: Dorise Bullion III M.D   On: 08/18/2016 15:33    Scheduled Meds: . apixaban  5 mg Oral BID  . cefTRIAXone (ROCEPHIN)  IV  1 g Intravenous Q24H  . DULoxetine  60 mg Oral Q breakfast  . gabapentin  100 mg Oral TID  . LORazepam  0.5 mg Oral QHS  . losartan  50 mg Oral Q breakfast  . memantine  28 mg Oral Q1200  . risperiDONE  0.5 mg Oral q morning - 10a  . tamsulosin  0.4 mg Oral QPC breakfast    Continuous Infusions: . sodium chloride 75 mL/hr at 08/18/16 1927     Time spent: 30mins  Oakley Orban MD, PhD  [facility] Pager [phone]. If 7PM-7AM, please contact night-coverage at [email or web address], password [facility] 08/19/2016, 8:36 AM  LOS: 1 day

## 2016-08-20 ENCOUNTER — Inpatient Hospital Stay (HOSPITAL_COMMUNITY): Payer: Medicare Other

## 2016-08-20 DIAGNOSIS — I2699 Other pulmonary embolism without acute cor pulmonale: Secondary | ICD-10-CM

## 2016-08-20 DIAGNOSIS — F0281 Dementia in other diseases classified elsewhere with behavioral disturbance: Secondary | ICD-10-CM

## 2016-08-20 DIAGNOSIS — N200 Calculus of kidney: Secondary | ICD-10-CM

## 2016-08-20 LAB — BASIC METABOLIC PANEL
Anion gap: 11 (ref 5–15)
BUN: 19 mg/dL (ref 6–20)
CHLORIDE: 106 mmol/L (ref 101–111)
CO2: 24 mmol/L (ref 22–32)
CREATININE: 1.22 mg/dL (ref 0.61–1.24)
Calcium: 8.9 mg/dL (ref 8.9–10.3)
GFR calc non Af Amer: 49 mL/min — ABNORMAL LOW (ref >60)
GFR, EST AFRICAN AMERICAN: 57 mL/min — AB (ref >60)
Glucose, Bld: 95 mg/dL (ref 65–99)
Potassium: 3.9 mmol/L (ref 3.5–5.1)
Sodium: 141 mmol/L (ref 135–145)

## 2016-08-20 LAB — URINE CULTURE: Culture: >=100000 — AB

## 2016-08-20 LAB — CBC
HEMATOCRIT: 34.7 % — AB (ref 39.0–52.0)
HEMOGLOBIN: 10.9 g/dL — AB (ref 13.0–17.0)
MCH: 27.3 pg (ref 26.0–34.0)
MCHC: 31.4 g/dL (ref 30.0–36.0)
MCV: 87 fL (ref 78.0–100.0)
Platelets: 287 10*3/uL (ref 150–400)
RBC: 3.99 MIL/uL — ABNORMAL LOW (ref 4.22–5.81)
RDW: 15 % (ref 11.5–15.5)
WBC: 8.8 10*3/uL (ref 4.0–10.5)

## 2016-08-20 LAB — APTT
APTT: 44 s — AB (ref 24–36)
aPTT: 106 seconds — ABNORMAL HIGH (ref 24–36)

## 2016-08-20 LAB — HEPARIN LEVEL (UNFRACTIONATED)
HEPARIN UNFRACTIONATED: 0.9 [IU]/mL — AB (ref 0.30–0.70)
Heparin Unfractionated: 0.8 IU/mL — ABNORMAL HIGH (ref 0.30–0.70)

## 2016-08-20 LAB — MAGNESIUM: Magnesium: 1.6 mg/dL — ABNORMAL LOW (ref 1.7–2.4)

## 2016-08-20 LAB — LACTIC ACID, PLASMA: LACTIC ACID, VENOUS: 1.4 mmol/L (ref 0.5–1.9)

## 2016-08-20 LAB — PSA: PSA: 25.75 ng/mL — ABNORMAL HIGH (ref 0.00–4.00)

## 2016-08-20 MED ORDER — HALOPERIDOL LACTATE 5 MG/ML IJ SOLN: 0.5000 mg | Freq: Every day | INTRAMUSCULAR | Status: DC

## 2016-08-20 MED ORDER — LORAZEPAM 2 MG/ML IJ SOLN: 0.5000 mg | Freq: Every day | INTRAMUSCULAR | Status: DC

## 2016-08-20 MED ORDER — LORAZEPAM 2 MG/ML IJ SOLN
0.5000 mg | Freq: Every evening | INTRAMUSCULAR | Status: AC | PRN
  Administered 2016-08-20 – 2016-08-21 (×2): 0.5 mg via INTRAVENOUS
  Filled 2016-08-20: qty 1

## 2016-08-20 MED ORDER — HALOPERIDOL LACTATE 5 MG/ML IJ SOLN
1.0000 mg | Freq: Once | INTRAMUSCULAR | Status: AC
  Administered 2016-08-20: 1 mg via INTRAVENOUS
  Filled 2016-08-20: qty 1

## 2016-08-20 MED ORDER — HALOPERIDOL LACTATE 5 MG/ML IJ SOLN: 0.5000 mg | Freq: Every evening | INTRAMUSCULAR | Status: AC | PRN

## 2016-08-20 MED ORDER — MAGNESIUM SULFATE 2 GM/50ML IV SOLN
2.0000 g | Freq: Once | INTRAVENOUS | Status: AC
  Administered 2016-08-20: 2 g via INTRAVENOUS
  Filled 2016-08-20: qty 50

## 2016-08-20 MED ORDER — METOPROLOL TARTRATE 5 MG/5ML IV SOLN
2.5000 mg | Freq: Four times a day (QID) | INTRAVENOUS | Status: DC
  Administered 2016-08-20 – 2016-08-22 (×8): 2.5 mg via INTRAVENOUS
  Filled 2016-08-20 (×8): qty 5

## 2016-08-20 NOTE — Progress Notes (Signed)
Pt restless, agitated, pulling at lines and attempting to get OOB. Private sitter at bedside but patient continues behaviors. Pt has dislodged IV and site is leaking so d/ced IV. Attempted to obtain another site but unsuccessful. MD on call contacted for additional prn. Will have another nurse on floor attempt IV and continue to monitor.

## 2016-08-20 NOTE — Progress Notes (Signed)
Writer text MD to alert of discoloration spreading to pt's left hand and edema worsening in last hour. Will await return call.

## 2016-08-20 NOTE — Progress Notes (Signed)
Pt voided after Foley discontinued Bladder scan at this time showed 63.

## 2016-08-20 NOTE — Progress Notes (Signed)
Writer noted left hand bruising and edema this morning. Possibly from IV stick (pt pulled out 3 IV's last night), or lab stick. Pt wearing "mitts" due to noncompliance and pulling tubes.  At this time, the pt is c/o of left hand pain and the swelling and discoloration is now moving down his fingers of left hand. Writer has spoken with MD and pt to receive xray. Radial pulse palpable to left wrist and capillary refill to all  nails noted. Will continue to monitor closely. Await xray results and MD assessment.

## 2016-08-20 NOTE — Consult Note (Signed)
Reason for Consult:hand swelling Referring Physician: Hospitalist  CC:pt non verbal  HPI:  [name] is an 82 y.o. male who has developed a hematoma of the left hand.  Pt has dementia and has a PE and was being treated with IV heparin; pt pulled out IV and nurse noted a moderate hematoma in the right hand; this evening it was noted that hand was even more swollen  .   Heparin has been stopped.  Associated signs/symptoms: anticoagulation for PE Previous treatment:  n/a  Past Medical History:  Diagnosis Date  . Anxiety   . BPH (benign prostatic hypertrophy)   . Constipation, chronic   . Dementia    situational  . Diverticular hemorrhage 2014  . Frequency of urination   . Hemorrhoids    "lift chair helped"  . Hypertension   . Kidney stone on left side   . PE (pulmonary thromboembolism) ([place])   . Pneumonia    hx of age 51  . Seizures ([place])   . Skin cancer   . Transient global amnesia 15-20 years ago    Past Surgical History:  Procedure Laterality Date  . BLADDER SURGERY  1980's  . COLONOSCOPY N/A 01/28/2013   Procedure: COLONOSCOPY;  Surgeon: Jerene Bears, MD;  Location: [facility];  Service: Gastroenterology;  Laterality: N/A;  . CYSTOSCOPY WITH RETROGRADE PYELOGRAM, URETEROSCOPY AND STENT PLACEMENT Left 03/09/2015   Procedure: CYSTOSCOPY/LEFT URETEROSCOPY/ RETROGRADE PYLEGRAM HOLMIUM LASER, STONE  BASKET/STENT PLACEMENT;  Surgeon: Festus Aloe, MD;  Location: [facility];  Service: Urology;  Laterality: Left;  . HEMORRHOID SURGERY  1980's  . SKIN CANCER EXCISION    . SUPRAPUBIC PROSTATECTOMY  1975    No family history on file.  Social History:  reports that he has quit smoking. His smoking use included Cigarettes. He has a 20.00 pack-year smoking history. He has never used smokeless tobacco. He reports that he does not drink alcohol or use drugs.  Allergies:  Allergies  Allergen Reactions  . Amoxicillin Diarrhea  . Codeine Other (See Comments)  . Feldene [Piroxicam]  Other (See Comments)  . Ibuprofen Other (See Comments)    H/o bleeding (per pt's daughter)  . Indocin [Indomethacin] Other (See Comments)  . Morphine And Related Nausea And Vomiting  . Other     omnicor- unknown reaction  . Uroxatral [Alfuzosin Hcl Er] Other (See Comments)    Medications: I have reviewed the patient's current medications.  Results for orders placed or performed during the hospital encounter of 08/18/16 (from the past 48 hour(s))  CBC     Status: Abnormal   Collection Time: 08/19/16  5:19 AM  Result Value Ref Range   WBC 8.1 4.0 - 10.5 K/uL   RBC 3.72 (L) 4.22 - 5.81 MIL/uL   Hemoglobin 10.4 (L) 13.0 - 17.0 g/dL   HCT 32.8 (L) 39.0 - 52.0 %   MCV 88.2 78.0 - 100.0 fL   MCH 28.0 26.0 - 34.0 pg   MCHC 31.7 30.0 - 36.0 g/dL   RDW 15.0 11.5 - 15.5 %   Platelets 278 150 - 400 K/uL  Comprehensive metabolic panel     Status: Abnormal   Collection Time: 08/19/16  5:19 AM  Result Value Ref Range   Sodium 143 135 - 145 mmol/L   Potassium 3.8 3.5 - 5.1 mmol/L   Chloride 109 101 - 111 mmol/L   CO2 24 22 - 32 mmol/L   Glucose, Bld 90 65 - 99 mg/dL   BUN 23 (H)  6 - 20 mg/dL   Creatinine, Ser 1.26 (H) 0.61 - 1.24 mg/dL   Calcium 8.8 (L) 8.9 - 10.3 mg/dL   Total Protein 5.6 (L) 6.5 - 8.1 g/dL   Albumin 3.1 (L) 3.5 - 5.0 g/dL   AST 14 (L) 15 - 41 U/L   ALT 15 (L) 17 - 63 U/L   Alkaline Phosphatase 59 38 - 126 U/L   Total Bilirubin 1.1 0.3 - 1.2 mg/dL   GFR calc non Af Amer 47 (L) >60 mL/min   GFR calc Af Amer 55 (L) >60 mL/min    Comment: (NOTE) The eGFR has been calculated using the CKD EPI equation. This calculation has not been validated in all clinical situations. eGFR's persistently <60 mL/min signify possible Chronic Kidney Disease.    Anion gap 10 5 - 15  Heparin level (unfractionated)     Status: Abnormal   Collection Time: 08/19/16  3:40 PM  Result Value Ref Range   Heparin Unfractionated 1.06 (H) 0.30 - 0.70 IU/mL    Comment: RESULTS CONFIRMED BY MANUAL  DILUTION        IF HEPARIN RESULTS ARE BELOW EXPECTED VALUES, AND PATIENT DOSAGE HAS BEEN CONFIRMED, SUGGEST FOLLOW UP TESTING OF ANTITHROMBIN III LEVELS.   APTT     Status: Abnormal   Collection Time: 08/19/16  3:40 PM  Result Value Ref Range   aPTT 37 (H) 24 - 36 seconds    Comment:        IF BASELINE aPTT IS ELEVATED, SUGGEST PATIENT RISK ASSESSMENT BE USED TO DETERMINE APPROPRIATE ANTICOAGULANT THERAPY.   Heparin level (unfractionated)     Status: Abnormal   Collection Time: 08/19/16 10:36 PM  Result Value Ref Range   Heparin Unfractionated 1.07 (H) 0.30 - 0.70 IU/mL    Comment:        IF HEPARIN RESULTS ARE BELOW EXPECTED VALUES, AND PATIENT DOSAGE HAS BEEN CONFIRMED, SUGGEST FOLLOW UP TESTING OF ANTITHROMBIN III LEVELS.   APTT     Status: Abnormal   Collection Time: 08/19/16 10:36 PM  Result Value Ref Range   aPTT 44 (H) 24 - 36 seconds    Comment:        IF BASELINE aPTT IS ELEVATED, SUGGEST PATIENT RISK ASSESSMENT BE USED TO DETERMINE APPROPRIATE ANTICOAGULANT THERAPY.   Heparin level (unfractionated)     Status: Abnormal   Collection Time: 08/20/16  6:03 AM  Result Value Ref Range   Heparin Unfractionated 0.80 (H) 0.30 - 0.70 IU/mL    Comment:        IF HEPARIN RESULTS ARE BELOW EXPECTED VALUES, AND PATIENT DOSAGE HAS BEEN CONFIRMED, SUGGEST FOLLOW UP TESTING OF ANTITHROMBIN III LEVELS.   APTT     Status: Abnormal   Collection Time: 08/20/16  6:03 AM  Result Value Ref Range   aPTT 44 (H) 24 - 36 seconds    Comment:        IF BASELINE aPTT IS ELEVATED, SUGGEST PATIENT RISK ASSESSMENT BE USED TO DETERMINE APPROPRIATE ANTICOAGULANT THERAPY.   CBC     Status: Abnormal   Collection Time: 08/20/16  6:10 AM  Result Value Ref Range   WBC 8.8 4.0 - 10.5 K/uL   RBC 3.99 (L) 4.22 - 5.81 MIL/uL   Hemoglobin 10.9 (L) 13.0 - 17.0 g/dL   HCT 34.7 (L) 39.0 - 52.0 %   MCV 87.0 78.0 - 100.0 fL   MCH 27.3 26.0 - 34.0 pg   MCHC 31.4 30.0 - 36.0 g/dL  RDW  15.0 11.5 - 15.5 %   Platelets 287 150 - 400 K/uL  Basic metabolic panel     Status: Abnormal   Collection Time: 08/20/16  6:10 AM  Result Value Ref Range   Sodium 141 135 - 145 mmol/L   Potassium 3.9 3.5 - 5.1 mmol/L   Chloride 106 101 - 111 mmol/L   CO2 24 22 - 32 mmol/L   Glucose, Bld 95 65 - 99 mg/dL   BUN 19 6 - 20 mg/dL   Creatinine, Ser 1.22 0.61 - 1.24 mg/dL   Calcium 8.9 8.9 - 10.3 mg/dL   GFR calc non Af Amer 49 (L) >60 mL/min   GFR calc Af Amer 57 (L) >60 mL/min    Comment: (NOTE) The eGFR has been calculated using the CKD EPI equation. This calculation has not been validated in all clinical situations. eGFR's persistently <60 mL/min signify possible Chronic Kidney Disease.    Anion gap 11 5 - 15  Magnesium     Status: Abnormal   Collection Time: 08/20/16  6:10 AM  Result Value Ref Range   Magnesium 1.6 (L) 1.7 - 2.4 mg/dL  Lactic acid, plasma     Status: None   Collection Time: 08/20/16  6:10 AM  Result Value Ref Range   Lactic Acid, Venous 1.4 0.5 - 1.9 mmol/L  PSA     Status: Abnormal   Collection Time: 08/20/16  6:10 AM  Result Value Ref Range   PSA 25.75 (H) 0.00 - 4.00 ng/mL    Comment: (NOTE) While PSA levels of <=4.0 ng/ml are reported as reference range, some men with levels below 4.0 ng/ml can have prostate cancer and many men with PSA above 4.0 ng/ml do not have prostate cancer.  Other tests such as free PSA, age specific reference ranges, PSA velocity and PSA doubling time may be helpful especially in men less than 60 years old. Performed at [facility]   Heparin level (unfractionated)     Status: Abnormal   Collection Time: 08/20/16  1:14 PM  Result Value Ref Range   Heparin Unfractionated 0.90 (H) 0.30 - 0.70 IU/mL    Comment:        IF HEPARIN RESULTS ARE BELOW EXPECTED VALUES, AND PATIENT DOSAGE HAS BEEN CONFIRMED, SUGGEST FOLLOW UP TESTING OF ANTITHROMBIN III LEVELS.   APTT     Status: Abnormal   Collection Time: 08/20/16   1:14 PM  Result Value Ref Range   aPTT 106 (H) 24 - 36 seconds    Comment:        IF BASELINE aPTT IS ELEVATED, SUGGEST PATIENT RISK ASSESSMENT BE USED TO DETERMINE APPROPRIATE ANTICOAGULANT THERAPY. RESULT REPEATED AND VERIFIED     US Renal  Result Date: 08/19/2016 CLINICAL DATA:  Uterine tract infection. Hydronephrosis seen on CT. Distal left ureteric stone. EXAM: RENAL / URINARY TRACT ULTRASOUND COMPLETE COMPARISON:  CT of 1 day prior FINDINGS: Right Kidney: Length: 10.2 cm. Echogenicity within normal limits. No mass or hydronephrosis visualized. Left Kidney: Length: 10.6 cm. Normal renal cortical thickness for age. Minimal caliectasis is suspected, similar. Bladder: Decompressed around a Foley catheter. IMPRESSION: Minimal left-sided caliectasis is felt to be similar. Electronically Signed   By: Abigail Miyamoto M.D.   On: 08/19/2016 23:05   Dg Hand Complete Left  Result Date: 08/20/2016 CLINICAL DATA:  Left hand pain, swelling, and bruising. EXAM: LEFT HAND - COMPLETE 3+ VIEW COMPARISON:  None. FINDINGS: Suboptimal positioning due to patient's inability to cooperate and  combativeness. No evidence of acute fracture or dislocation. Visualization of fingers is limited due to there partially flexed position. 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 Generalized osteopenia noted. No evidence of osteolysis or periostitis. Soft tissue swelling seen along the dorsal aspect of the metacarpals. IMPRESSION: Dorsal soft tissue swelling.  No acute osseous abnormality. Electronically Signed   By: Earle Gell M.D.   On: 08/20/2016 18:26    Pertinent items are noted in HPI. Temp:  [97.5 F (36.4 C)-99.4 F (37.4 C)] 98 F (36.7 C) (11/05 1500) Pulse Rate:  [79-94] 94 (11/05 1500) Resp:  [19-20] 20 (11/05 1500) BP: (114-164)/(88-92) 114/90 (11/05 1500) SpO2:  [96 %-97 %] 97 % (11/05 1500) General appearance: no distress and uncooperative Cardio: regular  rate and rhythm Left hand with significant swelling and ecchimosis of dorsal hand including thumb and thenar eminence, fingers mildly swollen, some ecchimoses extending to forearm, finger tips pink, warm, with cap refill ~3sec   Assessment: Hematoma of left hand, s/p iv pull and on heparin therapy, doubt compartment syndrome seems more subcutaneous than subfascial; No fracture Plan: D/c heparin, gentle compression with ace (applied) elevation, cont to monitor finger tip color, warmth   , [name] 08/20/2016, 6:32 PM

## 2016-08-20 NOTE — Progress Notes (Addendum)
PROGRESS NOTE  [name] [id number] DOB: 03-Jul-1921 DOA: 08/18/2016 PCP: Melinda Crutch, MD  HPI/Recap of past 24 hours:  Was agitated and pulled out three iv last night, now he is having mittens on  Confused, not able to provided history, not following command  Daughter states this is not his baseline, Per home care sitter patient has agitation at night at home and spitting out meds crossed the room at home as well  Assessment/Plan: Active Problems:   Encephalopathy acute   UTI (urinary tract infection)   Pressure injury of skin   uti with metabolic encephalopathy, urinary retention, indwelling foley placed a few weeks ago, family report cloudy dark urine since Monday 10/30 no fever, no leukocytosis, bun/cr 23/1.26 close to baseline, blood culture no growth so far Ct pel on 11/3 shoed mild hydrohephrosis with left kidney, with increasing dilatation of the left renal pelvis and left ureter.Enhancement of the urothelium of the renal pelvis and ureter could be inflammatory or infectious. There is a tiny stone in the distal left ureter.  Urine culture from 10/30 + proteus sensitive to rocephin, will continue rocephin, repeat urine culture obtained from 11/3+ showed the same psa 25.75, foley management per urology. Urology Dr Jeffie Pollock input appreciated.   ckd III, d/c cozaar, renal dosing meds  HTN; d/c cozaar due to ckd , will try betablocker  Recent massive PE received thrombolysis on 10/19,  Currently  on room air, Patient is not awake enough to take oral meds,  apixaban held, initially put on heparin drip  Heparin drip stopped due to left hand hematoma.  right rectus sheath hematoma ( frome recent hospitalization) and its sequela are decreasing.  Dementia: from home  Addendum:  RN to report left hand + bruise on dorsal aspect, he did pulled out iv several times last night, hand are warm with good capillary refill, and palpable pulse,  He is on heparin for PE, will get hand  x ray and [place], hand elevation for now,  heparin stopped due to worsening of left hand hematoma, hand surgery Dr Lenon Curt consulted, input appreciated.  I have called daughter cecilia over the phone , provided update, explained to her that her father is not a candidate for anticoagulation, prognosis is guarded, daughter is interested in talking to palliative care to explore options. Palliative care consulted.  Code Status: DNR  Family Communication: patient , personal care giver in room and daughter over the phone daily  Disposition Plan: snf vs home with 24/7 care in a few days   Consultants:  Urology  Hand surgery  Palliative care  Procedures:  none  Antibiotics:  rocephin   Objective: BP (!) 164/88 (BP Location: Left Arm)   Pulse 92   Temp 99.4 F (37.4 C) (Axillary)   Resp 19   Ht 6' (1.829 m)   Wt 72.6 kg (160 lb 0.9 oz)   SpO2 96%   BMI 21.71 kg/m   Intake/Output Summary (Last 24 hours) at 08/20/16 0803 Last data filed at 08/20/16 0659  Gross per 24 hour  Intake             1761 ml  Output             2300 ml  Net             -539 ml   Filed Weights   08/18/16 1800  Weight: 72.6 kg (160 lb 0.9 oz)    Exam:   General:  Demented, foely catheter removed  Cardiovascular: RRR  Respiratory: CTABL  Abdomen: Soft/ND/NT, positive BS  Musculoskeletal: No Edema  Neuro: demented  Data Reviewed: Basic Metabolic Panel:  Recent Labs Lab 08/18/16 1309 08/19/16 0519 08/20/16 0610  NA 140 143 141  K 4.0 3.8 3.9  CL 107 109 106  CO2 28 24 24   GLUCOSE 99 90 95  BUN 29* 23* 19  CREATININE 1.44* 1.26* 1.22  CALCIUM 9.0 8.8* 8.9  MG  --   --  1.6*   Liver Function Tests:  Recent Labs Lab 08/18/16 1309 08/19/16 0519  AST 17 14*  ALT 17 15*  ALKPHOS 62 59  BILITOT 1.2 1.1  PROT 6.3* 5.6*  ALBUMIN 3.3* 3.1*    Recent Labs Lab 08/18/16 1309  LIPASE 28    Recent Labs Lab 08/18/16 1531  AMMONIA 15   CBC:  Recent Labs Lab  08/18/16 1309 08/19/16 0519 08/20/16 0610  WBC 8.4 8.1 8.8  NEUTROABS 6.0  --   --   HGB 10.2* 10.4* 10.9*  HCT 32.0* 32.8* 34.7*  MCV 87.2 88.2 87.0  PLT 311 278 287   Cardiac Enzymes:   No results for input(s): CKTOTAL, CKMB, CKMBINDEX, TROPONINI in the last 168 hours. BNP (last 3 results)  Recent Labs  07/31/16 2107  BNP 199.1*    ProBNP (last 3 results) No results for input(s): PROBNP in the last 8760 hours.  CBG: No results for input(s): GLUCAP in the last 168 hours.  Recent Results (from the past 240 hour(s))  Urine culture     Status: Abnormal   Collection Time: 08/14/16  6:05 PM  Result Value Ref Range Status   Specimen Description URINE, CATHETERIZED  Final   Special Requests NONE  Final   Culture >=100,000 COLONIES/mL PROTEUS MIRABILIS (A)  Final   Report Status 08/17/2016 FINAL  Final   Organism ID, Bacteria PROTEUS MIRABILIS (A)  Final      Susceptibility   Proteus mirabilis - MIC*    AMPICILLIN <=2 SENSITIVE Sensitive     CEFAZOLIN <=4 SENSITIVE Sensitive     CEFTRIAXONE <=1 SENSITIVE Sensitive     CIPROFLOXACIN <=0.25 SENSITIVE Sensitive     GENTAMICIN <=1 SENSITIVE Sensitive     IMIPENEM 2 SENSITIVE Sensitive     NITROFURANTOIN 128 RESISTANT Resistant     TRIMETH/SULFA <=20 SENSITIVE Sensitive     AMPICILLIN/SULBACTAM <=2 SENSITIVE Sensitive     PIP/TAZO <=4 SENSITIVE Sensitive     * >=100,000 COLONIES/mL PROTEUS MIRABILIS  Urine culture     Status: Abnormal (Preliminary result)   Collection Time: 08/18/16  1:48 PM  Result Value Ref Range Status   Specimen Description URINE, CATHETERIZED  Final   Special Requests NONE  Final   Culture >=100,000 COLONIES/mL PROTEUS MIRABILIS (A)  Final   Report Status PENDING  Incomplete  Blood culture (routine x 2)     Status: None (Preliminary result)   Collection Time: 08/18/16  4:20 PM  Result Value Ref Range Status   Specimen Description BLOOD LEFT ANTECUBITAL  Final   Special Requests BOTTLES DRAWN  AEROBIC AND ANAEROBIC 5 CC EA  Final   Culture   Final    NO GROWTH < 24 HOURS Performed at [facility]    Report Status PENDING  Incomplete  Blood culture (routine x 2)     Status: None (Preliminary result)   Collection Time: 08/18/16  4:28 PM  Result Value Ref Range Status   Specimen Description BLOOD RIGHT HAND  Final   Special  Requests BOTTLES DRAWN AEROBIC AND ANAEROBIC 5 CC EA  Final   Culture   Final    NO GROWTH < 24 HOURS Performed at [facility]    Report Status PENDING  Incomplete     Studies: US Renal  Result Date: 08/19/2016 CLINICAL DATA:  Uterine tract infection. Hydronephrosis seen on CT. Distal left ureteric stone. EXAM: RENAL / URINARY TRACT ULTRASOUND COMPLETE COMPARISON:  CT of 1 day prior FINDINGS: Right Kidney: Length: 10.2 cm. Echogenicity within normal limits. No mass or hydronephrosis visualized. Left Kidney: Length: 10.6 cm. Normal renal cortical thickness for age. Minimal caliectasis is suspected, similar. Bladder: Decompressed around a Foley catheter. IMPRESSION: Minimal left-sided caliectasis is felt to be similar. Electronically Signed   By: Abigail Miyamoto M.D.   On: 08/19/2016 23:05    Scheduled Meds: . cefTRIAXone (ROCEPHIN)  IV  1 g Intravenous Q24H  . DULoxetine  60 mg Oral Q breakfast  . gabapentin  100 mg Oral TID  . LORazepam  0.5 mg Oral QHS  . magnesium sulfate 1 - 4 g bolus IVPB  2 g Intravenous Once  . memantine  28 mg Oral Q1200  . metoprolol  2.5 mg Intravenous Q8H  . risperiDONE  0.5 mg Oral q morning - 10a  . tamsulosin  0.4 mg Oral QPC breakfast    Continuous Infusions: . sodium chloride 75 mL/hr at 08/20/16 0600  . heparin 1,050 Units/hr (08/20/16 0131)     Time spent: 14mins  Hilman Kissling MD, PhD  [facility] Pager [phone]. If 7PM-7AM, please contact night-coverage at [email or web address], password [facility] 08/20/2016, 8:03 AM  LOS: 2 days

## 2016-08-20 NOTE — Progress Notes (Signed)
MD returned text page. D/C Heparin drip. Elevate left hand and apply ice. All orders received, noted, completed.

## 2016-08-20 NOTE — Progress Notes (Signed)
Writer called AC, Tammy, to advise of pt's left hand condition. Left hand elevated and continues to "get worse". Gross edema, discoloration to fingers, palm, and moving up wrist. Capillary refill now sluggish. Ice applied. Writer text MD again at this time. Will await response.

## 2016-08-20 NOTE — Progress Notes (Signed)
Subjective: [name] had a 45mm left distal stone with mild hydronephrosis on his original CT but the renal US this morning shows only minimal left calyectesis.   He remains largely unresponsive.   He remains afebrile with a normal WBC and improving Cr.   His repeat Urine culture on 11/3 grew proteus again.   He remains on Rocephin.  ROS:  Review of Systems  Unable to perform ROS: Dementia    Anti-infectives: Anti-infectives    Start     Dose/Rate Route Frequency Ordered Stop   08/19/16 1600  cefTRIAXone (ROCEPHIN) 1 g in dextrose 5 % 50 mL IVPB     1 g 100 mL/hr over 30 Minutes Intravenous Every 24 hours 08/18/16 1746     08/18/16 1545  cefTRIAXone (ROCEPHIN) 1 g in dextrose 5 % 50 mL IVPB     1 g 100 mL/hr over 30 Minutes Intravenous  Once 08/18/16 1538 08/18/16 1702      Current Facility-Administered Medications  Medication Dose Route Frequency Provider Last Rate Last Dose  . 0.9 %  sodium chloride infusion   Intravenous Continuous Edward Hillock, MD 75 mL/hr at 08/20/16 0600    . acetaminophen (TYLENOL) tablet 650 mg  650 mg Oral Q6H PRN Edward Hillock, MD       Or  . acetaminophen (TYLENOL) suppository 650 mg  650 mg Rectal Q6H PRN Edward Hillock, MD      . cefTRIAXone (ROCEPHIN) 1 g in dextrose 5 % 50 mL IVPB  1 g Intravenous Q24H Minda Ditto, RPH   1 g at 08/19/16 1541  . DULoxetine (CYMBALTA) DR capsule 60 mg  60 mg Oral Q breakfast Edward Hillock, MD      . gabapentin (NEURONTIN) capsule 100 mg  100 mg Oral TID Edward Hillock, MD   100 mg at 08/18/16 2156  . heparin ADULT infusion 100 units/mL (25000 units/255mL sodium chloride 0.45%)  1,050 Units/hr Intravenous Continuous Leann T Poindexter, RPH 10.5 mL/hr at 08/20/16 0131 1,050 Units/hr at 08/20/16 0131  . LORazepam (ATIVAN) injection 0.5 mg  0.5 mg Intravenous TID PRN Ritta Slot, NP   0.5 mg at 08/20/16 0131  . LORazepam (ATIVAN) tablet 0.5 mg  0.5 mg Oral QHS Edward Hillock, MD   Stopped at 08/19/16 2304  . magnesium sulfate  IVPB 2 g 50 mL  2 g Intravenous Once [name], MD      . memantine (NAMENDA XR) 24 hr capsule 28 mg  28 mg Oral Q1200 Edward Hillock, MD      . metoprolol (LOPRESSOR) injection 2.5 mg  2.5 mg Intravenous Q8H [name], MD   2.5 mg at 08/20/16 0507  . ondansetron (ZOFRAN) tablet 4 mg  4 mg Oral Q6H PRN Edward Hillock, MD       Or  . ondansetron (ZOFRAN) injection 4 mg  4 mg Intravenous Q6H PRN Edward Hillock, MD   4 mg at 08/19/16 2345  . RESOURCE THICKENUP CLEAR 1 Container  1 Container Oral PRN Edward Hillock, MD      . risperiDONE (RISPERDAL) tablet 0.5 mg  0.5 mg Oral q morning - 10a Edward Hillock, MD      . tamsulosin (FLOMAX) capsule 0.4 mg  0.4 mg Oral QPC breakfast Edward Hillock, MD         Objective: Vital signs in last 24 hours: Temp:  [97.5 F (36.4 C)-99.4 F (37.4 C)] 99.4 F (37.4  C) (11/05 0659) Pulse Rate:  [72-92] 92 (11/05 0659) Resp:  [19-20] 19 (11/05 0659) BP: (150-164)/(86-92) 164/88 (11/05 0659) SpO2:  [94 %-97 %] 96 % (11/05 0659)  Intake/Output from previous day: 11/04 0701 - 11/05 0700 In: 1771 [P.O.:10; I.V.:1761] Out: 2300 [Urine:2300] Intake/Output this shift: No intake/output data recorded.   Physical Exam  Constitutional: He is well-developed, well-nourished, and in no distress.  But he is somewhat restless and in mittens.   He is A/O x 0.   Abdominal: Soft. There is tenderness (with a grunting response to palpation that doesn't localize.  There is no left CVAT. ).  Vitals reviewed.   Lab Results:   Recent Labs  08/19/16 0519 08/20/16 0610  WBC 8.1 8.8  HGB 10.4* 10.9*  HCT 32.8* 34.7*  PLT 278 287   BMET  Recent Labs  08/19/16 0519 08/20/16 0610  NA 143 141  K 3.8 3.9  CL 109 106  CO2 24 24  GLUCOSE 90 95  BUN 23* 19  CREATININE 1.26* 1.22  CALCIUM 8.8* 8.9   PT/INR No results for input(s): LABPROT, INR in the last 72 hours. ABG  Recent Labs  08/18/16 1530  HCO3 26.0    Studies/Results: Dg Chest 2 View  Result Date:  08/18/2016 CLINICAL DATA:  Altered mental status. History of dementia, hypertension and pneumonia. EXAM: CHEST  2 VIEW COMPARISON:  CT chest August 03, 2016 FINDINGS: The cardiac silhouette is moderately enlarged. Calcified aortic knob. Small to moderate layering pleural effusion seen on the lateral radiograph. Mild bronchitic changes. Granuloma projects in LEFT upper lobe. No pneumothorax. Osteopenia. Moderate degenerative change of the spine. IMPRESSION: Small to moderate layering pleural effusion. Mild bronchitic changes. Stable cardiomegaly. Electronically Signed   By: [name] M.D.   On: 08/18/2016 15:20   Ct Abdomen Pelvis W Contrast  Result Date: 08/18/2016 CLINICAL DATA:  Cloudy dark urine since Monday. EXAM: CT ABDOMEN AND PELVIS WITH CONTRAST TECHNIQUE: Multidetector CT imaging of the abdomen and pelvis was performed using the standard protocol following bolus administration of intravenous contrast. CONTRAST:  68mL ISOVUE-300 IOPAMIDOL (ISOVUE-300) INJECTION 61% COMPARISON:  August 07, 2016 FINDINGS: Lower chest: Small bilateral pleural effusions identified. Coronary artery calcifications. No other lung base abnormalities. Hepatobiliary: There is a probable tiny cyst in the lower right hepatic lobe on series 2, image 33, too small to characterize. No suspicious hepatic masses. Limited views of the portal vein are normal. The gallbladder is unremarkable. Pancreas: Unremarkable. No pancreatic ductal dilatation or surrounding inflammatory changes. Spleen: Splenic calcified granulomas.  No other abnormalities. Adrenals/Urinary Tract: The adrenal glands are normal. There is a small neoplasm off the posterior lower pole of the right kidney on series 2, image 41, not significantly changed since 2015. There is an indeterminate exophytic lesion off the mid right kidney posteriorly on series 2, image 34. No suspicious solid masses are seen on the left. No hydronephrosis on the right. No right ureteral  stones. There is mild hydronephrosis on the left and increasing prominence of the left renal pelvis. There is increased attenuation in the fat around the left renal pelvis and enhancement of the urothelium of the left renal pelvis and ureter. The ureter is newly dilated and there is a tiny punctate stone measuring 2 mm in the distal left ureter on series 2, image 77. The bladder is decompressed with a Foley catheter. There is a cyst off the upper pole left kidney. Stomach/Bowel: The stomach and small bowel are normal. The stool ball  seen on the previous study has resolved. Fecal loading remains throughout the colon, decreased in the interval. A few scattered colonic diverticuli are seen without diverticulitis. The appendix is normal in appearance. Vascular/Lymphatic: Atherosclerotic change seen throughout the aorta and branching vessels. No aneurysm or dissection. No adenopathy. Reproductive: Prostate is unremarkable. Other: The right rectus sheath hematoma persists but is smaller in the interval. Stranding in blood products are seen in the right pelvis on series 2, image 72, also decreased. No free air or free fluid. Musculoskeletal: No bony changes. IMPRESSION: 1. Mild hydronephrosis associated with the left kidney with increasing dilatation of the left renal pelvis and left ureter. There is a tiny stone in the distal left ureter. Enhancement of the urothelium of the renal pelvis and ureter could be inflammatory or infectious. The bladder is now decompressed with a Foley catheter. 2. Probable renal cell carcinoma off the lower pole the right kidney unchanged since 2015. 1 or 2 other indeterminate lesions are associated with the right kidney. These findings are similar since 2015. 3. The right rectus sheath hematoma and its sequela are decreasing. 4. Atherosclerosis. Electronically Signed   By: [name] M.D   On: 08/18/2016 15:33   US Renal  Result Date: 08/19/2016 CLINICAL DATA:  Uterine tract  infection. Hydronephrosis seen on CT. Distal left ureteric stone. EXAM: RENAL / URINARY TRACT ULTRASOUND COMPLETE COMPARISON:  CT of 1 day prior FINDINGS: Right Kidney: Length: 10.2 cm. Echogenicity within normal limits. No mass or hydronephrosis visualized. Left Kidney: Length: 10.6 cm. Normal renal cortical thickness for age. Minimal caliectasis is suspected, similar. Bladder: Decompressed around a Foley catheter. IMPRESSION: Minimal left-sided caliectasis is felt to be similar. Electronically Signed   By: [name] M.D.   On: 08/19/2016 23:05   I have reviewed his labs and renal US films and report.   I spoke to his daughter this morning and gave her an update.   Assessment and Plan: 1. Tiny left distal stone with hydronephrosis which has largely resolved on [place] this morning.   So I don't see that he needs any urologic intervention at this time.   2.  Urinary retention but he has been trying to pull on his foley and at this time I think it is best to take it out and see how he does without it.   I have placed the order.  I will have bladder scans done qshift x 3  after the foley is removed.   3. Proteus UTI.  Transition to oral agent when appropriate.          LOS: 2 days    [name], [name] 08/20/2016 [id number] ID: [name], male   DOB: April 30, 1921, 82 y.o.   MRN: [id number]

## 2016-08-20 NOTE — Progress Notes (Signed)
Nutrition Brief Note  Patient identified on the Malnutrition Screening Tool (MST) Report  Patient currently NPO. RD spoke with RN outside of room before entering. Pt with sitter at bedside. RN states pt is agitated and was screaming out moments before RD came to unit. Pt just now fell asleep and is calm. RD will attempt assessment at a later date.   Pt with stage II sacral ulcer, would likely benefit from supplements once diet is advanced.  Wt Readings from Last 15 Encounters:  08/18/16 160 lb 0.9 oz (72.6 kg)  08/14/16 186 lb (84.4 kg)  08/08/16 186 lb 15.2 oz (84.8 kg)  03/09/15 160 lb (72.6 kg)  03/01/15 160 lb (72.6 kg)  01/28/13 126 lb 15.8 oz (57.6 kg)  09/19/12 150 lb (68 kg)    Body mass index is 21.71 kg/m. Patient meets criteria for normal range based on current BMI.   Current diet order is NPO. Labs and medications reviewed.   No nutrition interventions warranted at this time. RD will attempt assessment once diet is advanced. If nutrition issues arise, please consult RD.   Clayton Bibles, MS, RD, LDN Pager: [phone] After Hours Pager: [phone]

## 2016-08-20 NOTE — Progress Notes (Signed)
Apply pressure dressing to left hand per MD. Completed.

## 2016-08-20 NOTE — Progress Notes (Signed)
Writer reported to pharmacy pt's issues with left hand and possible bleeding hematoma. Heparin drip continues at 9.5 cc/hr.

## 2016-08-20 NOTE — Progress Notes (Addendum)
ANTICOAGULATION CONSULT NOTE - Initial Consult  Pharmacy Consult for heparin Indication: hx recent pulmonary embolus  Allergies  Allergen Reactions  . Amoxicillin Diarrhea  . Codeine Other (See Comments)  . Feldene [Piroxicam] Other (See Comments)  . Ibuprofen Other (See Comments)    H/o bleeding (per pt's daughter)  . Indocin [Indomethacin] Other (See Comments)  . Morphine And Related Nausea And Vomiting  . Other     omnicor- unknown reaction  . Uroxatral [Alfuzosin Hcl Er] Other (See Comments)    Patient Measurements: Height: 6' (182.9 cm) Weight: 160 lb 0.9 oz (72.6 kg) IBW/kg (Calculated) : 77.6 Heparin Dosing Weight: 72 kg  Vital Signs: Temp: 99.4 F (37.4 C) (11/05 0659) Temp Source: Axillary (11/05 0659) BP: 164/88 (11/05 0659) Pulse Rate: 92 (11/05 0659)  Labs:  Recent Labs  08/18/16 1309 08/19/16 0519 08/19/16 1540 08/19/16 2236 08/20/16 0603 08/20/16 0610  HGB 10.2* 10.4*  --   --   --  10.9*  HCT 32.0* 32.8*  --   --   --  34.7*  PLT 311 278  --   --   --  287  APTT  --   --  37* 44* 44*  --   HEPARINUNFRC  --   --  1.06* 1.07* 0.80*  --   CREATININE 1.44* 1.26*  --   --   --  1.22    Estimated Creatinine Clearance: 38.8 mL/min (by C-G formula based on SCr of 1.22 mg/dL).   Medical History: Past Medical History:  Diagnosis Date  . Anxiety   . BPH (benign prostatic hypertrophy)   . Constipation, chronic   . Dementia    situational  . Diverticular hemorrhage 2014  . Frequency of urination   . Hemorrhoids    "lift chair helped"  . Hypertension   . Kidney stone on left side   . PE (pulmonary thromboembolism) ([place])   . Pneumonia    hx of age 27  . Seizures ([place])   . Skin cancer   . 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  Patient is not alert enough at this time to take oral meds.  To change Eliquis to heparin for PE until patient is able to oral medications.  Last Eliquis dose taken at 10PM on 11/03.  Today, 08/20/2016: - heparin level at 6 AM is elevated at 0.80 (but likely from residual effect of Eliquis), aPTT low at 44.  RN reported that patient was agitated and pulled out his IV line several times during the night-- with the most recent IV line replaced at 5 AM. RN is not sure  how long heparin drip was off prior to new line access. - cbc stable - no bleeding documented   Goal of Therapy:  Heparin level 0.3-0.7 units/ml aPTT 66-102 seconds Monitor platelets by anticoagulation protocol: Yes   Plan:  - With uncertainty of how accurate levels are this morning d/t line issues, will continue heparin drip at 1050 units/hr for now.   - Will recheck aPTT and heparin level at 1 PM today - monitor for s/s of bleeding  Shreyansh Tiffany P 08/20/2016,9:39 AM   ________________________  Adden: repeat heparin level now back as 0.90 and  aPTT at 106 secs.  Per RN, no issues with IV line and no bleeding noted. Will decrease heparin drip to 950 units/hr and check another 8 hr heparin level and aPTT to assess new rate Dia Sitter, PharmD, BCPS 08/20/2016 3:02 PM .

## 2016-08-20 NOTE — Progress Notes (Signed)
PHARMACY - HEPARIN (brief note)  Patient on IV heparin for h/o recent pulmonary embolism (PTA Eliquis held due to inability to take PO meds)  IV heparin infusing @ 900 units/hr APTT = 44 sec (Goal 66-102 sec) Heparin level = 1.07 (falsely elevated due to effects of Eliquis)  No complications of therapy noted  Plan: Increase heparin to 1050 units/hr Check aPTT and Heparin Level 8 hrs after rate increase  Leone Haven, PharmD 08/20/16 @ 00:18

## 2016-08-21 DIAGNOSIS — R1310 Dysphagia, unspecified: Secondary | ICD-10-CM

## 2016-08-21 DIAGNOSIS — Z515 Encounter for palliative care: Secondary | ICD-10-CM

## 2016-08-21 DIAGNOSIS — Z66 Do not resuscitate: Secondary | ICD-10-CM

## 2016-08-21 LAB — BASIC METABOLIC PANEL
ANION GAP: 8 (ref 5–15)
BUN: 15 mg/dL (ref 6–20)
CALCIUM: 8.6 mg/dL — AB (ref 8.9–10.3)
CHLORIDE: 107 mmol/L (ref 101–111)
CO2: 25 mmol/L (ref 22–32)
Creatinine, Ser: 1.06 mg/dL (ref 0.61–1.24)
GFR calc Af Amer: >60 mL/min (ref >60)
GFR calc non Af Amer: 58 mL/min — ABNORMAL LOW (ref >60)
GLUCOSE: 102 mg/dL — AB (ref 65–99)
POTASSIUM: 3.6 mmol/L (ref 3.5–5.1)
Sodium: 140 mmol/L (ref 135–145)

## 2016-08-21 LAB — CBC
HEMATOCRIT: 31 % — AB (ref 39.0–52.0)
HEMOGLOBIN: 10 g/dL — AB (ref 13.0–17.0)
MCH: 27.8 pg (ref 26.0–34.0)
MCHC: 32.3 g/dL (ref 30.0–36.0)
MCV: 86.1 fL (ref 78.0–100.0)
Platelets: 287 10*3/uL (ref 150–400)
RBC: 3.6 MIL/uL — ABNORMAL LOW (ref 4.22–5.81)
RDW: 15.1 % (ref 11.5–15.5)
WBC: 9.5 10*3/uL (ref 4.0–10.5)

## 2016-08-21 LAB — MAGNESIUM: Magnesium: 1.7 mg/dL (ref 1.7–2.4)

## 2016-08-21 MED ORDER — LIP MEDEX EX OINT
TOPICAL_OINTMENT | CUTANEOUS | Status: AC
  Administered 2016-08-21: 1
  Filled 2016-08-21: qty 7

## 2016-08-21 MED ORDER — MAGNESIUM SULFATE 2 GM/50ML IV SOLN
2.0000 g | Freq: Once | INTRAVENOUS | Status: AC
  Administered 2016-08-21: 2 g via INTRAVENOUS
  Filled 2016-08-21: qty 50

## 2016-08-21 MED ORDER — CEFAZOLIN IN D5W 1 GM/50ML IV SOLN
1.0000 g | Freq: Two times a day (BID) | INTRAVENOUS | Status: DC
  Administered 2016-08-21 – 2016-08-24 (×6): 1 g via INTRAVENOUS
  Filled 2016-08-21 (×8): qty 50

## 2016-08-21 NOTE — Progress Notes (Signed)
Bladder scanned patient. 0 ml found.

## 2016-08-21 NOTE — Evaluation (Signed)
SLP Cancellation Note  Patient Details Name: [name] MRN: [id number] DOB: Apr 22, 1921   Cancelled treatment:       Reason Eval/Treat Not Completed: Other (comment) (per RN, palliative meeting conducted and daughter going to speak to other family members, RN reports pt moderately resistant to oral care, please advise if desire SlP eval or if want cancelled due to progression of plans; thanks)  Luanna Salk, [facility] SLP [phone]

## 2016-08-21 NOTE — Progress Notes (Addendum)
New large blisters on left hand. Also, drainage noted on ace bandage. Paged Dr. Erlinda Hong. Dr. Erlinda Hong wanted input from Dr. Lenon Curt. Paged Dr. Lenon Curt. Removed ace bandage per Dr. Lenon Curt & took pictures to send to show Dr. Lenon Curt updates on hand & arm. He stated to place xeroform on blisters and oozing areas & cover with loose fitting kerlix. Dr. Lenon Curt stated the swelling actually improved from yesterday. Dr. Lenon Curt also asked to place consult to wound RN.

## 2016-08-21 NOTE — Progress Notes (Signed)
Pt bladder scan showed performed with 151cc showing. Pt has been voiding incontinent throughout the night without difficulty. Will continue to monitor.

## 2016-08-21 NOTE — Consult Note (Signed)
Consultation Note Date: 08/21/2016   Patient Name: [name]  DOB: Oct 07, 1921  MRN: [id number]  Age / Sex: 81 y.o., male  PCP: [name], MD Referring Physician: [name], MD  Reason for Consultation: Establishing goals of care and Psychosocial/spiritual support  HPI/Patient Profile: 82 y.o. male  admitted on 08/18/2016 with  history of massive pulmonary embolism/ 07/2016, on chronic anticoagulation with eliquis (family was given grim prognosis at that time), dementia, urinary retention status post Foley catheter placement, dysphagia was brought to the hospital from home after home health RN noted patient to have cloudy urine in the Foley catheter.   Patient with increased physical, functional and cognitive decline and transported thru the ED  UA was abnormal in the ED, with positive nitrite, 6-30 WBCs   Admitted,  empirically started on ceftriaxone for UTI.   Patient has 24 caregivers in the home. Currently patient is confused and disoriented, not taking POs, and unable to support himself nutritionally.  HPOA leaning toward a comfort approach.    Family is faced with advanced directive decisions and anticipatory care needs  Clinical Assessment and Goals of Care:   This NP Wadie Lessen reviewed medical records, received report from team, assessed the patient and then meet at the patient's bedside along with his daughter by phone/ [name]  to discuss diagnosis, prognosis, GOC, EOL wishes disposition and options.  I also spoke with [name] RN with [facility].  A detailed discussion was had today regarding advanced directives.  Concepts specific to code status, artifical feeding and hydration, continued IV antibiotics and rehospitalization was had.  The difference between a aggressive medical intervention path  and a palliative comfort care path for this patient at this time was had.  Values  and goals of care important to patient and family were attempted to be elicited.  Concept of Hospice and Palliative Care were discussed  Natural trajectory and expectations at EOL were discussed.  Questions and concerns addressed.   Family encouraged to call with questions or concerns.  PMT will continue to support holistically.    HCPOA is daughter [name]    SUMMARY OF RECOMMENDATIONS    Code Status/Advance Care Planning:  DNR   Palliative Prophylaxis:   Aspiration, Delirium Protocol, Frequent Pain Assessment and Oral Care  Additional Recommendations (Limitations, Scope, Preferences):  Treatment plan remains unchanged until daughter has the chance to speak with other family members tonight.  HPOA is leaning toward a full comfort path, at home with full time caregivers and hospice services.  Will solidify  [place] in the morning    Phone call scheduled for 0830  Psycho-social/Spiritual:   Desire for further Chaplaincy support:no  Additional Recommendations: Education on Hospice  Prognosis:   Will document in the morning after GOC are clearly established    Discharge Planning: Likely home with full time caregivers and hospcie services     Primary Diagnoses: Present on Admission: . UTI (urinary tract infection) . Encephalopathy acute   I have reviewed the medical  record, interviewed the patient and family, and examined the patient. The following aspects are pertinent.  Past Medical History:  Diagnosis Date  . Anxiety   . BPH (benign prostatic hypertrophy)   . Constipation, chronic   . Dementia    situational  . Diverticular hemorrhage 2014  . Frequency of urination   . Hemorrhoids    "lift chair helped"  . Hypertension   . Kidney stone on left side   . PE (pulmonary thromboembolism) ([place])   . Pneumonia    hx of age 52  . Seizures ([place])   . Skin cancer   . Transient global amnesia 15-20 years ago   Social History   Social History  . Marital  status: Married    Spouse name: N/A  . Number of children: N/A  . Years of education: N/A   Social History Main Topics  . Smoking status: Former Smoker    Packs/day: 2.00    Years: 10.00    Types: Cigarettes  . Smokeless tobacco: Never Used     Comment: quit years ago  . Alcohol use No  . Drug use: No  . Sexual activity: Not Asked   Other Topics Concern  . None   Social History Narrative  . None   No family history on file. Scheduled Meds: . cefTRIAXone (ROCEPHIN)  IV  1 g Intravenous Q24H  . DULoxetine  60 mg Oral Q breakfast  . gabapentin  100 mg Oral TID  . lip balm      . LORazepam  0.5 mg Oral QHS  . magnesium sulfate 1 - 4 g bolus IVPB  2 g Intravenous Once  . memantine  28 mg Oral Q1200  . metoprolol  2.5 mg Intravenous Q6H  . risperiDONE  0.5 mg Oral q morning - 10a  . tamsulosin  0.4 mg Oral QPC breakfast   Continuous Infusions: . sodium chloride 75 mL/hr at 08/21/16 1106   PRN Meds:.acetaminophen **OR** acetaminophen, haloperidol lactate, LORazepam, LORazepam, ondansetron **OR** ondansetron (ZOFRAN) IV, RESOURCE THICKENUP CLEAR Medications Prior to Admission:  Prior to Admission medications   Medication Sig Start Date End Date Taking? Authorizing Provider  apixaban (ELIQUIS) 5 MG TABS tablet Take 1 tablet (5 mg total) by mouth 2 (two) times daily. 08/14/16  Yes Nita Sells, MD  DULoxetine (CYMBALTA) 60 MG capsule Take 60 mg by mouth daily with breakfast.  07/31/16  Yes Historical Provider, MD  gabapentin (NEURONTIN) 100 MG capsule Take 100 mg by mouth 3 (three) times daily. 07/31/16  Yes Historical Provider, MD  gabapentin (NEURONTIN) 300 MG capsule Take 300 mg by mouth at bedtime.   Yes Historical Provider, MD  LORazepam (ATIVAN) 0.5 MG tablet Take 0.25-0.5 mg by mouth 3 (three) times daily as needed for anxiety. For agitation or anxiety   Yes Historical Provider, MD  LORazepam (ATIVAN) 0.5 MG tablet Take 0.5 mg by mouth at bedtime.   Yes Historical  Provider, MD  losartan (COZAAR) 50 MG tablet Take 50 mg by mouth daily with breakfast.  07/31/16  Yes Historical Provider, MD  Maltodextrin-Xanthan Gum (Divide) POWD Take 1 Container by mouth as needed. 08/09/16  Yes Nita Sells, MD  memantine (NAMENDA XR) 28 MG CP24 24 hr capsule Take 28 mg by mouth daily at 12 noon.    Yes Historical Provider, MD  risperiDONE (RISPERDAL) 0.5 MG tablet Take 0.5 mg by mouth every morning.    Yes Historical Provider, MD  Tamsulosin HCl (FLOMAX) 0.4 MG CAPS Take  0.4 mg by mouth daily after breakfast.    Yes Historical Provider, MD  apixaban (ELIQUIS) 5 MG TABS tablet Take 2 tablets (10 mg total) by mouth 2 (two) times daily. Patient not taking: Reported on 08/18/2016 08/09/16   Nita Sells, MD   Allergies  Allergen Reactions  . Amoxicillin Diarrhea  . Codeine Other (See Comments)  . Feldene [Piroxicam] Other (See Comments)  . Ibuprofen Other (See Comments)    H/o bleeding (per pt's daughter)  . Indocin [Indomethacin] Other (See Comments)  . Morphine And Related Nausea And Vomiting  . Other     omnicor- unknown reaction  . Uroxatral [Alfuzosin Hcl Er] Other (See Comments)   Review of Systems  Unable to perform ROS: Acuity of condition    Physical Exam  Constitutional: He appears well-developed. He appears lethargic. He appears ill.  HENT:  Mouth/Throat: Oropharynx is clear and moist.  Cardiovascular: Tachycardia present.   Pulmonary/Chest: He has decreased breath sounds in the right lower field and the left lower field.  Neurological: He appears lethargic.  Skin: Skin is warm and dry.    Vital Signs: BP (!) 152/84 (BP Location: Right Arm)   Pulse (!) 103   Temp 98.7 F (37.1 C) (Oral)   Resp 18   Ht 6' (1.829 m)   Wt 72.6 kg (160 lb 0.9 oz)   SpO2 100%   BMI 21.71 kg/m  Pain Assessment: PAINAD   Pain Score: 0-No pain   SpO2: SpO2: 100 % O2 Device:SpO2: 100 % O2 Flow Rate: .O2 Flow Rate (L/min): 2  L/min  IO: Intake/output summary:  Intake/Output Summary (Last 24 hours) at 08/21/16 1133 Last data filed at 08/21/16 0600  Gross per 24 hour  Intake             1725 ml  Output                0 ml  Net             1725 ml    LBM: Last BM Date: 08/20/16 Baseline Weight: Weight: 72.6 kg (160 lb 0.9 oz) Most recent weight: Weight: 72.6 kg (160 lb 0.9 oz)      Palliative Assessment/Data: 20%   Discussed with Dr Erlinda Hong  Time In: 1200 Time Out: 1315 Time Total: 75 min Greater than 50%  of this time was spent counseling and coordinating care related to the above assessment and plan.  Signed by: Wadie Lessen, NP   Please contact Palliative Medicine Team phone at [phone] for questions and concerns.  For individual provider: See Shea Evans

## 2016-08-21 NOTE — Progress Notes (Signed)
Pt is active with [facility] for RN and PT.  Will need resumption orders at discharge. CM will continue to follow. Marney Doctor RN,BSN,NCM [phone]

## 2016-08-21 NOTE — Progress Notes (Signed)
PROGRESS NOTE  [name] [id number] DOB: 12-Mar-1921 DOA: 08/18/2016 PCP: Melinda Crutch, MD  HPI/Recap of past 24 hours:  Sleeping with intermittent agitation when disturbed, sitter in room  Assessment/Plan: Active Problems:   Encephalopathy acute   UTI (urinary tract infection)   Pressure injury of skin   uti with metabolic encephalopathy, urinary retention, indwelling foley placed a few weeks ago, family report cloudy dark urine since Monday 10/30 no fever, no leukocytosis, bun/cr 23/1.26 close to baseline, blood culture no growth so far Ct pel on 11/3 shoed mild hydrohephrosis with left kidney, with increasing dilatation of the left renal pelvis and left ureter.Enhancement of the urothelium of the renal pelvis and ureter could be inflammatory or infectious. There is a tiny stone in the distal left ureter.  Urine culture from 10/30 + proteus sensitive to rocephin, will continue rocephin, repeat urine culture obtained from 11/3+ showed the same psa 25.75, foley management per urology. Urology Dr Jeffie Pollock input appreciated.   ckd III, d/c cozaar, renal dosing meds  HTN; d/c cozaar due to ckd , will try betablocker  Recent massive PE received thrombolysis on 10/19,  Currently  on room air, Patient is not awake enough to take oral meds,  apixaban held, initially put on heparin drip  Heparin drip stopped due to left hand hematoma.  right rectus sheath hematoma ( frome recent hospitalization) and its sequela are decreasing.  Dementia: from home  Hematoma left hand: Heparin drip d/ced, hand x ray no fracture, us+ 6cm hematoma, hand elevation,. hand surgery Dr Lenon Curt consulted, input appreciated.  FTT: Palliative care consulted.  Code Status: DNR  Family Communication: patient , personal care giver in room and daughter over the phone daily  Disposition Plan: possible home with home hospice, likely 11/7   Consultants:  Urology  Hand surgery  Palliative  care  Procedures:  none  Antibiotics:  rocephin   Objective: BP (!) 152/84 (BP Location: Right Arm)   Pulse (!) 103   Temp 98.7 F (37.1 C) (Oral)   Resp 18   Ht 6' (1.829 m)   Wt 72.6 kg (160 lb 0.9 oz)   SpO2 100%   BMI 21.71 kg/m   Intake/Output Summary (Last 24 hours) at 08/21/16 0848 Last data filed at 08/21/16 0600  Gross per 24 hour  Intake             1725 ml  Output              250 ml  Net             1475 ml   Filed Weights   08/18/16 1800  Weight: 72.6 kg (160 lb 0.9 oz)    Exam:   General:  Demented, foely catheter removed  Cardiovascular: RRR  Respiratory: CTABL  Abdomen: Soft/ND/NT, positive BS  Musculoskeletal: No Edema  Neuro: demented  Data Reviewed: Basic Metabolic Panel:  Recent Labs Lab 08/18/16 1309 08/19/16 0519 08/20/16 0610 08/21/16 0638  NA 140 143 141 140  K 4.0 3.8 3.9 3.6  CL 107 109 106 107  CO2 28 24 24 25   GLUCOSE 99 90 95 102*  BUN 29* 23* 19 15  CREATININE 1.44* 1.26* 1.22 1.06  CALCIUM 9.0 8.8* 8.9 8.6*  MG  --   --  1.6* 1.7   Liver Function Tests:  Recent Labs Lab 08/18/16 1309 08/19/16 0519  AST 17 14*  ALT 17 15*  ALKPHOS 62 59  BILITOT 1.2 1.1  PROT 6.3*  5.6*  ALBUMIN 3.3* 3.1*    Recent Labs Lab 08/18/16 1309  LIPASE 28    Recent Labs Lab 08/18/16 1531  AMMONIA 15   CBC:  Recent Labs Lab 08/18/16 1309 08/19/16 0519 08/20/16 0610 08/21/16 0638  WBC 8.4 8.1 8.8 9.5  NEUTROABS 6.0  --   --   --   HGB 10.2* 10.4* 10.9* 10.0*  HCT 32.0* 32.8* 34.7* 31.0*  MCV 87.2 88.2 87.0 86.1  PLT 311 278 287 287   Cardiac Enzymes:   No results for input(s): CKTOTAL, CKMB, CKMBINDEX, TROPONINI in the last 168 hours. BNP (last 3 results)  Recent Labs  07/31/16 2107  BNP 199.1*    ProBNP (last 3 results) No results for input(s): PROBNP in the last 8760 hours.  CBG: No results for input(s): GLUCAP in the last 168 hours.  Recent Results (from the past 240 hour(s))  Urine  culture     Status: Abnormal   Collection Time: 08/14/16  6:05 PM  Result Value Ref Range Status   Specimen Description URINE, CATHETERIZED  Final   Special Requests NONE  Final   Culture >=100,000 COLONIES/mL PROTEUS MIRABILIS (A)  Final   Report Status 08/17/2016 FINAL  Final   Organism ID, Bacteria PROTEUS MIRABILIS (A)  Final      Susceptibility   Proteus mirabilis - MIC*    AMPICILLIN <=2 SENSITIVE Sensitive     CEFAZOLIN <=4 SENSITIVE Sensitive     CEFTRIAXONE <=1 SENSITIVE Sensitive     CIPROFLOXACIN <=0.25 SENSITIVE Sensitive     GENTAMICIN <=1 SENSITIVE Sensitive     IMIPENEM 2 SENSITIVE Sensitive     NITROFURANTOIN 128 RESISTANT Resistant     TRIMETH/SULFA <=20 SENSITIVE Sensitive     AMPICILLIN/SULBACTAM <=2 SENSITIVE Sensitive     PIP/TAZO <=4 SENSITIVE Sensitive     * >=100,000 COLONIES/mL PROTEUS MIRABILIS  Urine culture     Status: Abnormal   Collection Time: 08/18/16  1:48 PM  Result Value Ref Range Status   Specimen Description URINE, CATHETERIZED  Final   Special Requests NONE  Final   Culture >=100,000 COLONIES/mL PROTEUS MIRABILIS (A)  Final   Report Status 08/20/2016 FINAL  Final   Organism ID, Bacteria PROTEUS MIRABILIS (A)  Final      Susceptibility   Proteus mirabilis - MIC*    AMPICILLIN <=2 SENSITIVE Sensitive     CEFAZOLIN <=4 SENSITIVE Sensitive     CEFTRIAXONE <=1 SENSITIVE Sensitive     CIPROFLOXACIN <=0.25 SENSITIVE Sensitive     GENTAMICIN <=1 SENSITIVE Sensitive     IMIPENEM 2 SENSITIVE Sensitive     NITROFURANTOIN 128 RESISTANT Resistant     TRIMETH/SULFA <=20 SENSITIVE Sensitive     AMPICILLIN/SULBACTAM <=2 SENSITIVE Sensitive     PIP/TAZO <=4 SENSITIVE Sensitive     * >=100,000 COLONIES/mL PROTEUS MIRABILIS  Blood culture (routine x 2)     Status: None (Preliminary result)   Collection Time: 08/18/16  4:20 PM  Result Value Ref Range Status   Specimen Description BLOOD LEFT ANTECUBITAL  Final   Special Requests BOTTLES DRAWN AEROBIC AND  ANAEROBIC 5 CC EA  Final   Culture   Final    NO GROWTH 2 DAYS Performed at [facility]    Report Status PENDING  Incomplete  Blood culture (routine x 2)     Status: None (Preliminary result)   Collection Time: 08/18/16  4:28 PM  Result Value Ref Range Status   Specimen Description BLOOD RIGHT HAND  Final   Special Requests BOTTLES DRAWN AEROBIC AND ANAEROBIC 5 CC EA  Final   Culture   Final    NO GROWTH 2 DAYS Performed at [facility]    Report Status PENDING  Incomplete     Studies: [place] Extrem Up Left Ltd  Result Date: 08/20/2016 CLINICAL DATA:  Dorsal left hand soft tissue swelling. EXAM: ULTRASOUND LEFT UPPER EXTREMITY LIMITED TECHNIQUE: Ultrasound examination of the dorsal left hand soft tissues was performed in the area of clinical concern. COMPARISON:  None FINDINGS: A large mixed echogenicity fluid collection with predominant hyperechoic echotexture is seen in the area of clinical concern in the dorsal soft tissues of the hand. This measures 6.1 x 2.0 x 5.4 cm. No internal blood flow is seen within this collection, and there is no significant peripheral hyperemia on color Doppler ultrasound. This has features suspicious for hematoma, with abscess considered less likely. IMPRESSION: 6.1 cm fluid collection in the area of clinical concern in the dorsal left hand soft tissues. This has sonographic characteristics favoring hematoma over abscess. Electronically Signed   By: Earle Gell M.D.   On: 08/20/2016 18:30   Dg Hand Complete Left  Result Date: 08/20/2016 CLINICAL DATA:  Left hand pain, swelling, and bruising. EXAM: LEFT HAND - COMPLETE 3+ VIEW COMPARISON:  None. FINDINGS: Suboptimal positioning due to patient's inability to cooperate and combativeness. No evidence of acute fracture or dislocation. Visualization of fingers is limited due to there partially flexed position. 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  Generalized osteopenia noted. No evidence of osteolysis or periostitis. Soft tissue swelling seen along the dorsal aspect of the metacarpals. IMPRESSION: Dorsal soft tissue swelling.  No acute osseous abnormality. Electronically Signed   By: Earle Gell M.D.   On: 08/20/2016 18:26    Scheduled Meds: . cefTRIAXone (ROCEPHIN)  IV  1 g Intravenous Q24H  . DULoxetine  60 mg Oral Q breakfast  . gabapentin  100 mg Oral TID  . LORazepam  0.5 mg Oral QHS  . magnesium sulfate 1 - 4 g bolus IVPB  2 g Intravenous Once  . memantine  28 mg Oral Q1200  . metoprolol  2.5 mg Intravenous Q6H  . risperiDONE  0.5 mg Oral q morning - 10a  . tamsulosin  0.4 mg Oral QPC breakfast    Continuous Infusions: . sodium chloride 75 mL/hr at 08/21/16 0600  . heparin Stopped (08/20/16 1730)     Time spent: 66mins  Jermya Dowding MD, PhD  [facility] Pager [phone]. If 7PM-7AM, please contact night-coverage at [email or web address], password [facility] 08/21/2016, 8:48 AM  LOS: 3 days

## 2016-08-22 DIAGNOSIS — T148XXA Other injury of unspecified body region, initial encounter: Secondary | ICD-10-CM

## 2016-08-22 DIAGNOSIS — R627 Adult failure to thrive: Secondary | ICD-10-CM

## 2016-08-22 LAB — BASIC METABOLIC PANEL
ANION GAP: 10 (ref 5–15)
BUN: 16 mg/dL (ref 6–20)
CHLORIDE: 110 mmol/L (ref 101–111)
CO2: 20 mmol/L — AB (ref 22–32)
Calcium: 8.5 mg/dL — ABNORMAL LOW (ref 8.9–10.3)
Creatinine, Ser: 1.33 mg/dL — ABNORMAL HIGH (ref 0.61–1.24)
GFR calc non Af Amer: 44 mL/min — ABNORMAL LOW (ref >60)
GFR, EST AFRICAN AMERICAN: 51 mL/min — AB (ref >60)
Glucose, Bld: 93 mg/dL (ref 65–99)
POTASSIUM: 3.8 mmol/L (ref 3.5–5.1)
Sodium: 140 mmol/L (ref 135–145)

## 2016-08-22 LAB — CBC
HEMATOCRIT: 32.2 % — AB (ref 39.0–52.0)
HEMOGLOBIN: 10.4 g/dL — AB (ref 13.0–17.0)
MCH: 27.8 pg (ref 26.0–34.0)
MCHC: 32.3 g/dL (ref 30.0–36.0)
MCV: 86.1 fL (ref 78.0–100.0)
Platelets: 265 10*3/uL (ref 150–400)
RBC: 3.74 MIL/uL — AB (ref 4.22–5.81)
RDW: 15.4 % (ref 11.5–15.5)
WBC: 10.8 10*3/uL — AB (ref 4.0–10.5)

## 2016-08-22 LAB — MAGNESIUM: Magnesium: 2 mg/dL (ref 1.7–2.4)

## 2016-08-22 MED ORDER — METOPROLOL TARTRATE 5 MG/5ML IV SOLN
5.0000 mg | Freq: Four times a day (QID) | INTRAVENOUS | Status: DC
  Administered 2016-08-22 – 2016-08-23 (×4): 5 mg via INTRAVENOUS
  Filled 2016-08-22 (×4): qty 5

## 2016-08-22 MED ORDER — RISPERIDONE 0.5 MG PO TBDP
0.5000 mg | ORAL_TABLET | Freq: Every day | ORAL | Status: DC
Start: 2016-08-22 — End: 2016-08-24
  Administered 2016-08-22 – 2016-08-23 (×2): 0.5 mg via ORAL
  Filled 2016-08-22 (×4): qty 1

## 2016-08-22 MED ORDER — ACETAMINOPHEN 10 MG/ML IV SOLN
1000.0000 mg | Freq: Four times a day (QID) | INTRAVENOUS | Status: AC
  Administered 2016-08-22 – 2016-08-23 (×3): 1000 mg via INTRAVENOUS
  Filled 2016-08-22 (×4): qty 100

## 2016-08-22 MED ORDER — ACETAMINOPHEN 160 MG/5ML PO SOLN
650.0000 mg | Freq: Three times a day (TID) | ORAL | Status: DC
  Filled 2016-08-22 (×3): qty 20.3

## 2016-08-22 NOTE — Consult Note (Signed)
   [facility] CM Inpatient Consult   08/22/2016  TEODOR ANZURES [birth year] [id number]   Patient screened for [facility] services. Chart reviewed. Noted Palliative Medicine Team notes. Spoke with inpatient RNCM as well. Discussed with inpatient RNCM that [facility] not appropriate at this time.    Marthenia Rolling, MSN-Ed, RN,BSN [facility] Liaison [phone]

## 2016-08-22 NOTE — Progress Notes (Signed)
S:f/u hand hematoma, reviewed pictures last pm regarding some blister formation/ drainage. Pt non verbal, just shouting out "help me".  O:Blood pressure (!) 177/78, pulse 89, temperature 98.4 F (36.9 C), temperature source Oral, resp. rate 18, height 6' (1.829 m), weight 72.6 kg (160 lb 0.9 oz), SpO2 97 %.  Hand evaluated, significant swelling, some blister formation of dorsal hand, fingers, palm - although swelling less than Sunday !  ecchimosis up arm as expected as hematoma dissipating; dorsal skin, finger skin and palm tissue soft, not tense; finger tips pink, warm; no current concern for infection  A:L hand hematoma resolving, but with new blister formation - soft, without clear evidence of infection   P: would conservatively manage swelling, blisters with elevation padding and zeroform or ? Silvadene for opened blisters - appreciate wound care recommendations; I believe that OR hematoma evacuation with patients co-morbidities would be very risky and potentially add significant morbidity with potential skin compromise and further infection.

## 2016-08-22 NOTE — Progress Notes (Addendum)
PROGRESS NOTE  [name] [id number] DOB: 1921/03/09 DOA: 08/18/2016 PCP: Melinda Crutch, MD  Brief summary:  [name]  is a 82 y.o. male, With history of massive pulmonary embolism, on chronic anticoagulation with eliquis, dementia, urinary retention status post Foley catheter placement, dysphagia was brought to the hospital from home after home health RN noted patient to have cloudy urine in the Foley catheter. Patient has been confused over the past few days, he does have history of dementia but at baseline he is able to communicate with his daughter. He also has had poor by mouth intake over the past few days.   H/o dementia has 24/7home care, Recent h/o PE on eliquis, recent h/o urinary retention with foley, admitted with altered mental status, UTI. Started on Rocephin, urology consulted for left sided hydro, Patient later developed hand hematoma, off anticoagulation, hand surgeon consulted,   Overall seems slightly improved , able to talk one or two sentences on 11/7, but still not taking meds consistently  palliative for goal of care,   Daughter from [place] will come to the hospital on 11/8     HPI/Recap of past 24 hours:  Patient seems has slightly improved, he is able to talk and interact a little, sitter in room  Assessment/Plan: Active Problems:   Encephalopathy acute   Urinary tract infection without hematuria   Pressure injury of skin   DNR (do not resuscitate)   Palliative care by specialist   Dysphagia   uti with metabolic encephalopathy, urinary retention, indwelling foley placed a few weeks ago, family report cloudy dark urine since Monday 10/30 no fever, no leukocytosis, bun/cr 23/1.26 close to baseline, blood culture no growth so far Ct pel on 11/3 shoed mild hydrohephrosis with left kidney, with increasing dilatation of the left renal pelvis and left ureter.Enhancement of the urothelium of the renal pelvis and ureter could be inflammatory or  infectious. There is a tiny stone in the distal left ureter.  Urine culture from 10/30 + proteus sensitive to rocephin, he is started on rocephin since admission, repeat urine culture obtained from 11/3+ showed the same, abx narrowed to ancef, day 5 of abx treatment on 11/7, will need total of 7 days for complicated uti psa [id number], urology has ordered foley to be removed, so far patient abe to void, bladder scan on urinary retention,  Urology Dr Jeffie Pollock input appreciated.   ckd III, d/c cozaar, renal dosing meds, cr fluctuating but close to baseline  HTN; d/c cozaar due to ckd , on iv betablocker, titrate to achieve goal.  Recent bilateral PE received thrombolysis on 10/19,  Currently on room air,   Anticoagulation stopped due to left hand hematoma.  right rectus sheath hematoma ( frome recent hospitalization) and its sequela are decreasing.  Dementia: from home  Hematoma left hand: Heparin drip d/ced, hand x ray no fracture, us+ 6cm hematoma, hand elevation,. hand surgery Dr Lenon Curt consulted, input appreciated.  FTT: Palliative care consulted.  Code Status: DNR  Family Communication: patient , personal care giver in room and daughter over the phone daily  Disposition Plan: pending   Consultants:  Urology  Hand surgery  Palliative care  Procedures:  none  Antibiotics:  Rocephin from admission to 11/6  Ancef from 11/6   Objective: BP (!) 177/78 (BP Location: Right Arm)   Pulse 89   Temp 98.4 F (36.9 C) (Oral)   Resp 18   Ht 6' (1.829 m)   Wt 72.6 kg (160 lb 0.9  oz)   SpO2 97%   BMI 21.71 kg/m   Intake/Output Summary (Last 24 hours) at 08/22/16 1244 Last data filed at 08/22/16 1005  Gross per 24 hour  Intake                0 ml  Output                0 ml  Net                0 ml   Filed Weights   08/18/16 1800  Weight: 72.6 kg (160 lb 0.9 oz)    Exam:   General:  Demented, foely catheter removed, seems slightly more alert and interactive this  am  Cardiovascular: RRR  Respiratory: CTABL  Abdomen: Soft/ND/NT, positive BS  Musculoskeletal: No Edema  Neuro: demented  Data Reviewed: Basic Metabolic Panel:  Recent Labs Lab 08/18/16 1309 08/19/16 0519 08/20/16 0610 08/21/16 0638 08/22/16 0515  NA 140 143 141 140 140  K 4.0 3.8 3.9 3.6 3.8  CL 107 109 106 107 110  CO2 28 24 24 25  20*  GLUCOSE 99 90 95 102* 93  BUN 29* 23* 19 15 16   CREATININE 1.44* 1.26* 1.22 1.06 1.33*  CALCIUM 9.0 8.8* 8.9 8.6* 8.5*  MG  --   --  1.6* 1.7 2.0   Liver Function Tests:  Recent Labs Lab 08/18/16 1309 08/19/16 0519  AST 17 14*  ALT 17 15*  ALKPHOS 62 59  BILITOT 1.2 1.1  PROT 6.3* 5.6*  ALBUMIN 3.3* 3.1*    Recent Labs Lab 08/18/16 1309  LIPASE 28    Recent Labs Lab 08/18/16 1531  AMMONIA 15   CBC:  Recent Labs Lab 08/18/16 1309 08/19/16 0519 08/20/16 0610 08/21/16 0638 08/22/16 0515  WBC 8.4 8.1 8.8 9.5 10.8*  NEUTROABS 6.0  --   --   --   --   HGB 10.2* 10.4* 10.9* 10.0* 10.4*  HCT 32.0* 32.8* 34.7* 31.0* 32.2*  MCV 87.2 88.2 87.0 86.1 86.1  PLT 311 278 287 287 265   Cardiac Enzymes:   No results for input(s): CKTOTAL, CKMB, CKMBINDEX, TROPONINI in the last 168 hours. BNP (last 3 results)  Recent Labs  07/31/16 2107  BNP 199.1*    ProBNP (last 3 results) No results for input(s): PROBNP in the last 8760 hours.  CBG: No results for input(s): GLUCAP in the last 168 hours.  Recent Results (from the past 240 hour(s))  Urine culture     Status: Abnormal   Collection Time: 08/14/16  6:05 PM  Result Value Ref Range Status   Specimen Description URINE, CATHETERIZED  Final   Special Requests NONE  Final   Culture >=100,000 COLONIES/mL PROTEUS MIRABILIS (A)  Final   Report Status 08/17/2016 FINAL  Final   Organism ID, Bacteria PROTEUS MIRABILIS (A)  Final      Susceptibility   Proteus mirabilis - MIC*    AMPICILLIN <=2 SENSITIVE Sensitive     CEFAZOLIN <=4 SENSITIVE Sensitive      CEFTRIAXONE <=1 SENSITIVE Sensitive     CIPROFLOXACIN <=0.25 SENSITIVE Sensitive     GENTAMICIN <=1 SENSITIVE Sensitive     IMIPENEM 2 SENSITIVE Sensitive     NITROFURANTOIN 128 RESISTANT Resistant     TRIMETH/SULFA <=20 SENSITIVE Sensitive     AMPICILLIN/SULBACTAM <=2 SENSITIVE Sensitive     PIP/TAZO <=4 SENSITIVE Sensitive     * >=100,000 COLONIES/mL PROTEUS MIRABILIS  Urine culture     Status:  Abnormal   Collection Time: 08/18/16  1:48 PM  Result Value Ref Range Status   Specimen Description URINE, CATHETERIZED  Final   Special Requests NONE  Final   Culture >=100,000 COLONIES/mL PROTEUS MIRABILIS (A)  Final   Report Status 08/20/2016 FINAL  Final   Organism ID, Bacteria PROTEUS MIRABILIS (A)  Final      Susceptibility   Proteus mirabilis - MIC*    AMPICILLIN <=2 SENSITIVE Sensitive     CEFAZOLIN <=4 SENSITIVE Sensitive     CEFTRIAXONE <=1 SENSITIVE Sensitive     CIPROFLOXACIN <=0.25 SENSITIVE Sensitive     GENTAMICIN <=1 SENSITIVE Sensitive     IMIPENEM 2 SENSITIVE Sensitive     NITROFURANTOIN 128 RESISTANT Resistant     TRIMETH/SULFA <=20 SENSITIVE Sensitive     AMPICILLIN/SULBACTAM <=2 SENSITIVE Sensitive     PIP/TAZO <=4 SENSITIVE Sensitive     * >=100,000 COLONIES/mL PROTEUS MIRABILIS  Blood culture (routine x 2)     Status: None (Preliminary result)   Collection Time: 08/18/16  4:20 PM  Result Value Ref Range Status   Specimen Description BLOOD LEFT ANTECUBITAL  Final   Special Requests BOTTLES DRAWN AEROBIC AND ANAEROBIC 5 CC EA  Final   Culture   Final    NO GROWTH 3 DAYS Performed at [facility]    Report Status PENDING  Incomplete  Blood culture (routine x 2)     Status: None (Preliminary result)   Collection Time: 08/18/16  4:28 PM  Result Value Ref Range Status   Specimen Description BLOOD RIGHT HAND  Final   Special Requests BOTTLES DRAWN AEROBIC AND ANAEROBIC 5 CC EA  Final   Culture   Final    NO GROWTH 3 DAYS Performed at [facility]     Report Status PENDING  Incomplete     Studies: No results found.  Scheduled Meds: . acetaminophen  1,000 mg Intravenous Q6H  . acetaminophen (TYLENOL) oral liquid 160 mg/5 mL  650 mg Oral TID  .  ceFAZolin (ANCEF) IV  1 g Intravenous BID  . DULoxetine  60 mg Oral Q breakfast  . gabapentin  100 mg Oral TID  . LORazepam  0.5 mg Oral QHS  . memantine  28 mg Oral Q1200  . metoprolol  2.5 mg Intravenous Q6H  . risperiDONE  0.5 mg Oral Daily  . tamsulosin  0.4 mg Oral QPC breakfast    Continuous Infusions: . sodium chloride 75 mL/hr at 08/22/16 0033     Time spent: 12mins  Linea Calles MD, PhD  [facility] Pager [phone]. If 7PM-7AM, please contact night-coverage at [email or web address], password [facility] 08/22/2016, 12:44 PM  LOS: 4 days

## 2016-08-22 NOTE — Progress Notes (Signed)
Patient took a couple of bites of strawberry ice cream and then he refused it. He stated that he "felt sick".

## 2016-08-22 NOTE — Care Management Important Message (Signed)
Important Message  Patient Details  Name: LEEMON SHABANI MRN: [id number] Date of Birth: 20-Jun-1921   Medicare Important Message Given:  Yes    Camillo Flaming 08/22/2016, 9:11 AMImportant Message  Patient Details  Name: AMY USHER MRN: [id number] Date of Birth: 31-Oct-1920   Medicare Important Message Given:  Yes    Camillo Flaming 08/22/2016, 9:11 AM

## 2016-08-22 NOTE — Progress Notes (Signed)
Pt refused risperdal

## 2016-08-22 NOTE — Progress Notes (Signed)
Pt refused oral medications. We first tried pushing oral tylenol with syringe to back of mouth b/c he c/o pain in back. He spit the entire amount of liquid tylenol. He did eat 1/2 popsicle for breakfast. Notified MD. She said to try tylenol suppository. Administered tylenol suppository.

## 2016-08-22 NOTE — Consult Note (Signed)
[place] Nurse wound consult note Reason for Consult:Purpura and weeping of unknown etiology. Left arm and hand.  Left hand is weeping serous fluid. Wound type:Vasculitis Pressure Ulcer POA: No Measurement:entire hand and extending upwards to near elbow Wound bed: red, moist Drainage (amount, consistency, odor) serous drainage Periwound:edematous Dressing procedure/placement/frequency: management of this wound is slightly outside the scope of [facility] nursing practice, but I am able to offer conservative strategies for comfort care and containment of exudate.  I have provided nursing with guidance via the orders for placement of a silicone non-adherent dressing sheet over the hand where there are weeping areas in hopes that no dressing will stick and cause trauma.  We will top that with soft and absorbant ABD pads and secure with Kerlix roll gauze/tape.  No tape is to be applied to the skin.  I have suggested change frequency at twice daily and as needed for drainage strike-through as it also allows for observation; this can also be decreased to once daily when condition stabilizes. [place] nursing team will not follow, but will remain available to this patient, the nursing and medical teams.  Please re-consult if needed. Thanks, Maudie Flakes, MSN, RN, [place], [facility]  Pager# [phone]

## 2016-08-22 NOTE — Evaluation (Signed)
Clinical/Bedside Swallow Evaluation Patient Details  Name: [name] MRN: [id number] Date of Birth: [birth year]  Today's Date: 08/22/2016 Time: SLP Start Time (ACUTE ONLY): 1135 SLP Stop Time (ACUTE ONLY): 1200 SLP Time Calculation (min) (ACUTE ONLY): 25 min  Past Medical History:  Past Medical History:  Diagnosis Date  . Anxiety   . BPH (benign prostatic hypertrophy)   . Constipation, chronic   . Dementia    situational  . Diverticular hemorrhage 2014  . Frequency of urination   . Hemorrhoids    "lift chair helped"  . Hypertension   . Kidney stone on left side   . PE (pulmonary thromboembolism) ([place])   . Pneumonia    hx of age 28  . Seizures ([place])   . Skin cancer   . 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 BLADDER SURGERY  1980's  . COLONOSCOPY N/A 01/28/2013   Procedure: COLONOSCOPY;  Surgeon: Jerene Bears, MD;  Location: [facility];  Service: Gastroenterology;  Laterality: N/A;  . CYSTOSCOPY WITH RETROGRADE PYELOGRAM, URETEROSCOPY AND STENT PLACEMENT Left 03/09/2015   Procedure: CYSTOSCOPY/LEFT URETEROSCOPY/ RETROGRADE PYLEGRAM HOLMIUM LASER, STONE  BASKET/STENT PLACEMENT;  Surgeon: Festus Aloe, MD;  Location: [facility];  Service: Urology;  Laterality: Left;  . HEMORRHOID SURGERY  1980's  . SKIN CANCER EXCISION    . SUPRAPUBIC PROSTATECTOMY  1975   HPI:  82 y.o.malewho was brought to [facility] ED with hypotension, hypoxia, syncopal episode, concern for PE. PMH: transient global amnesia, dementia, anxiety, skin cancer, PNA, HTN   Assessment / Plan / Recommendation Clinical Impression  Pt with questional left facial asymmetry when he speaks.  SLP assisted pt with self feeding ice cream, peaches and soda via straw.  Pt with mildly slow oral transiting and oral residuals of icecream on right - which he cleared adequately.  Pt did not swallow peach and expectorated x2 after stating "I'm going to  get sick".  No signs of intolerance or aspiration with puree/thin po, even with larger boluses.  Recommend to initiate Dys 1 diet and thin liquids with careful monitoring of respiratory status during intake. Pt will likely need to have frequent rest breaks and smaller, more frequent offerings of food/drink. SLP to f/u for tolerance and possible advancement.    Aspiration Risk  Mild aspiration risk;Moderate aspiration risk    Diet Recommendation Dysphagia 1 (Puree);Thin liquid   Liquid Administration via: Straw Medication Administration: Whole meds with puree (? liquid) Supervision: Patient able to self feed;Full supervision/cueing for compensatory strategies Compensations: Minimize environmental distractions;Slow rate;Small sips/bites Postural Changes: Seated upright at 90 degrees;Remain upright for at least 30 minutes after po intake    Other  Recommendations Oral Care Recommendations: Oral care BID   Follow up Recommendations  (tba)      Frequency and Duration min 2x/week  2 weeks       Prognosis Prognosis for Safe Diet Advancement: Good      Swallow Study   General Date of Onset: 08/22/16 HPI: 82 y.o.malewho was brought to [facility] ED with hypotension, hypoxia, syncopal episode, concern for PE. PMH: transient global amnesia, dementia, anxiety, skin cancer, PNA, HTN Type of Study: Bedside Swallow Evaluation Diet Prior to this Study: Dysphagia 1 (puree);Thin liquids Temperature Spikes Noted: No Respiratory Status: Room air History of Recent Intubation: No Behavior/Cognition: Alert;Cooperative;Requires cueing;Distractible;Other (Comment) (follows directions intermittently) Oral Cavity Assessment: Within Functional Limits Oral Care Completed by SLP: No Oral Cavity -  Dentition: Poor condition Self-Feeding Abilities: Needs assist (pt can hold his own cup/spoon to feed himself) Patient Positioning: Upright in bed Baseline Vocal Quality: Normal Volitional Cough: Weak Volitional  Swallow: Unable to elicit    Oral/Motor/Sensory Function Overall Oral Motor/Sensory Function: Mild impairment (when verbalizing pt with mild facial asymmetry on left compared to right, slight lingual deviation to left upon protrusion) Facial ROM: Reduced left Facial Symmetry: Abnormal symmetry left Facial Strength: Reduced left Facial Sensation: Other (Comment) Lingual ROM: Other (Comment) (reduced) Lingual Symmetry: Within Functional Limits Lingual Strength:  (dnt) Lingual Sensation:  (dnt) Velum: Within Functional Limits   Ice Chips Ice chips: Not tested   Thin Liquid Thin Liquid: Impaired Presentation: Straw;Self Fed Oral Phase Impairments: Reduced lingual movement/coordination Pharyngeal  Phase Impairments: Multiple swallows    Nectar Thick Nectar Thick Liquid: Not tested   Honey Thick Honey Thick Liquid: Not tested   Puree Puree: Impaired Presentation: Self Fed;Spoon Oral Phase Impairments: Reduced lingual movement/coordination Oral Phase Functional Implications: Prolonged oral transit Pharyngeal Phase Impairments: Suspected delayed Swallow   Solid   GO   Solid: Impaired Presentation: Self Fed;Spoon Oral Phase Impairments: Reduced lingual movement/coordination;Impaired mastication Oral Phase Functional Implications: Prolonged oral transit;Impaired mastication Pharyngeal Phase Impairments: Suspected delayed Swallow Other Comments: pt spat out partially masticated peaches after he stated, I feel sick        Claudie Fisherman, [facility] SLP [phone]

## 2016-08-22 NOTE — Progress Notes (Signed)
Daily Progress Note   Patient Name: [name]       Date: 08/22/2016 DOB: 07-05-1921  Age: 82 y.o. MRN#: [id number] Attending Physician: [name], MD Primary Care Physician: Melinda Crutch, MD Admit Date: 08/18/2016  Reason for Consultation/Follow-up: Establishing goals of care and Psychosocial/spiritual support  Subjective: - continued conversation with his daughter by phone/ [name]  to discuss diagnosis, prognosis, [place], EOL wishes disposition and options.  I also spoke with [name] RN with [facility].  A detailed discussion was had today regarding advanced directives.  Concepts specific to code status, artifical feeding and hydration, continued IV antibiotics and rehospitalization was had.  The difference between a aggressive medical intervention path  and a palliative comfort care path for this patient at this time was had.  Values and goals of care important to patient and family were attempted to be elicited.  Concept of Hospice and Palliative Care were discussed  Natural trajectory and expectations at EOL were discussed.  Questions and concerns addressed.   Family encouraged to call with questions or concerns.  PMT will continue to support holistically.  Length of Stay: 4  Current Medications: Scheduled Meds:  . acetaminophen  1,000 mg Intravenous Q6H  . acetaminophen (TYLENOL) oral liquid 160 mg/5 mL  650 mg Oral TID  .  ceFAZolin (ANCEF) IV  1 g Intravenous BID  . DULoxetine  60 mg Oral Q breakfast  . gabapentin  100 mg Oral TID  . LORazepam  0.5 mg Oral QHS  . memantine  28 mg Oral Q1200  . metoprolol  5 mg Intravenous Q6H  . risperiDONE  0.5 mg Oral Daily  . tamsulosin  0.4 mg Oral QPC breakfast    Continuous Infusions: . sodium chloride 75 mL/hr at  08/22/16 1304    PRN Meds: acetaminophen **OR** acetaminophen, haloperidol lactate, LORazepam, ondansetron **OR** ondansetron (ZOFRAN) IV, RESOURCE THICKENUP CLEAR  Physical Exam  Constitutional: He appears cachectic. He appears ill.  HENT:  Mouth/Throat: Oropharynx is clear and moist.  Cardiovascular: Normal rate, regular rhythm and normal heart sounds.   Pulmonary/Chest: He has decreased breath sounds in the right lower field and the left lower field.  Musculoskeletal:  generalized weakness and atrophy  Neurological:  Confused, easily agitated and uncooperative  Skin: Skin is warm and dry.  Vital Signs: BP (!) 177/78 (BP Location: Right Arm)   Pulse 89   Temp 98.4 F (36.9 C) (Oral)   Resp 18   Ht 6' (1.829 m)   Wt 72.6 kg (160 lb 0.9 oz)   SpO2 97%   BMI 21.71 kg/m  SpO2: SpO2: 97 % O2 Device: O2 Device: Not Delivered O2 Flow Rate: O2 Flow Rate (L/min): 2 L/min  Intake/output summary:  Intake/Output Summary (Last 24 hours) at 08/22/16 1443 Last data filed at 08/22/16 1005  Gross per 24 hour  Intake                0 ml  Output                0 ml  Net                0 ml   LBM: Last BM Date: 08/19/16 Baseline Weight: Weight: 72.6 kg (160 lb 0.9 oz) Most recent weight: Weight: 72.6 kg (160 lb 0.9 oz)       Palliative Assessment/Data:  30% at best      Patient Active Problem List   Diagnosis Date Noted  . DNR (do not resuscitate) 08/21/2016  . Palliative care by specialist 08/21/2016  . Dysphagia 08/21/2016  . Urinary tract infection without hematuria 08/18/2016  . Pressure injury of skin 08/18/2016  . Acute massive pulmonary embolism ([place]) 08/03/2016  . Hypotension 08/02/2016  . SOB (shortness of breath)   . Acute respiratory failure with hypoxia ([place])   . Syncope   . Hypocalcemia   . Encephalopathy acute   . Septic shock ([place])   . Diverticulosis of colon with hemorrhage 01/28/2013  . Stenosis, anal canal 01/28/2013  . Lower GI bleed  01/26/2013  . HTN (hypertension) 01/26/2013  . Chronic constipation 01/26/2013    Palliative Care Assessment & Plan    Assessment: 82 y.o. male  admitted on 08/18/2016 with  history of massive pulmonary embolism/ 07/2016, on chronic anticoagulation with eliquis (family was given grim prognosis at that time), dementia, urinary retention status post Foley catheter placement, dysphagia was brought to the hospital from home after home health RN noted patient to have cloudy urine in the Foley catheter.  Patient with increased physical, functional and cognitive decline and transported thru the ED  UA was abnormal in the ED,with positive nitrite, 6-30 WBCs   Admitted,  empirically started on ceftriaxone for UTI.  Patient has 24 caregivers in the home. Currently patient is confused and disoriented, not taking POs, and unable to support himself nutritionally.  HPOA leaning toward a comfort approach.    Family is faced with advanced directive decisions and anticipatory care needs  Recommendations/Plan:  Continue current  treatment plan until daughter arrives from [place] tomorrow to "see with my own eyes"  Pureed diet as tolerated, with known risk of aspiration.  Calorie count    Code Status:    Code Status Orders        Start     Ordered   08/18/16 1733  Do not attempt resuscitation (DNR)  Continuous    Question Answer Comment  In the event of cardiac or respiratory ARREST Do not call a "code blue"   In the event of cardiac or respiratory ARREST Do not perform Intubation, CPR, defibrillation or ACLS   In the event of cardiac or respiratory ARREST Use medication by any route, position, wound care, and other measures to relive pain and suffering. May use oxygen,  suction and manual treatment of airway obstruction as needed for comfort.      08/18/16 1732    Code Status History    Date Active Date Inactive Code Status Order ID Comments User Context   08/02/2016  7:28 PM  08/09/2016  4:34 PM DNR [id number]  [name], PA-C ED   01/26/2013  8:38 AM 01/30/2013  5:12 PM Full Code [id number]  Orvan Falconer, MD Inpatient       Prognosis:   < 4 weeks, if patient continues with poor po intake and focus is comfort  Discharge Planning:  To Be Determined  Care plan was discussed with Dr Erlinda Hong and Dr Lenon Curt  Thank you for allowing the Palliative Medicine Team to assist in the care of this patient.   Time In: 0840 Time Out: 0915 Total Time 35 min Prolonged Time Billed  no       Greater than 50%  of this time was spent counseling and coordinating care related to the above assessment and plan.  Wadie Lessen, NP  Please contact Palliative Medicine Team phone at [phone] for questions and concerns.

## 2016-08-23 ENCOUNTER — Inpatient Hospital Stay (HOSPITAL_COMMUNITY): Payer: Medicare Other

## 2016-08-23 DIAGNOSIS — R1310 Dysphagia, unspecified: Secondary | ICD-10-CM

## 2016-08-23 DIAGNOSIS — R52 Pain, unspecified: Secondary | ICD-10-CM

## 2016-08-23 DIAGNOSIS — N390 Urinary tract infection, site not specified: Secondary | ICD-10-CM

## 2016-08-23 DIAGNOSIS — G9340 Encephalopathy, unspecified: Secondary | ICD-10-CM

## 2016-08-23 LAB — CBC
HEMATOCRIT: 30.7 % — AB (ref 39.0–52.0)
HEMOGLOBIN: 9.8 g/dL — AB (ref 13.0–17.0)
MCH: 27.4 pg (ref 26.0–34.0)
MCHC: 31.9 g/dL (ref 30.0–36.0)
MCV: 85.8 fL (ref 78.0–100.0)
Platelets: 248 10*3/uL (ref 150–400)
RBC: 3.58 MIL/uL — AB (ref 4.22–5.81)
RDW: 15.4 % (ref 11.5–15.5)
WBC: 9.6 10*3/uL (ref 4.0–10.5)

## 2016-08-23 LAB — CULTURE, BLOOD (ROUTINE X 2)
CULTURE: NO GROWTH
Culture: NO GROWTH

## 2016-08-23 LAB — BASIC METABOLIC PANEL
ANION GAP: 10 (ref 5–15)
BUN: 13 mg/dL (ref 6–20)
CALCIUM: 8.5 mg/dL — AB (ref 8.9–10.3)
CO2: 23 mmol/L (ref 22–32)
Chloride: 108 mmol/L (ref 101–111)
Creatinine, Ser: 0.97 mg/dL (ref 0.61–1.24)
GFR calc non Af Amer: >60 mL/min (ref >60)
Glucose, Bld: 100 mg/dL — ABNORMAL HIGH (ref 65–99)
POTASSIUM: 3.5 mmol/L (ref 3.5–5.1)
Sodium: 141 mmol/L (ref 135–145)

## 2016-08-23 LAB — HEMOGLOBIN AND HEMATOCRIT, BLOOD
HEMATOCRIT: 28.4 % — AB (ref 39.0–52.0)
Hemoglobin: 9.2 g/dL — ABNORMAL LOW (ref 13.0–17.0)

## 2016-08-23 MED ORDER — TRAMADOL HCL 50 MG PO TABS
50.0000 mg | ORAL_TABLET | Freq: Two times a day (BID) | ORAL | Status: DC | PRN
  Administered 2016-08-23: 50 mg via ORAL
  Filled 2016-08-23: qty 1

## 2016-08-23 MED ORDER — HALOPERIDOL LACTATE 5 MG/ML IJ SOLN: 5.0000 mg | Freq: Four times a day (QID) | INTRAMUSCULAR | Status: DC | PRN

## 2016-08-23 MED ORDER — ADULT MULTIVITAMIN LIQUID CH
15.0000 mL | Freq: Every day | ORAL | Status: DC
  Filled 2016-08-23: qty 15

## 2016-08-23 MED ORDER — ENSURE ENLIVE PO LIQD
237.0000 mL | Freq: Three times a day (TID) | ORAL | Status: DC
  Administered 2016-08-23 (×2): 237 mL via ORAL

## 2016-08-23 MED ORDER — HYDROMORPHONE HCL 1 MG/ML IJ SOLN
1.0000 mg | Freq: Four times a day (QID) | INTRAMUSCULAR | Status: DC
  Administered 2016-08-23 – 2016-08-24 (×4): 1 mg via INTRAVENOUS
  Filled 2016-08-23 (×4): qty 1

## 2016-08-23 MED ORDER — HALOPERIDOL LACTATE 5 MG/ML IJ SOLN: 5.0000 mg | INTRAMUSCULAR | Status: DC | PRN

## 2016-08-23 MED ORDER — HYDROMORPHONE HCL 1 MG/ML PO LIQD: 1.0000 mg | Freq: Four times a day (QID) | ORAL | Status: DC

## 2016-08-23 NOTE — Progress Notes (Signed)
Initial Nutrition Assessment  DOCUMENTATION CODES:   Not applicable  INTERVENTION:  - Will order Ensure Enlive TID, each supplement provides 350 kcal and 20 grams of protein; recommend using to provide oral medications.  - Will order Magic Cup TID with meals, each supplement provides 290 kcal and 9 grams of protein. - Will order daily liquid multivitamin.  - Tech/RN to assist with meals if needed. - Continue to encourage PO intakes of meals and supplements. - [name] will follow-up 11/9.  NUTRITION DIAGNOSIS:   Inadequate oral intake related to lethargy/confusion, poor appetite as evidenced by per patient/family report, meal completion < 25%.  GOAL:   Patient will meet greater than or equal to 90% of their needs  MONITOR:   PO intake, Supplement acceptance, Weight trends, Labs, Skin, I & O's  REASON FOR ASSESSMENT:   Consult Calorie Count  ASSESSMENT:   82 y.o. male, With history of massive pulmonary embolism, on chronic anticoagulation with eliquis, dementia, urinary retention status post Foley catheter placement, dysphagia was brought to the hospital from home after home health RN noted patient to have cloudy urine in the Foley catheter. Patient has been confused over the past few days, he does have history of dementia but at baseline he is able to communicate with his daughter. He also has had poor by mouth intake over the past few days. He had loose bowel movements yesterday.  Pt seen for Calorie Count order. BMI indicates normal weight. Diet advanced yesterday no meal tickets saved yet.  Per flow sheet, pt consumed a few bites of ice cream and a few sips of ginger ale yesterday around 1150 and 10% of a milkshake yesterday around 1300.  Breakfast tray on bedside table at time of [name] visit. Pt unable to provide much information but does state over and over that he is having abdominal pain and would like pain medication (RN alerted to this). Spoke with private caregiver, who was at  bedside. She reports pt has had 4 hospitalizations since 10/16 and that pt was previously eating very well, feeding himself, and walking with the assistance of a walker. She states that he has steadily been declining. This AM she was only able to encourage him to take 1-2 bites of pureed eggs and a few small bites of orange Magic Cup. She states that pt typically likes these items and coffee but he refused coffee this AM.   Pt's daughter is to arrive this afternoon; RDs will be unavailable after 11:30 AM today but will follow-up tomorrow and talk with family, if possible. Spoke with caregiver about Ensure and she states that pt has had this in the past and has liked it. Encouraged her to encourage pt to use Ensure to take medications.   Unable to perform physical assessment at this time d/t pt's discomfort but will attempt tomorrow. Per chart review, pt has lost 26 lbs (14% body weight) in the past 2 weeks which is significant for time frame; will continue to monitor weight trends as it appears CBW is copied from weight in May 2016. Unable to state malnutrition with current information but will re-assess tomorrow.  Medications reviewed; PRN Zofran. Labs reviewed; Ca: 8.5 mg/dL.  IVF: NS @ 75 mL/hr.     Diet Order:  DIET - DYS 1 Room service appropriate? Yes; Fluid consistency: Thin  Skin:  Wound (see comment) (Stage 2 sacral pressure injury)  Last BM:  11/4  Height:   Ht Readings from Last 1 Encounters:  08/18/16 6' (  1.829 m)    Weight:   Wt Readings from Last 1 Encounters:  08/18/16 160 lb 0.9 oz (72.6 kg)    Ideal Body Weight:  80.91 kg  BMI:  Body mass index is 21.71 kg/m.  Estimated Nutritional Needs:   Kcal:  1450-1670 (20-23 kcal/kg)  Protein:  73-87 grams (1-1.2 grams/kg)  Fluid:  >/= 1.7 L/day  EDUCATION NEEDS:   No education needs identified at this time    Edward Matin, [name], [name], [name] Inpatient Clinical Dietitian Pager # [phone] After hours/weekend  pager # [phone]

## 2016-08-23 NOTE — Progress Notes (Signed)
Daily Progress Note   Patient Name: [name]       Date: 08/23/2016 DOB: [birth year]  Age: 82 y.o. MRN#: [id number] Attending Physician: Hosie Poisson, MD Primary Care Physician: Melinda Crutch, MD Admit Date: 08/18/2016  Reason for Consultation/Follow-up: Establishing goals of care and Psychosocial/spiritual support  Subjective: - continued conversation with his daughter  [name] and her husband and daughter  to discuss diagnosis, prognosis, GOC, EOL wishes disposition and options.   [name] RN with [facility] present for meeting  A detailed discussion was had today regarding advanced directives.  Concepts specific to code status, artifical feeding and hydration, continued IV antibiotics and rehospitalization was had.  The difference between a aggressive medical intervention path  and a palliative comfort care path for this patient at this time was had.  Values and goals of care important to patient and family were attempted to be elicited.  Concept of Hospice and Palliative Care were discussed  Natural trajectory and expectations at EOL were discussed.  Questions and concerns addressed.   Family encouraged to call with questions or concerns.  PMT will continue to support holistically.   Family has made decision to focus on comfort  Length of Stay: 5  Current Medications: Scheduled Meds:  . acetaminophen (TYLENOL) oral liquid 160 mg/5 mL  650 mg Oral TID  .  ceFAZolin (ANCEF) IV  1 g Intravenous BID  . feeding supplement (ENSURE ENLIVE)  237 mL Oral TID BM  .  HYDROmorphone (DILAUDID) injection  1 mg Intravenous Q6H  . LORazepam  0.5 mg Oral QHS  . risperiDONE  0.5 mg Oral Daily    Continuous Infusions: . sodium chloride 75 mL/hr at 08/23/16 1619    PRN  Meds: acetaminophen **OR** acetaminophen, haloperidol lactate, LORazepam, ondansetron **OR** ondansetron (ZOFRAN) IV, RESOURCE THICKENUP CLEAR  Physical Exam  Constitutional: He appears cachectic. He appears ill.  HENT:  Mouth/Throat: Oropharynx is clear and moist.  Cardiovascular: Normal rate, regular rhythm and normal heart sounds.   Pulmonary/Chest: He has decreased breath sounds in the right lower field and the left lower field.  Musculoskeletal:  generalized weakness and atrophy  Neurological:  Confused, easily agitated and uncooperative  Skin: Skin is warm and dry.            Vital Signs: BP (!) 167/84 (  BP Location: Left Arm)   Pulse 73   Temp 98.3 F (36.8 C) (Axillary)   Resp 18   Ht 6' (1.829 m)   Wt 72.6 kg (160 lb 0.9 oz)   SpO2 98%   BMI 21.71 kg/m  SpO2: SpO2: 98 % O2 Device: O2 Device: Not Delivered O2 Flow Rate: O2 Flow Rate (L/min): 2 L/min  Intake/output summary:   Intake/Output Summary (Last 24 hours) at 08/23/16 1714 Last data filed at 08/23/16 1619  Gross per 24 hour  Intake           868.75 ml  Output                0 ml  Net           868.75 ml   LBM: Last BM Date: 08/19/16 Baseline Weight: Weight: 72.6 kg (160 lb 0.9 oz) Most recent weight: Weight: 72.6 kg (160 lb 0.9 oz)       Palliative Assessment/Data:  20% at best      Patient Active Problem List   Diagnosis Date Noted  . DNR (do not resuscitate) 08/21/2016  . Palliative care by specialist 08/21/2016  . Dysphagia 08/21/2016  . Urinary tract infection without hematuria 08/18/2016  . Pressure injury of skin 08/18/2016  . Acute massive pulmonary embolism ([place]) 08/03/2016  . Hypotension 08/02/2016  . SOB (shortness of breath)   . Acute respiratory failure with hypoxia ([place])   . Syncope   . Hypocalcemia   . Encephalopathy acute   . Septic shock ([place])   . Diverticulosis of colon with hemorrhage 01/28/2013  . Stenosis, anal canal 01/28/2013  . Lower GI bleed 01/26/2013  . HTN  (hypertension) 01/26/2013  . Chronic constipation 01/26/2013    Palliative Care Assessment & Plan    Assessment: 82 y.o. male  admitted on 08/18/2016 with  history of massive pulmonary embolism/ 07/2016, on chronic anticoagulation with eliquis (family was given grim prognosis at that time), dementia, urinary retention status post Foley catheter placement, dysphagia was brought to the hospital from home after home health RN noted patient to have cloudy urine in the Foley catheter.  Patient with increased physical, functional and cognitive decline and transported thru the ED  UA was abnormal in the ED,with positive nitrite, 6-30 WBCs   Admitted,  empirically started on ceftriaxone for UTI.  Patient has 24 caregivers in the home. Currently patient is confused and disoriented, not taking POs, and unable to support himself nutritionally.  HPOA leaning toward a comfort approach.   Overall failure to thrive, prognosis is likely days to weeks.  Focus of care has shifted to comfort  Family is able to express their love for the patient and share in memorable life review.  They "know" comfort is the right appraoch at this time.   Recommendations/Plan:    Pureed diet as tolerated, with known risk of aspiration.   Focus is comfort and dignity  Minimize medications, maximize comfort  ---scheduled Dilaudid for underlying pain, haldol for agitation  No  further diagnositics   Code Status:    Code Status Orders        Start     Ordered   08/18/16 1733  Do not attempt resuscitation (DNR)  Continuous    Question Answer Comment  In the event of cardiac or respiratory ARREST Do not call a "code blue"   In the event of cardiac or respiratory ARREST Do not perform Intubation, CPR, defibrillation or ACLS   In the  event of cardiac or respiratory ARREST Use medication by any route, position, wound care, and other measures to relive pain and suffering. May use oxygen, suction and manual  treatment of airway obstruction as needed for comfort.      08/18/16 1732    Code Status History    Date Active Date Inactive Code Status Order ID Comments User Context   08/02/2016  7:28 PM 08/09/2016  4:34 PM DNR [id number]  [name], PA-C ED   01/26/2013  8:38 AM 01/30/2013  5:12 PM Full Code [id number]  Orvan Falconer, MD Inpatient       Prognosis:  -days to weeks; very little po intake, untreated PE, focus is comfort and digntiy  Discharge Planning:  Hopeful for residential hospice, will write for choice  Paged  Dr Karleen Hampshire  Thank you for allowing the Palliative Medicine Team to assist in the care of this patient.   Time In: 1650 Time Out: 1735 Total Time 45 min Prolonged Time Billed  no       Greater than 50%  of this time was spent counseling and coordinating care related to the above assessment and plan.  Wadie Lessen, NP  Please contact Palliative Medicine Team phone at [phone] for questions and concerns.

## 2016-08-23 NOTE — Progress Notes (Signed)
PROGRESS NOTE  [name] [id number] DOB: [birth year] DOA: 08/18/2016 PCP: Melinda Crutch, MD  Brief summary:  [name]  is a 82 y.o. male, With history of massive pulmonary embolism, on chronic anticoagulation with eliquis, dementia, urinary retention status post Foley catheter placement, dysphagia was brought to the hospital from home after home health RN noted patient to have cloudy urine in the Foley catheter. Patient has been confused over the past few days, he does have history of dementia but at baseline he is able to communicate with his daughter. He also has had poor by mouth intake over the past few days.   H/o dementia has 24/7home care, Recent h/o PE on eliquis, recent h/o urinary retention with foley, admitted with altered mental status, UTI. Started on Rocephin, urology consulted for left sided hydro, Patient later developed hand hematoma, off anticoagulation, hand surgeon consulted,   Overall slightly improved, though he is alert and able to answer questions, his appetite is not good, decreased po intake, refused some oral meds yesterday.  Started to get agitated , resumed his risperidol, increased to BID.   palliative for goal of care, meeting today with daughter.    Subjec  Assessment/Plan: Active Problems:   Encephalopathy acute   Urinary tract infection without hematuria   Pressure injury of skin   DNR (do not resuscitate)   Palliative care by specialist   Dysphagia   UTI with metabolic encephalopathy, urinary retention, indwelling foley placed a few weeks ago, family report cloudy dark urine since Monday 10/30. Blood Cultures have been negative so far.  Ct pelvis on 11/3 showed mild hydrohephrosis with left kidney, with increasing dilatation of the left renal pelvis and left ureter. Enhancement of the urothelium of the renal pelvis and ureter could be inflammatory or infectious. There is a tiny stone in the distal left ureter.  Urine culture from 10/30 +  proteus sensitive to rocephin, he is started on rocephin since admission, repeat urine culture obtained from 11/3+ showed the same, abx narrowed to ancef, plan for another 2 days of antibiotics to complte the course.  psa 25.75, urology has ordered foley to be removed, so far patient abe to void,  Urology Dr Jeffie Pollock input appreciated.   Stage 3 CKD: Creatinine at baseline.  Stop all nephrotoxic medications.    Hypertension: Controlled.  Prn Hydralazine.   Recent bilateral PE received thrombolysis on 10/19,  Currently on room air,   Anticoagulation stopped due to left hand hematoma.  Right rectus sheath hematoma ( frome recent hospitalization) and its sequela are decreasing.  Dementia: from home, with 24 hour home care.   Hematoma left hand: Heparin drip d/ced, hand x ray no fracture, us+ 6cm hematoma, hand elevation,. hand surgery Dr Lenon Curt consulted, input appreciated.  FTT: Palliative care consulted. Further recommendations to follow.   Code Status: DNR  Family Communication: patient , personal care giver in room , no family at bedside.   Disposition Plan: pending palliative care meeting.    Consultants:  Urology  Hand surgery  Palliative care  Procedures:  none  Antibiotics:  Rocephin from admission to 11/6  Ancef from 11/6   Objective: BP (!) 167/84 (BP Location: Left Arm)   Pulse 73   Temp 98.3 F (36.8 C) (Axillary)   Resp 18   Ht 6' (1.829 m)   Wt 72.6 kg (160 lb 0.9 oz)   SpO2 98%   BMI 21.71 kg/m   Intake/Output Summary (Last 24 hours) at 08/23/16 1621 Last  data filed at 08/23/16 1619  Gross per 24 hour  Intake           868.75 ml  Output                0 ml  Net           868.75 ml   Filed Weights   08/18/16 1800  Weight: 72.6 kg (160 lb 0.9 oz)    Exam:   General:  Alert , comfortable, able to answer simple questions.   Cardiovascular: RRR  Respiratory: CTABL  Abdomen: Soft/ND/NT, positive BS  Musculoskeletal: left lower  extremity hematoma,   Neuro: alert and confused.   Data Reviewed: Basic Metabolic Panel:  Recent Labs Lab 08/19/16 0519 08/20/16 0610 08/21/16 0638 08/22/16 0515 08/23/16 0516  NA 143 141 140 140 141  K 3.8 3.9 3.6 3.8 3.5  CL 109 106 107 110 108  CO2 24 24 25  20* 23  GLUCOSE 90 95 102* 93 100*  BUN 23* 19 15 16 13   CREATININE 1.26* 1.22 1.06 1.33* 0.97  CALCIUM 8.8* 8.9 8.6* 8.5* 8.5*  MG  --  1.6* 1.7 2.0  --    Liver Function Tests:  Recent Labs Lab 08/18/16 1309 08/19/16 0519  AST 17 14*  ALT 17 15*  ALKPHOS 62 59  BILITOT 1.2 1.1  PROT 6.3* 5.6*  ALBUMIN 3.3* 3.1*    Recent Labs Lab 08/18/16 1309  LIPASE 28    Recent Labs Lab 08/18/16 1531  AMMONIA 15   CBC:  Recent Labs Lab 08/18/16 1309 08/19/16 0519 08/20/16 0610 08/21/16 0638 08/22/16 0515 08/23/16 0516  WBC 8.4 8.1 8.8 9.5 10.8* 9.6  NEUTROABS 6.0  --   --   --   --   --   HGB 10.2* 10.4* 10.9* 10.0* 10.4* 9.8*  HCT 32.0* 32.8* 34.7* 31.0* 32.2* 30.7*  MCV 87.2 88.2 87.0 86.1 86.1 85.8  PLT 311 278 287 287 265 248   Cardiac Enzymes:   No results for input(s): CKTOTAL, CKMB, CKMBINDEX, TROPONINI in the last 168 hours. BNP (last 3 results)  Recent Labs  07/31/16 2107  BNP 199.1*    ProBNP (last 3 results) No results for input(s): PROBNP in the last 8760 hours.  CBG: No results for input(s): GLUCAP in the last 168 hours.  Recent Results (from the past 240 hour(s))  Urine culture     Status: Abnormal   Collection Time: 08/14/16  6:05 PM  Result Value Ref Range Status   Specimen Description URINE, CATHETERIZED  Final   Special Requests NONE  Final   Culture >=100,000 COLONIES/mL PROTEUS MIRABILIS (A)  Final   Report Status 08/17/2016 FINAL  Final   Organism ID, Bacteria PROTEUS MIRABILIS (A)  Final      Susceptibility   Proteus mirabilis - MIC*    AMPICILLIN <=2 SENSITIVE Sensitive     CEFAZOLIN <=4 SENSITIVE Sensitive     CEFTRIAXONE <=1 SENSITIVE Sensitive      CIPROFLOXACIN <=0.25 SENSITIVE Sensitive     GENTAMICIN <=1 SENSITIVE Sensitive     IMIPENEM 2 SENSITIVE Sensitive     NITROFURANTOIN 128 RESISTANT Resistant     TRIMETH/SULFA <=20 SENSITIVE Sensitive     AMPICILLIN/SULBACTAM <=2 SENSITIVE Sensitive     PIP/TAZO <=4 SENSITIVE Sensitive     * >=100,000 COLONIES/mL PROTEUS MIRABILIS  Urine culture     Status: Abnormal   Collection Time: 08/18/16  1:48 PM  Result Value Ref Range Status   Specimen  Description URINE, CATHETERIZED  Final   Special Requests NONE  Final   Culture >=100,000 COLONIES/mL PROTEUS MIRABILIS (A)  Final   Report Status 08/20/2016 FINAL  Final   Organism ID, Bacteria PROTEUS MIRABILIS (A)  Final      Susceptibility   Proteus mirabilis - MIC*    AMPICILLIN <=2 SENSITIVE Sensitive     CEFAZOLIN <=4 SENSITIVE Sensitive     CEFTRIAXONE <=1 SENSITIVE Sensitive     CIPROFLOXACIN <=0.25 SENSITIVE Sensitive     GENTAMICIN <=1 SENSITIVE Sensitive     IMIPENEM 2 SENSITIVE Sensitive     NITROFURANTOIN 128 RESISTANT Resistant     TRIMETH/SULFA <=20 SENSITIVE Sensitive     AMPICILLIN/SULBACTAM <=2 SENSITIVE Sensitive     PIP/TAZO <=4 SENSITIVE Sensitive     * >=100,000 COLONIES/mL PROTEUS MIRABILIS  Blood culture (routine x 2)     Status: None   Collection Time: 08/18/16  4:20 PM  Result Value Ref Range Status   Specimen Description BLOOD LEFT ANTECUBITAL  Final   Special Requests BOTTLES DRAWN AEROBIC AND ANAEROBIC 5 CC EA  Final   Culture   Final    NO GROWTH 5 DAYS Performed at [facility]    Report Status 08/23/2016 FINAL  Final  Blood culture (routine x 2)     Status: None   Collection Time: 08/18/16  4:28 PM  Result Value Ref Range Status   Specimen Description BLOOD RIGHT HAND  Final   Special Requests BOTTLES DRAWN AEROBIC AND ANAEROBIC 5 CC EA  Final   Culture   Final    NO GROWTH 5 DAYS Performed at [facility]    Report Status 08/23/2016 FINAL  Final     Studies: Dg Abd 1  View  Result Date: 08/23/2016 CLINICAL DATA:  26 pound weight loss over the past 2 weeks associated with constipation. EXAM: ABDOMEN - 1 VIEW COMPARISON:  KUB of February 01, 2015 FINDINGS: The colonic stool burden is moderate. No small or large bowel obstructive pattern is observed. The lung bases are clear. There is mild degenerative change of the lower lumbar spine. No calcified urinary tract stones are observed. There are calcifications in the wall of the abdominal aorta. IMPRESSION: No acute intra-abdominal abnormality is observed. The colonic stool burden has decreased since the previous study but remains moderate. Abdominal aortic atherosclerosis. Electronically Signed   By: David  Martinique M.D.   On: 08/23/2016 13:46    Scheduled Meds: . acetaminophen (TYLENOL) oral liquid 160 mg/5 mL  650 mg Oral TID  .  ceFAZolin (ANCEF) IV  1 g Intravenous BID  . DULoxetine  60 mg Oral Q breakfast  . feeding supplement (ENSURE ENLIVE)  237 mL Oral TID BM  . gabapentin  100 mg Oral TID  . LORazepam  0.5 mg Oral QHS  . memantine  28 mg Oral Q1200  . metoprolol  5 mg Intravenous Q6H  . multivitamin  15 mL Oral Daily  . risperiDONE  0.5 mg Oral Daily  . tamsulosin  0.4 mg Oral QPC breakfast    Continuous Infusions: . sodium chloride 75 mL/hr at 08/23/16 1619     Time spent: 34mins  Aysen Shieh MD, [facility] Pager [phone]. If 7PM-7AM, please contact night-coverage at [email or web address], password [facility] 08/23/2016, 4:21 PM  LOS: 5 days

## 2016-08-24 DIAGNOSIS — Z66 Do not resuscitate: Secondary | ICD-10-CM

## 2016-08-24 DIAGNOSIS — Z515 Encounter for palliative care: Secondary | ICD-10-CM

## 2016-08-24 MED ORDER — CEFAZOLIN IN D5W 1 GM/50ML IV SOLN: 1.0000 g | Freq: Two times a day (BID) | INTRAVENOUS | 0 refills | Status: AC

## 2016-08-24 MED ORDER — RISPERIDONE 0.5 MG PO TBDP
0.5000 mg | ORAL_TABLET | Freq: Every day | ORAL | Status: DC
  Filled 2016-08-24: qty 1

## 2016-08-24 MED ORDER — HYDROMORPHONE HCL 1 MG/ML IJ SOLN: 1.0000 mg | Freq: Four times a day (QID) | INTRAMUSCULAR | 0 refills | Status: AC

## 2016-08-24 MED ORDER — CEFPODOXIME PROXETIL 100 MG/5ML PO SUSR: 200.0000 mg | Freq: Two times a day (BID) | ORAL | 0 refills | Status: DC

## 2016-08-24 MED ORDER — RISPERIDONE 0.5 MG PO TBDP: 0.5000 mg | ORAL_TABLET | Freq: Two times a day (BID) | ORAL | 0 refills | Status: AC

## 2016-08-24 MED ORDER — LORAZEPAM 2 MG/ML IJ SOLN: 0.5000 mg | Freq: Three times a day (TID) | INTRAMUSCULAR | 0 refills | Status: AC | PRN

## 2016-08-24 NOTE — Progress Notes (Signed)
[facility] transporting pt to [facility]. Family aware.  Edward Coudriet W Ruthellen Tippy, RN

## 2016-08-24 NOTE — Progress Notes (Signed)
Report called to [facility]. Report called to [facility], RN. Family is aware of pt being transported. RN was notified that the facility does not accept IV ABX. MD was notified. No questions or concerns at this time. Gen Clagg W Lennis Korb, RN

## 2016-08-24 NOTE — Clinical Social Work Note (Addendum)
Clinical Social Work Assessment  Patient Details  Name: [name] MRN: [id number] Date of Birth: 1920/12/28  Date of referral:  08/24/16               Reason for consult:  End of Life/Hospice                Permission sought to share information with:  Family Supports, Customer service manager Permission granted to share information::  Yes, Verbal Permission Granted  Name::        Agency::     Relationship::   Daughter   Contact Information:   [name] [phone]  Housing/Transportation Living arrangements for the past 2 months:  [place] of Information:  Adult Children, Other (Comment Required) Ambulance person) Patient Interpreter Needed:  None Criminal Activity/Legal Involvement Pertinent to Current Situation/Hospitalization:    Significant Relationships:  Adult Children Lives with:  Self, Other (Comment) (24 hour Caretaker) Do you feel safe going back to the place where you live?  No Need for family participation in patient care:  Yes (Comment)  Care giving concerns:  [facility] will [facility].    Social Worker assessment / plan:  LCSWA met with patient daughter and granddaughter, explained reason for consult and role to assist family with pt. Transition to [place] [facility]. LCSWA contacted liaison Harmon Pier and verified family decision.  Provided emotional support when needed.  Faxed updated d/c summary Assist with transport by [facility].   Employment status:  Retired Forensic scientist:  Medicare PT Recommendations:  Not assessed at this time [place] / Referral to community resources:  Other (Comment Required) Dance movement psychotherapist)  Patient/Family's Response to care:  Agreeable   Patient/Family's Understanding of and Emotional Response to Diagnosis, Current Treatment, and Prognosis:  Family understand patient needs comfort at this time.   Emotional Assessment Appearance:  Appears stated age Attitude/Demeanor/Rapport:    Affect (typically  observed):  Calm Orientation:  Oriented to Self Alcohol / Substance use:  Not Applicable Psych involvement (Current and /or in the community):  No (Comment)  Discharge Needs  Concerns to be addressed:  Discharge Planning Concerns Readmission within the last 30 days:  Yes Current discharge risk:  None Barriers to Discharge:  No Barriers Identified   Lia Hopping, LCSW 08/24/2016, 12:26 PM

## 2016-08-24 NOTE — Progress Notes (Signed)
Nutrition Brief Note  Reviewed chart and discussed with RN. Patient now transitioning to comfort care. Plan to discharge to residential hospice placement.  No further nutrition interventions warranted at this time. Can continue comfort feedings as desired by patient. Per Palliative Care note - pureed diet as tolerated, with known risk of aspiration. Will leave Ensure as ordered for patient to drink as desired. Discontinued calorie count.  Please consult Dietitian as needed.   Willey Blade, MS, RD, LDN Pager: [phone] After Hours Pager: [phone]

## 2016-08-24 NOTE — Discharge Summary (Addendum)
Physician Discharge Summary  [name] [id number] DOB: 1921/08/18 DOA: 08/18/2016  PCP: Melinda Crutch, MD  Admit date: 08/18/2016 Discharge date: 08/24/2016  Admitted From: Home Disposition:  (Residential Hospice)  Recommendations for Outpatient Follow-up:  1. Follow up with hospice MD as needed.     Discharge Condition:hospice. CODE STATUS:, Comfort Care) Diet recommendation: pureed, comfort feeds.   Brief/Interim Summary: JohnSparksis a 82 y.o.male,With history of massive pulmonary embolism, on chronic anticoagulation with eliquis, dementia, urinary retention status post Foley catheter placement, dysphagia was brought to the hospital from home after home health RN noted patient to have cloudy urine in the Foley catheter.Patient has been confused over the past few days, he does have history of dementia but at baseline he is able to communicate with his daughter. He also has had poor by mouth intake over the past few days.   H/o dementia has 24/7home care, Recent h/o PE on eliquis, recent h/o urinary retention with foley, admitted with altered mental status, UTI. Started on Rocephin, urology consulted for left sided hydronephrosis, Patient later developed hand hematoma, off anticoagulation, hand surgeon consulted, .  Over the course of the hospitalization, patient deteriorated, and palliative care consulted and family has decided for comfort care and we are planning for residential hospice placement once bed is available.    Discharge Diagnoses:  Active Problems:   Encephalopathy acute   Urinary tract infection without hematuria   Pressure injury of skin   DNR (do not resuscitate)   Palliative care by specialist   Dysphagia   Pain, generalized  UTI with metabolic encephalopathy, urinary retention, indwelling foley placed a few weeks ago, family report cloudy dark urine since Monday 10/30. Blood Cultures have been negative so far.  Ct pelvis on 11/3 showed mild  hydrohephrosis with left kidney, with increasing dilatation of the left renal pelvis and left ureter. Enhancement of the urothelium of the renal pelvis and ureter could be inflammatory or infectious. There is a tiny stone in the distal left ureter.  Urine culture from 10/30 + proteus sensitive to rocephin, he is started on rocephin since admission, repeat urine culture obtained from 11/3+ showed the same, abx narrowed to ancef, completed 7 days by today, will order another 3 days of IV antibiotics.  Urology Dr Jeffie Pollock consulted and  input appreciated.   Stage 3 CKD: Creatinine at baseline.  Stop all nephrotoxic medications.    Hypertension: Controlled.    Recent bilateral PE received thrombolysis on 10/19,  Currently on room air,   Anticoagulation stopped due to left hand hematoma.  Right rectus sheath hematoma ( from recent hospitalization) and its sequela are decreasing.  Dementia: agitated, overnight, on Risperdal sub lingual.   Hematoma left hand: Heparin drip d/ced, hand x ray no fracture, us+ 6cm hematoma,. hand surgery Dr Lenon Curt consulted, , off anticoagulation and supportive treatment.   Discharge Instructions  Discharge Instructions    Discharge instructions    Complete by:  As directed    Please follow up with hospice MD as needed.       Medication List    STOP taking these medications   apixaban 5 MG Tabs tablet Commonly known as:  ELIQUIS   LORazepam 0.5 MG tablet Commonly known as:  ATIVAN Replaced by:  LORazepam 2 MG/ML injection   losartan 50 MG tablet Commonly known as:  COZAAR   risperiDONE 0.5 MG tablet Commonly known as:  RISPERDAL Replaced by:  risperiDONE 0.5 MG disintegrating tablet   tamsulosin 0.4 MG Caps capsule  Commonly known as:  FLOMAX     TAKE these medications   ceFAZolin 1 GM/50ML Commonly known as:  ANCEF Inject 50 mLs (1 g total) into the vein 2 (two) times daily.   DULoxetine 60 MG capsule Commonly known as:   CYMBALTA Take 60 mg by mouth daily with breakfast.   gabapentin 300 MG capsule Commonly known as:  NEURONTIN Take 300 mg by mouth at bedtime.   gabapentin 100 MG capsule Commonly known as:  NEURONTIN Take 100 mg by mouth 3 (three) times daily.   HYDROmorphone 1 MG/ML injection Commonly known as:  DILAUDID Inject 1 mL (1 mg total) into the vein every 6 (six) hours.   LORazepam 2 MG/ML injection Commonly known as:  ATIVAN Inject 0.25 mLs (0.5 mg total) into the vein 3 (three) times daily as needed for anxiety (agitation). Replaces:  LORazepam 0.5 MG tablet   memantine 28 MG Cp24 24 hr capsule Commonly known as:  NAMENDA XR Take 28 mg by mouth daily at 12 noon.   Cardwell Take 1 Container by mouth as needed.   risperiDONE 0.5 MG disintegrating tablet Commonly known as:  RISPERDAL M-TABS Place 1 tablet (0.5 mg total) under the tongue 2 (two) times daily. Replaces:  risperiDONE 0.5 MG tablet       Allergies  Allergen Reactions  . Amoxicillin Diarrhea  . Codeine Other (See Comments)  . Feldene [Piroxicam] Other (See Comments)  . Ibuprofen Other (See Comments)    H/o bleeding (per pt's daughter)  . Indocin [Indomethacin] Other (See Comments)  . Morphine And Related Nausea And Vomiting  . Other     omnicor- unknown reaction  . Uroxatral [Alfuzosin Hcl Er] Other (See Comments)    Consultations:  Palliative care.   Urology  Hand Surgery.    Procedures/Studies: Ct Abdomen Pelvis Wo Contrast  Result Date: 08/08/2016 CLINICAL DATA:  Generalized abdominal pain. EXAM: CT ABDOMEN AND PELVIS WITHOUT CONTRAST TECHNIQUE: Multidetector CT imaging of the abdomen and pelvis was performed following the standard protocol without IV contrast. COMPARISON:  CT scan of August 13, 2014. FINDINGS: Lower chest: Mild bilateral pleural effusions are noted with adjacent subsegmental atelectasis. Hepatobiliary: Gallbladder is contracted. No gallstones are noted. No  focal abnormality seen in the liver on these unenhanced images. Pancreas: Normal. Spleen: Splenic granulomata are noted.  Otherwise normal. Adrenals/Urinary Tract: Adrenal glands appear normal. Stable 3.2 cm exophytic cyst arising from upper pole left kidney. No hydronephrosis or renal obstruction is noted. Small nonobstructive right renal calculus is noted. Foley catheter is noted in urinary bladder. Urinary bladder is decompressed. However, it is thick walled suggesting inflammation or neoplasm. Possible hemorrhage within the urinary bladder cannot be excluded. Stomach/Bowel: There is no evidence of bowel obstruction. Sigmoid diverticulosis is noted without inflammation. Vascular/Lymphatic: Atherosclerosis of abdominal aorta is noted without aneurysm formation. No significant adenopathy is noted. Reproductive: Mild prostatic enlargement is noted Other: There is noted moderate size right-sided rectal sheath hematoma which extends into the right lateral abdominal wall musculature. Small amount of hemorrhage is seen in the anterior pelvis which most likely extends from this rectal sheath hematoma. Musculoskeletal: Severe degenerative disc disease is noted at L5-S1. IMPRESSION: Mild bilateral pleural effusions are noted with adjacent subsegmental atelectasis. Small nonobstructive right renal calculus. No hydronephrosis or renal obstruction is noted. Aortic atherosclerosis. Sigmoid diverticulosis without inflammation. Moderate size right-sided rectus sheath hematoma is noted which extends into the right lateral abdominal wall musculature, as well as slightly into the anterior portion  of the right pelvis. Urinary bladder is decompressed secondary to Foley catheter. However it is significantly thick walled suggesting inflammation or possibly neoplasm. The possibility of hemorrhage within the urinary bladder lumen cannot be excluded. These results will be called to the ordering clinician or representative by the  Radiologist Assistant, and communication documented in the PACS or zVision Dashboard. Electronically Signed   By: Marijo Conception, M.D.   On: 08/08/2016 07:29   Dg Chest 2 View  Result Date: 08/18/2016 CLINICAL DATA:  Altered mental status. History of dementia, hypertension and pneumonia. EXAM: CHEST  2 VIEW COMPARISON:  CT chest August 03, 2016 FINDINGS: The cardiac silhouette is moderately enlarged. Calcified aortic knob. Small to moderate layering pleural effusion seen on the lateral radiograph. Mild bronchitic changes. Granuloma projects in LEFT upper lobe. No pneumothorax. Osteopenia. Moderate degenerative change of the spine. IMPRESSION: Small to moderate layering pleural effusion. Mild bronchitic changes. Stable cardiomegaly. Electronically Signed   By: Elon Alas M.D.   On: 08/18/2016 15:20   Dg Chest 2 View  Result Date: 07/31/2016 CLINICAL DATA:  Cough for several days and fever EXAM: CHEST  2 VIEW COMPARISON:  07/28/2012 FINDINGS: There is mild cardiomegaly. There is atherosclerosis of the thoracic aorta. Calcified granuloma in the left upper lobe is stable. No pneumonic consolidation, CHF, or pneumothorax. Minimal blunting of the right costophrenic angle. Glenohumeral and AC joint osteoarthritis bilaterally. IMPRESSION: No active cardiopulmonary disease. Electronically Signed   By: Ashley Royalty M.D.   On: 07/31/2016 22:08   Dg Abd 1 View  Result Date: 08/23/2016 CLINICAL DATA:  26 pound weight loss over the past 2 weeks associated with constipation. EXAM: ABDOMEN - 1 VIEW COMPARISON:  KUB of February 01, 2015 FINDINGS: The colonic stool burden is moderate. No small or large bowel obstructive pattern is observed. The lung bases are clear. There is mild degenerative change of the lower lumbar spine. No calcified urinary tract stones are observed. There are calcifications in the wall of the abdominal aorta. IMPRESSION: No acute intra-abdominal abnormality is observed. The colonic stool  burden has decreased since the previous study but remains moderate. Abdominal aortic atherosclerosis. Electronically Signed   By: David  Martinique M.D.   On: 08/23/2016 13:46   Ct Head Wo Contrast  Result Date: 08/03/2016 CLINICAL DATA:  82 year old male with altered mental status EXAM: CT HEAD WITHOUT CONTRAST TECHNIQUE: Contiguous axial images were obtained from the base of the skull through the vertex without intravenous contrast. COMPARISON:  None. FINDINGS: Brain: There is mild to moderate age-related atrophy and chronic microvascular ischemic changes. There is no acute intracranial hemorrhage. No mass effect or midline shift noted. Vascular: No hyperdense vessel or unexpected calcification. Skull: Normal. Negative for fracture or focal lesion. Sinuses/Orbits: No acute finding. Other: None IMPRESSION: No acute intracranial hemorrhage. Age-related atrophy and chronic microvascular ischemic disease. If symptoms persist and there are no contraindications, MRI may provide better evaluation if clinically indicated. Electronically Signed   By: Anner Crete M.D.   On: 08/03/2016 06:52   Ct Angio Chest Pe W And/or Wo Contrast  Result Date: 08/03/2016 CLINICAL DATA:  There is no hilar or mediastinal adenopathy. The esophagus is grossly unremarkable. EXAM: CT ANGIOGRAPHY CHEST WITH CONTRAST TECHNIQUE: Multidetector CT imaging of the chest was performed using the standard protocol during bolus administration of intravenous contrast. Multiplanar CT image reconstructions and MIPs were obtained to evaluate the vascular anatomy. CONTRAST:  80 cc Isovue 370 COMPARISON:  Chest radiograph dated 08/02/2016 FINDINGS: Evaluation is  limited due to streak artifact caused by patient's arms. Cardiovascular: There is a moderate atherosclerotic calcification of the thoracic aorta. No aneurysmal dilatation or evidence of dissection. Large bilateral pulmonary artery emboli with involvement of the lobar branches of the right  upper and right lower lobe as well as segmental branches of the left lower lobe. There is mild cardiomegaly. The right ventricle measures 61 mm in greatest diameter and the left ventricle measures 39 mm in greatest diameter compatible with a degree of right cardiac straining. Mediastinum/Nodes: There is no hilar or mediastinal adenopathy. The esophagus is grossly unremarkable. Lungs/Pleura: Small bilateral pleural effusions with partial compressive atelectasis of the lower lobes. Superimposed pneumonia is less likely but not excluded. There is no pneumothorax. Upper Abdomen: There is retrograde flow of contrast from the right atrium into the IVC compatible with a degree of right cardiac dysfunction. Correlation with echocardiogram recommended. A 3.0 x 3.5 cm hypoattenuating lesion arising from the superior pole of the left kidney is not characterized on this CT but appears similar to the cyst seen on the CT dated 08/13/2014. Small amount of fluid is noted adjacent to the spleen. Evaluation of this fluid is limited due to streak artifact but this fluid appears hypo attenuating. Correlation with clinical exam recommended. Underlying splenic injury is not excluded. CT of the abdomen pelvis with better positioning of the patient to reduce streak artifact may provide better evaluation of the spleen and perisplenic fluid. Musculoskeletal: Osteopenia with multilevel degenerative changes of the spine. No acute fracture. Review of the MIP images confirms the above findings. IMPRESSION: Large bilateral pulmonary artery emboli with CT evidence of right heart strain (RV/LV Ratio = 1.6) consistent with at least submassive (intermediate risk) PE. The presence of right heart strain has been associated with an increased risk of morbidity and mortality. Please activate Code PE by paging [phone]. Small bilateral pleural effusions. Small amount of fluid adjacent to the spleen, incompletely characterized. Correlation with clinical  exam recommended. CT of the abdomen pelvis may provide better evaluation of the spleen and perisplenic fluid. These results were called by telephone at the time of interpretation on 08/03/2016 at 7:03 am to nurse Casper Harrison who verbally acknowledged these results. Electronically Signed   By: Anner Crete M.D.   On: 08/03/2016 07:05   Ct Abdomen Pelvis W Contrast  Result Date: 08/18/2016 CLINICAL DATA:  Cloudy dark urine since Monday. EXAM: CT ABDOMEN AND PELVIS WITH CONTRAST TECHNIQUE: Multidetector CT imaging of the abdomen and pelvis was performed using the standard protocol following bolus administration of intravenous contrast. CONTRAST:  71mL ISOVUE-300 IOPAMIDOL (ISOVUE-300) INJECTION 61% COMPARISON:  August 07, 2016 FINDINGS: Lower chest: Small bilateral pleural effusions identified. Coronary artery calcifications. No other lung base abnormalities. Hepatobiliary: There is a probable tiny cyst in the lower right hepatic lobe on series 2, image 33, too small to characterize. No suspicious hepatic masses. Limited views of the portal vein are normal. The gallbladder is unremarkable. Pancreas: Unremarkable. No pancreatic ductal dilatation or surrounding inflammatory changes. Spleen: Splenic calcified granulomas.  No other abnormalities. Adrenals/Urinary Tract: The adrenal glands are normal. There is a small neoplasm off the posterior lower pole of the right kidney on series 2, image 41, not significantly changed since 2015. There is an indeterminate exophytic lesion off the mid right kidney posteriorly on series 2, image 34. No suspicious solid masses are seen on the left. No hydronephrosis on the right. No right ureteral stones. There is mild hydronephrosis on the left and increasing prominence  of the left renal pelvis. There is increased attenuation in the fat around the left renal pelvis and enhancement of the urothelium of the left renal pelvis and ureter. The ureter is newly dilated and there is a tiny  punctate stone measuring 2 mm in the distal left ureter on series 2, image 77. The bladder is decompressed with a Foley catheter. There is a cyst off the upper pole left kidney. Stomach/Bowel: The stomach and small bowel are normal. The stool ball seen on the previous study has resolved. Fecal loading remains throughout the colon, decreased in the interval. A few scattered colonic diverticuli are seen without diverticulitis. The appendix is normal in appearance. Vascular/Lymphatic: Atherosclerotic change seen throughout the aorta and branching vessels. No aneurysm or dissection. No adenopathy. Reproductive: Prostate is unremarkable. Other: The right rectus sheath hematoma persists but is smaller in the interval. Stranding in blood products are seen in the right pelvis on series 2, image 72, also decreased. No free air or free fluid. Musculoskeletal: No bony changes. IMPRESSION: 1. Mild hydronephrosis associated with the left kidney with increasing dilatation of the left renal pelvis and left ureter. There is a tiny stone in the distal left ureter. Enhancement of the urothelium of the renal pelvis and ureter could be inflammatory or infectious. The bladder is now decompressed with a Foley catheter. 2. Probable renal cell carcinoma off the lower pole the right kidney unchanged since 2015. 1 or 2 other indeterminate lesions are associated with the right kidney. These findings are similar since 2015. 3. The right rectus sheath hematoma and its sequela are decreasing. 4. Atherosclerosis. Electronically Signed   By: Dorise Bullion III M.D   On: 08/18/2016 15:33   US Renal  Result Date: 08/19/2016 CLINICAL DATA:  Uterine tract infection. Hydronephrosis seen on CT. Distal left ureteric stone. EXAM: RENAL / URINARY TRACT ULTRASOUND COMPLETE COMPARISON:  CT of 1 day prior FINDINGS: Right Kidney: Length: 10.2 cm. Echogenicity within normal limits. No mass or hydronephrosis visualized. Left Kidney: Length: 10.6 cm. Normal  renal cortical thickness for age. Minimal caliectasis is suspected, similar. Bladder: Decompressed around a Foley catheter. IMPRESSION: Minimal left-sided caliectasis is felt to be similar. Electronically Signed   By: Abigail Miyamoto M.D.   On: 08/19/2016 23:05   [place] Extrem Up Left Ltd  Result Date: 08/20/2016 CLINICAL DATA:  Dorsal left hand soft tissue swelling. EXAM: ULTRASOUND LEFT UPPER EXTREMITY LIMITED TECHNIQUE: Ultrasound examination of the dorsal left hand soft tissues was performed in the area of clinical concern. COMPARISON:  None FINDINGS: A large mixed echogenicity fluid collection with predominant hyperechoic echotexture is seen in the area of clinical concern in the dorsal soft tissues of the hand. This measures 6.1 x 2.0 x 5.4 cm. No internal blood flow is seen within this collection, and there is no significant peripheral hyperemia on color Doppler ultrasound. This has features suspicious for hematoma, with abscess considered less likely. IMPRESSION: 6.1 cm fluid collection in the area of clinical concern in the dorsal left hand soft tissues. This has sonographic characteristics favoring hematoma over abscess. Electronically Signed   By: Earle Gell M.D.   On: 08/20/2016 18:30   Dg Chest Port 1 View  Result Date: 08/03/2016 CLINICAL DATA:  Respiratory failure and shortness of breath, known pulmonary emboli EXAM: PORTABLE CHEST 1 VIEW COMPARISON:  08/02/2016 FINDINGS: Cardiac shadow remains enlarged in size. Aortic calcifications are again noted. Mild interstitial changes are again seen. No focal confluent infiltrate is noted. No acute bony abnormality  is noted. IMPRESSION: Stable appearance of the chest with mild interstitial changes. No focal infiltrate is seen. Electronically Signed   By: Inez Catalina M.D.   On: 08/03/2016 07:48   Dg Chest Portable 1 View  Result Date: 08/02/2016 CLINICAL DATA:  Altered mental status. EXAM: PORTABLE CHEST 1 VIEW COMPARISON:  07/31/2016 FINDINGS: 1549  hours. The cardio pericardial silhouette is enlarged. The lungs are clear wiithout focal pneumonia, edema, pneumothorax or pleural effusion. Interstitial markings are diffusely coarsened with chronic features. Calcified granuloma left mid lung again noted. The visualized bony structures of the thorax are intact. Telemetry leads overlie the chest. IMPRESSION: Cardiomegaly with chronic interstitial changes. No acute cardiopulmonary findings. Electronically Signed   By: Misty Stanley M.D.   On: 08/02/2016 16:26   Dg Hand Complete Left  Result Date: 08/20/2016 CLINICAL DATA:  Left hand pain, swelling, and bruising. EXAM: LEFT HAND - COMPLETE 3+ VIEW COMPARISON:  None. FINDINGS: Suboptimal positioning due to patient's inability to cooperate and combativeness. No evidence of acute fracture or dislocation. Visualization of fingers is limited due to there partially flexed position. 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 Generalized osteopenia noted. No evidence of osteolysis or periostitis. Soft tissue swelling seen along the dorsal aspect of the metacarpals. IMPRESSION: Dorsal soft tissue swelling.  No acute osseous abnormality. Electronically Signed   By: Earle Gell M.D.   On: 08/20/2016 18:26      Subjective: Confused.   Discharge Exam: Vitals:   08/23/16 2016 08/24/16 0504  BP: 117/60 (!) 119/58  Pulse: (!) 59 72  Resp: 19 20  Temp: 97.7 F (36.5 C) 97.6 F (36.4 C)   Vitals:   08/23/16 0611 08/23/16 1417 08/23/16 2016 08/24/16 0504  BP: (!) 204/50 (!) 167/84 117/60 (!) 119/58  Pulse: 96 73 (!) 59 72  Resp: 18 18 19 20   Temp: 98.4 F (36.9 C) 98.3 F (36.8 C) 97.7 F (36.5 C) 97.6 F (36.4 C)  TempSrc: Axillary Axillary Axillary Axillary  SpO2: 99% 98% 94% 90%  Weight:      Height:        General: Pt is cachetic looking, ill,  Cardiovascular: RRR Respiratory: diminished breath sounds at bases.  Abdominal: Soft, mild gen  tenderness.  Extremities: no edema, no cyanosis    The results of significant diagnostics from this hospitalization (including imaging, microbiology, ancillary and laboratory) are listed below for reference.     Microbiology: Recent Results (from the past 240 hour(s))  Urine culture     Status: Abnormal   Collection Time: 08/14/16  6:05 PM  Result Value Ref Range Status   Specimen Description URINE, CATHETERIZED  Final   Special Requests NONE  Final   Culture >=100,000 COLONIES/mL PROTEUS MIRABILIS (A)  Final   Report Status 08/17/2016 FINAL  Final   Organism ID, Bacteria PROTEUS MIRABILIS (A)  Final      Susceptibility   Proteus mirabilis - MIC*    AMPICILLIN <=2 SENSITIVE Sensitive     CEFAZOLIN <=4 SENSITIVE Sensitive     CEFTRIAXONE <=1 SENSITIVE Sensitive     CIPROFLOXACIN <=0.25 SENSITIVE Sensitive     GENTAMICIN <=1 SENSITIVE Sensitive     IMIPENEM 2 SENSITIVE Sensitive     NITROFURANTOIN 128 RESISTANT Resistant     TRIMETH/SULFA <=20 SENSITIVE Sensitive     AMPICILLIN/SULBACTAM <=2 SENSITIVE Sensitive     PIP/TAZO <=4 SENSITIVE Sensitive     * >=100,000 COLONIES/mL PROTEUS MIRABILIS  Urine culture  Status: Abnormal   Collection Time: 08/18/16  1:48 PM  Result Value Ref Range Status   Specimen Description URINE, CATHETERIZED  Final   Special Requests NONE  Final   Culture >=100,000 COLONIES/mL PROTEUS MIRABILIS (A)  Final   Report Status 08/20/2016 FINAL  Final   Organism ID, Bacteria PROTEUS MIRABILIS (A)  Final      Susceptibility   Proteus mirabilis - MIC*    AMPICILLIN <=2 SENSITIVE Sensitive     CEFAZOLIN <=4 SENSITIVE Sensitive     CEFTRIAXONE <=1 SENSITIVE Sensitive     CIPROFLOXACIN <=0.25 SENSITIVE Sensitive     GENTAMICIN <=1 SENSITIVE Sensitive     IMIPENEM 2 SENSITIVE Sensitive     NITROFURANTOIN 128 RESISTANT Resistant     TRIMETH/SULFA <=20 SENSITIVE Sensitive     AMPICILLIN/SULBACTAM <=2 SENSITIVE Sensitive     PIP/TAZO <=4 SENSITIVE  Sensitive     * >=100,000 COLONIES/mL PROTEUS MIRABILIS  Blood culture (routine x 2)     Status: None   Collection Time: 08/18/16  4:20 PM  Result Value Ref Range Status   Specimen Description BLOOD LEFT ANTECUBITAL  Final   Special Requests BOTTLES DRAWN AEROBIC AND ANAEROBIC 5 CC EA  Final   Culture   Final    NO GROWTH 5 DAYS Performed at [facility]    Report Status 08/23/2016 FINAL  Final  Blood culture (routine x 2)     Status: None   Collection Time: 08/18/16  4:28 PM  Result Value Ref Range Status   Specimen Description BLOOD RIGHT HAND  Final   Special Requests BOTTLES DRAWN AEROBIC AND ANAEROBIC 5 CC EA  Final   Culture   Final    NO GROWTH 5 DAYS Performed at [facility]    Report Status 08/23/2016 FINAL  Final     Labs: BNP (last 3 results)  Recent Labs  07/31/16 2107  BNP [id number]*   Basic Metabolic Panel:  Recent Labs Lab 08/19/16 0519 08/20/16 0610 08/21/16 0638 08/22/16 0515 08/23/16 0516  NA 143 141 140 140 141  K 3.8 3.9 3.6 3.8 3.5  CL 109 106 107 110 108  CO2 24 24 25  20* 23  GLUCOSE 90 95 102* 93 100*  BUN 23* 19 15 16 13   CREATININE 1.26* 1.22 1.06 1.33* 0.97  CALCIUM 8.8* 8.9 8.6* 8.5* 8.5*  MG  --  1.6* 1.7 2.0  --    Liver Function Tests:  Recent Labs Lab 08/18/16 1309 08/19/16 0519  AST 17 14*  ALT 17 15*  ALKPHOS 62 59  BILITOT 1.2 1.1  PROT 6.3* 5.6*  ALBUMIN 3.3* 3.1*    Recent Labs Lab 08/18/16 1309  LIPASE 28    Recent Labs Lab 08/18/16 1531  AMMONIA 15   CBC:  Recent Labs Lab 08/18/16 1309 08/19/16 0519 08/20/16 0610 08/21/16 0638 08/22/16 0515 08/23/16 0516 08/23/16 1851  WBC 8.4 8.1 8.8 9.5 10.8* 9.6  --   NEUTROABS 6.0  --   --   --   --   --   --   HGB 10.2* 10.4* 10.9* 10.0* 10.4* 9.8* 9.2*  HCT 32.0* 32.8* 34.7* 31.0* 32.2* 30.7* 28.4*  MCV 87.2 88.2 87.0 86.1 86.1 85.8  --   PLT 311 278 287 287 265 248  --    Cardiac Enzymes: No results for input(s): CKTOTAL, CKMB,  CKMBINDEX, TROPONINI in the last 168 hours. BNP: Invalid input(s): POCBNP CBG: No results for input(s): GLUCAP in the last 168  hours. D-Dimer No results for input(s): DDIMER in the last 72 hours. Hgb A1c No results for input(s): HGBA1C in the last 72 hours. Lipid Profile No results for input(s): CHOL, HDL, LDLCALC, TRIG, CHOLHDL, LDLDIRECT in the last 72 hours. Thyroid function studies No results for input(s): TSH, T4TOTAL, T3FREE, THYROIDAB in the last 72 hours.  Invalid input(s): FREET3 Anemia work up No results for input(s): VITAMINB12, FOLATE, FERRITIN, TIBC, IRON, RETICCTPCT in the last 72 hours. Urinalysis    Component Value Date/Time   COLORURINE AMBER (A) 08/18/2016 1347   APPEARANCEUR CLOUDY (A) 08/18/2016 1347   LABSPEC 1.014 08/18/2016 1347   PHURINE 7.0 08/18/2016 1347   GLUCOSEU NEGATIVE 08/18/2016 1347   HGBUR MODERATE (A) 08/18/2016 1347   BILIRUBINUR NEGATIVE 08/18/2016 1347   KETONESUR NEGATIVE 08/18/2016 1347   PROTEINUR 30 (A) 08/18/2016 1347   UROBILINOGEN 0.2 07/18/2014 1953   NITRITE POSITIVE (A) 08/18/2016 1347   LEUKOCYTESUR LARGE (A) 08/18/2016 1347   Sepsis Labs Invalid input(s): PROCALCITONIN,  WBC,  LACTICIDVEN Microbiology Recent Results (from the past 240 hour(s))  Urine culture     Status: Abnormal   Collection Time: 08/14/16  6:05 PM  Result Value Ref Range Status   Specimen Description URINE, CATHETERIZED  Final   Special Requests NONE  Final   Culture >=100,000 COLONIES/mL PROTEUS MIRABILIS (A)  Final   Report Status 08/17/2016 FINAL  Final   Organism ID, Bacteria PROTEUS MIRABILIS (A)  Final      Susceptibility   Proteus mirabilis - MIC*    AMPICILLIN <=2 SENSITIVE Sensitive     CEFAZOLIN <=4 SENSITIVE Sensitive     CEFTRIAXONE <=1 SENSITIVE Sensitive     CIPROFLOXACIN <=0.25 SENSITIVE Sensitive     GENTAMICIN <=1 SENSITIVE Sensitive     IMIPENEM 2 SENSITIVE Sensitive     NITROFURANTOIN 128 RESISTANT Resistant     TRIMETH/SULFA  <=20 SENSITIVE Sensitive     AMPICILLIN/SULBACTAM <=2 SENSITIVE Sensitive     PIP/TAZO <=4 SENSITIVE Sensitive     * >=100,000 COLONIES/mL PROTEUS MIRABILIS  Urine culture     Status: Abnormal   Collection Time: 08/18/16  1:48 PM  Result Value Ref Range Status   Specimen Description URINE, CATHETERIZED  Final   Special Requests NONE  Final   Culture >=100,000 COLONIES/mL PROTEUS MIRABILIS (A)  Final   Report Status 08/20/2016 FINAL  Final   Organism ID, Bacteria PROTEUS MIRABILIS (A)  Final      Susceptibility   Proteus mirabilis - MIC*    AMPICILLIN <=2 SENSITIVE Sensitive     CEFAZOLIN <=4 SENSITIVE Sensitive     CEFTRIAXONE <=1 SENSITIVE Sensitive     CIPROFLOXACIN <=0.25 SENSITIVE Sensitive     GENTAMICIN <=1 SENSITIVE Sensitive     IMIPENEM 2 SENSITIVE Sensitive     NITROFURANTOIN 128 RESISTANT Resistant     TRIMETH/SULFA <=20 SENSITIVE Sensitive     AMPICILLIN/SULBACTAM <=2 SENSITIVE Sensitive     PIP/TAZO <=4 SENSITIVE Sensitive     * >=100,000 COLONIES/mL PROTEUS MIRABILIS  Blood culture (routine x 2)     Status: None   Collection Time: 08/18/16  4:20 PM  Result Value Ref Range Status   Specimen Description BLOOD LEFT ANTECUBITAL  Final   Special Requests BOTTLES DRAWN AEROBIC AND ANAEROBIC 5 CC EA  Final   Culture   Final    NO GROWTH 5 DAYS Performed at [facility]    Report Status 08/23/2016 FINAL  Final  Blood culture (routine x 2)  Status: None   Collection Time: 08/18/16  4:28 PM  Result Value Ref Range Status   Specimen Description BLOOD RIGHT HAND  Final   Special Requests BOTTLES DRAWN AEROBIC AND ANAEROBIC 5 CC EA  Final   Culture   Final    NO GROWTH 5 DAYS Performed at [facility]    Report Status 08/23/2016 FINAL  Final     Time coordinating discharge: Over 30 minutes  SIGNED:   Hosie Poisson, MD  [facility] 08/24/2016, 1:14 PM Pager   If 7PM-7AM, please contact night-coverage [email or web address] Password [email or web address]

## 2016-08-24 NOTE — Progress Notes (Signed)
D/C Summary faxed to [facility]

## 2016-09-15 DEATH — deceased

## 2017-07-19 IMAGING — US US RENAL
1 series · 3 of 3 positions shown · non-contrast
Comparison: CT of 1 day prior

CLINICAL DATA: Uterine tract infection. Hydronephrosis seen on CT.
Distal left ureteric stone.

EXAM:
RENAL / URINARY TRACT ULTRASOUND COMPLETE

[Series 1: us renal · 0.19mm/px · 3 of 3 slices shown]
[im 1/3]
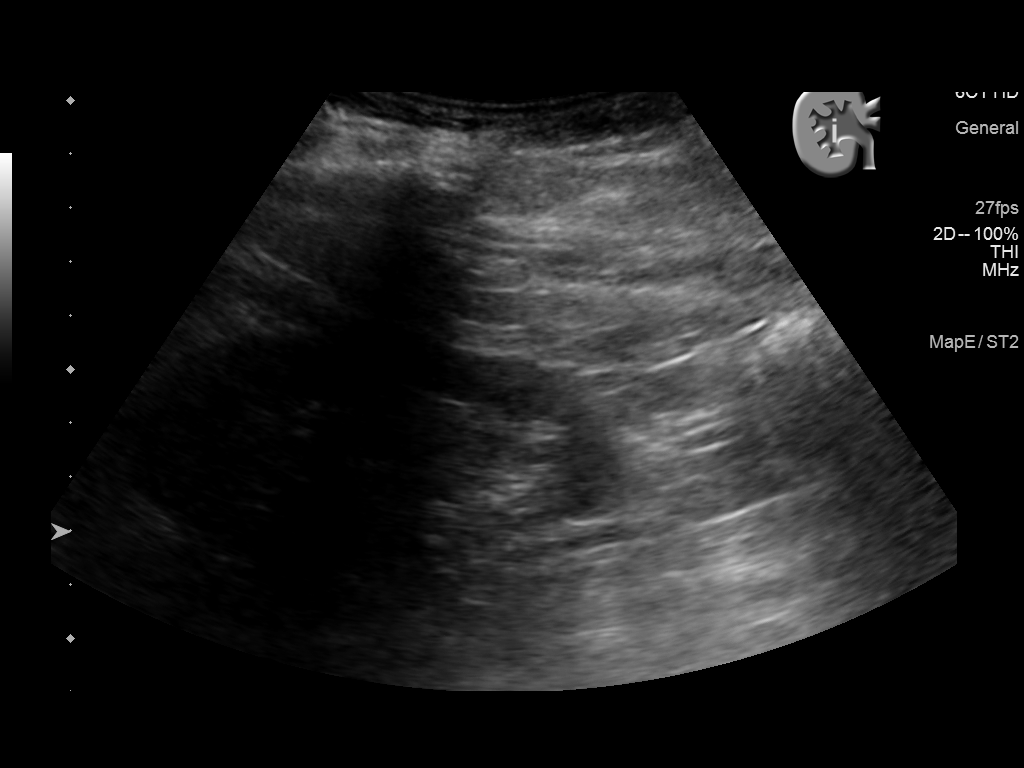
[im 2/3]
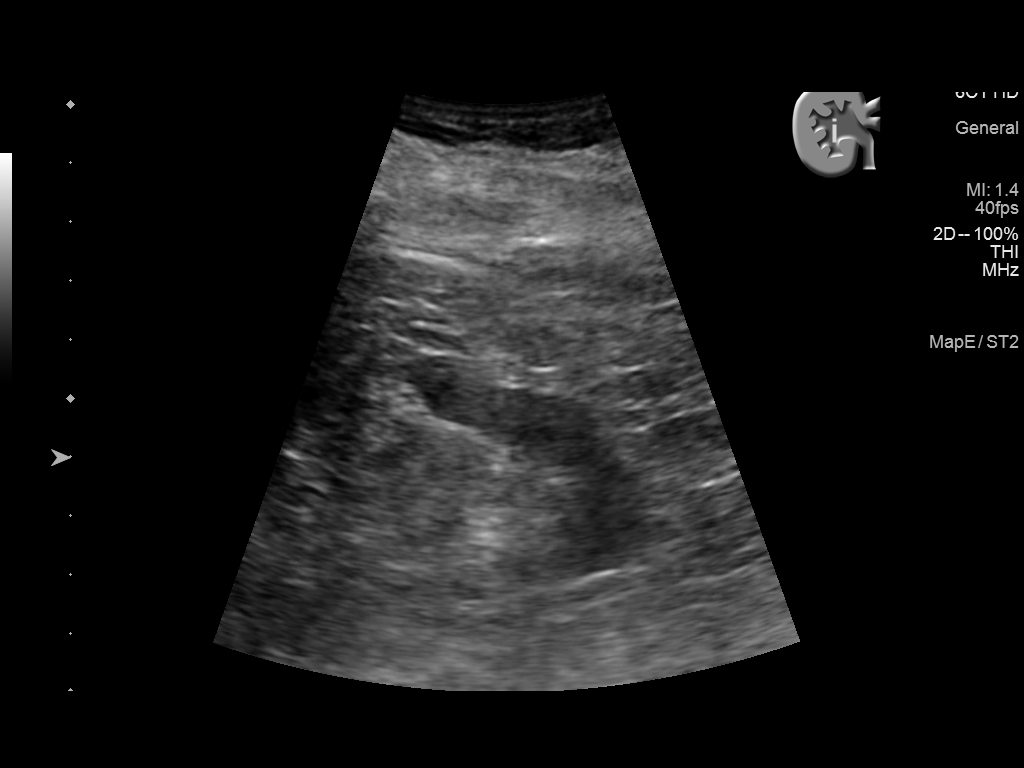
[im 3/3]
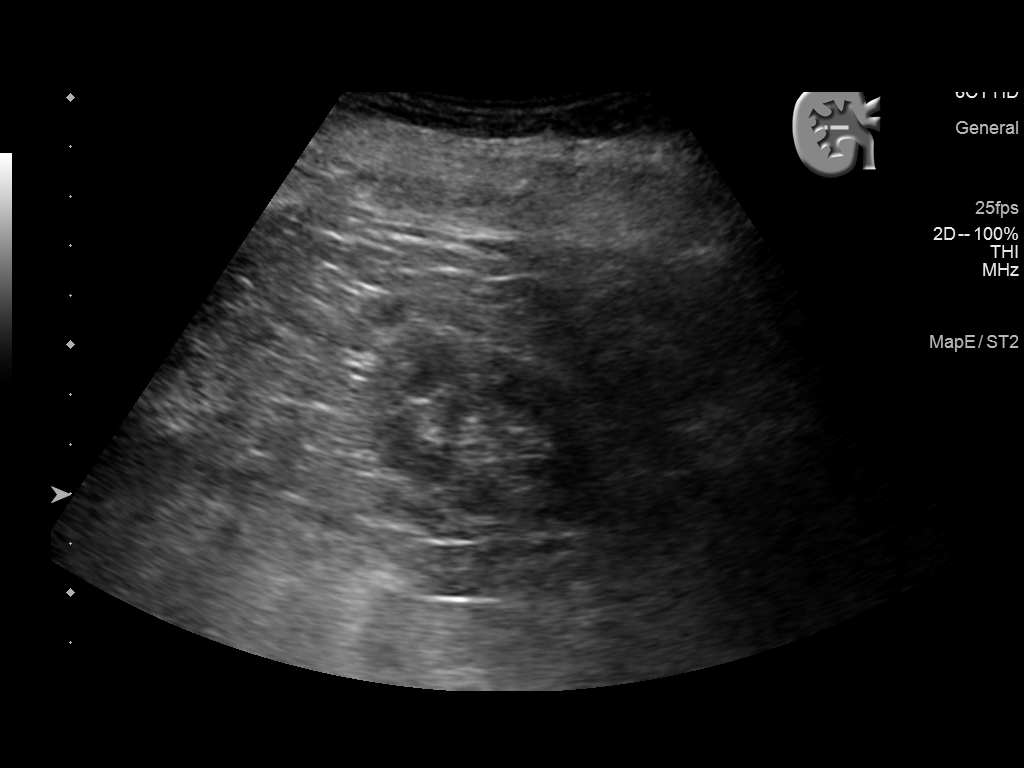

[3 of 3 positions shown; findings below may reference images not displayed]

FINDINGS: Right Kidney:

Length: 10.2 cm. Echogenicity within normal limits. No mass or
hydronephrosis visualized.

Left Kidney:

Length: 10.6 cm. Normal renal cortical thickness for age. Minimal
caliectasis is suspected, similar.

Bladder:

Decompressed around a Foley catheter.
IMPRESSION: Minimal left-sided caliectasis is felt to be similar.

## 2017-07-20 IMAGING — DX DG HAND COMPLETE 3+V*L*
4 series · 5 of 5 positions shown · non-contrast
Comparison: None.

CLINICAL DATA: Left hand pain, swelling, and bruising.

EXAM:
LEFT HAND - COMPLETE 3+ VIEW

[Series 1: hand ap · 0.14mm/px · 2 of 2 slices shown]
[im 1/2]
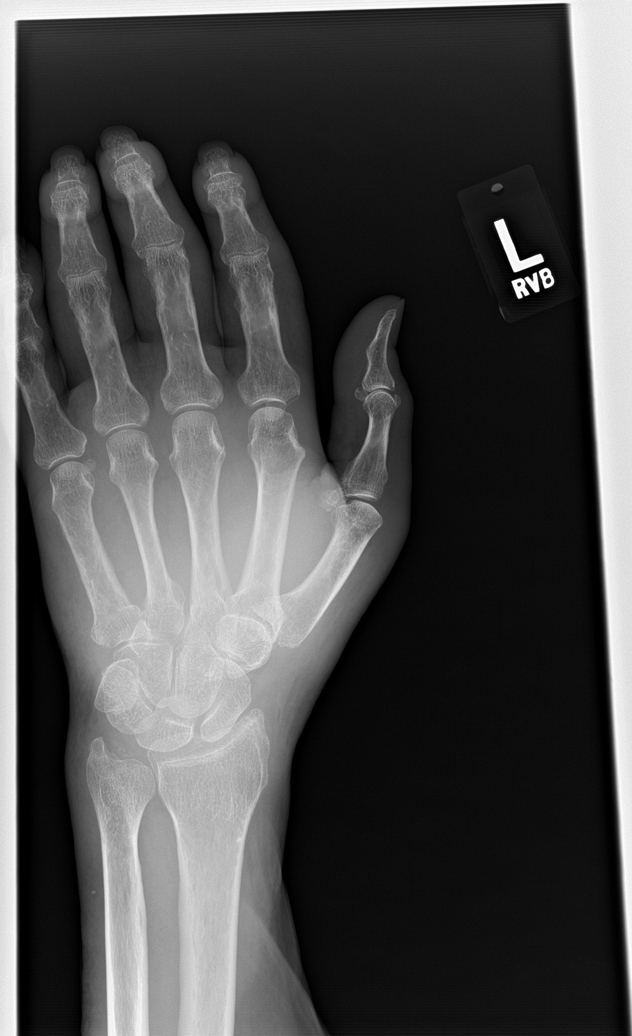
[im 2/2]
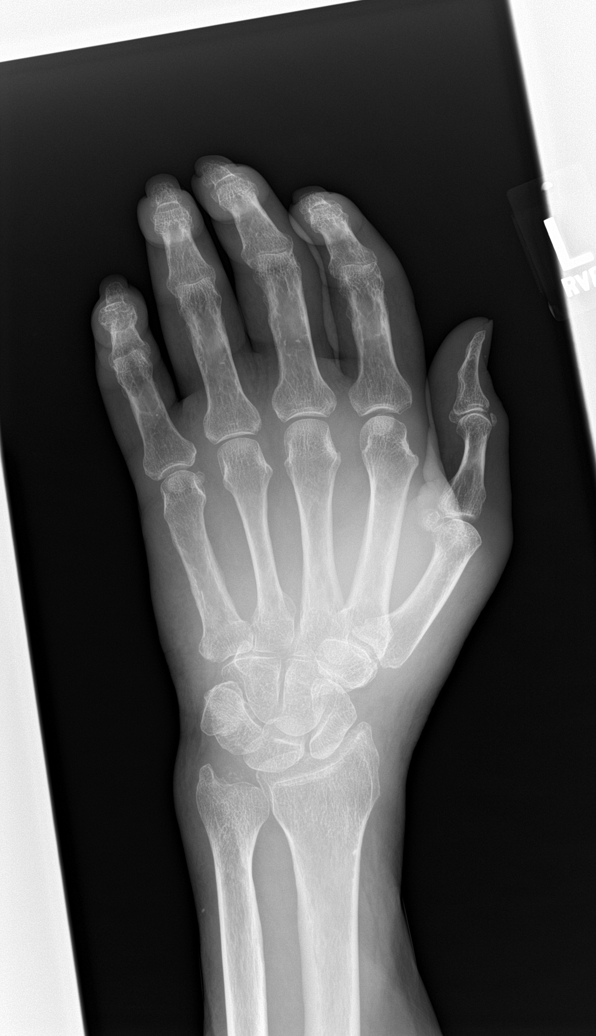

[hand obl]
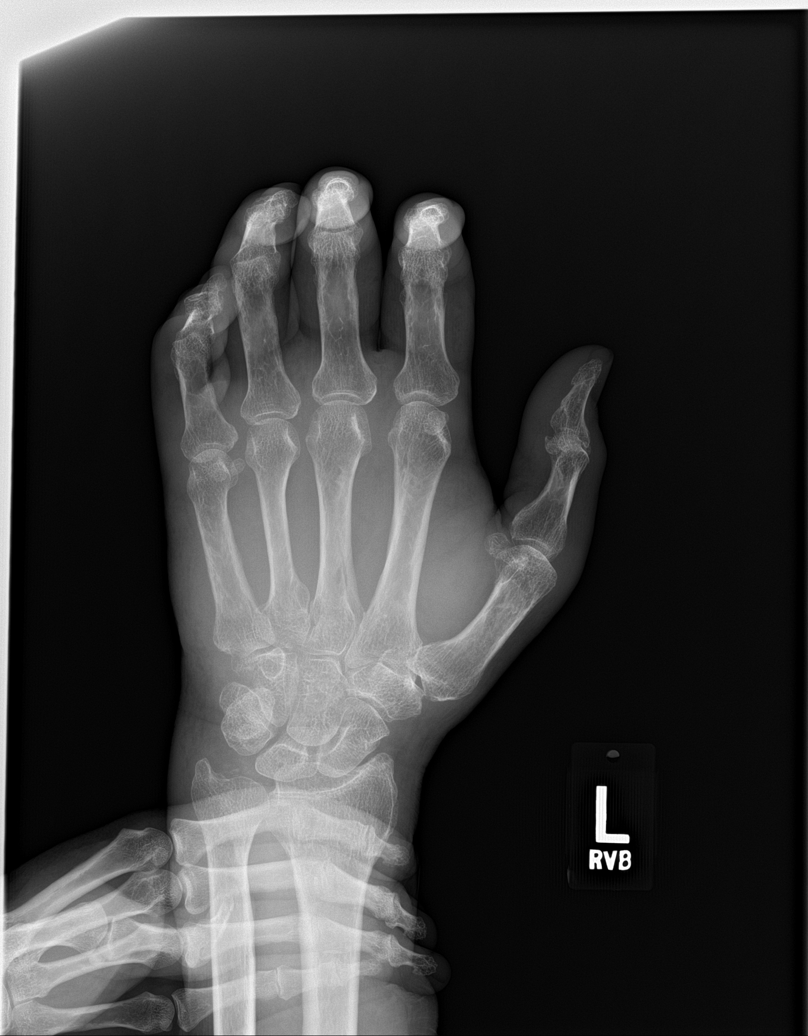

[hand lat (1 of 2)]
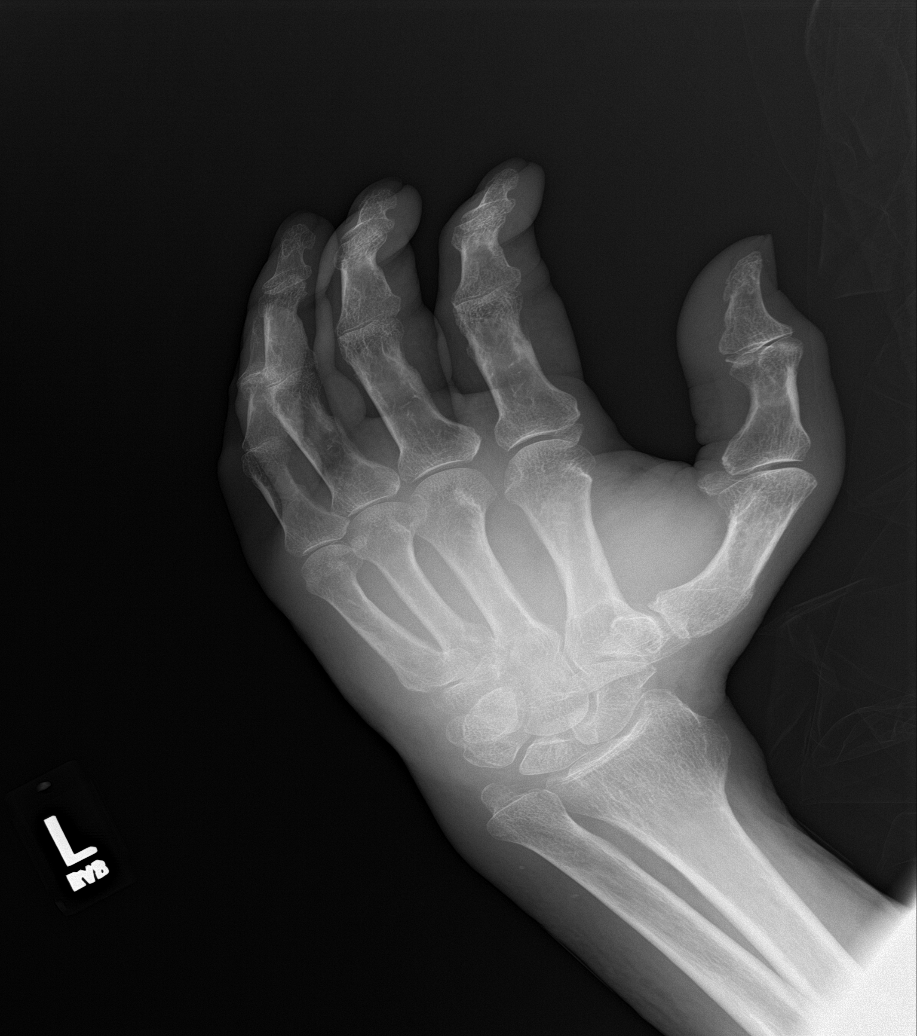

[hand lat (2 of 2)]
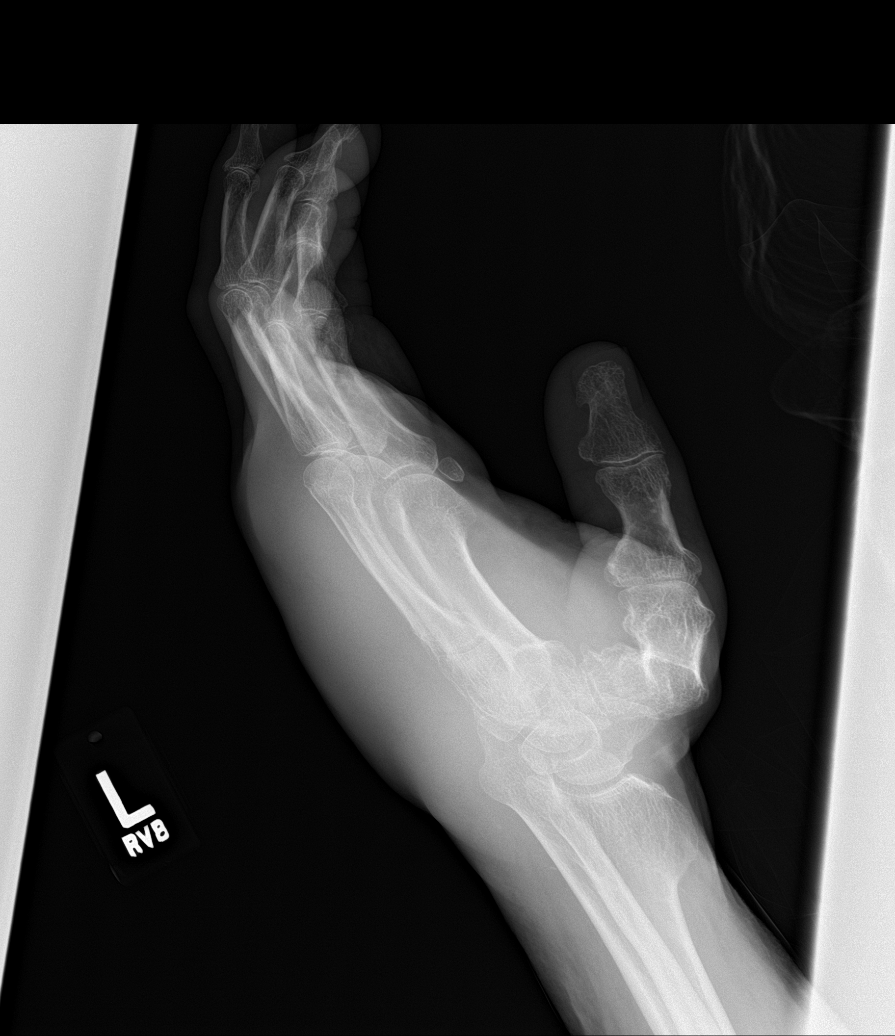

[5 of 5 positions shown; findings below may reference images not displayed]

FINDINGS: Suboptimal positioning due to patient's inability to cooperate and
combativeness.

No evidence of acute fracture or dislocation. Visualization of
fingers is limited due to there partially flexed position.
Osteoarthritis is seen involving the distal interphalangeal joint of
the little finger. Generalized osteopenia noted. No evidence of
osteolysis or periostitis. Soft tissue swelling seen along the
dorsal aspect of the metacarpals.
IMPRESSION: Dorsal soft tissue swelling.  No acute osseous abnormality.
